# Patient Record
Sex: Female | Born: 1960 | Race: White | Hispanic: No | Smoking: Former smoker
Health system: Southern US, Community
[De-identification: ages and names within clinical notes are randomized; demographics above are authoritative.]

## PROBLEM LIST (undated history)

## (undated) DIAGNOSIS — T8859XA Other complications of anesthesia, initial encounter: Secondary | ICD-10-CM

## (undated) DIAGNOSIS — R112 Nausea with vomiting, unspecified: Secondary | ICD-10-CM

## (undated) DIAGNOSIS — M7138 Other bursal cyst, other site: Secondary | ICD-10-CM

## (undated) DIAGNOSIS — M1990 Unspecified osteoarthritis, unspecified site: Secondary | ICD-10-CM

## (undated) DIAGNOSIS — F419 Anxiety disorder, unspecified: Secondary | ICD-10-CM

## (undated) DIAGNOSIS — R748 Abnormal levels of other serum enzymes: Secondary | ICD-10-CM

## (undated) DIAGNOSIS — J302 Other seasonal allergic rhinitis: Secondary | ICD-10-CM

## (undated) DIAGNOSIS — Z9289 Personal history of other medical treatment: Secondary | ICD-10-CM

## (undated) DIAGNOSIS — I10 Essential (primary) hypertension: Secondary | ICD-10-CM

## (undated) DIAGNOSIS — T4145XA Adverse effect of unspecified anesthetic, initial encounter: Secondary | ICD-10-CM

## (undated) DIAGNOSIS — Z9889 Other specified postprocedural states: Secondary | ICD-10-CM

## (undated) HISTORY — DX: Other bursal cyst, other site: M71.38

## (undated) HISTORY — PX: BUNIONECTOMY: SHX129

## (undated) HISTORY — DX: Anxiety disorder, unspecified: F41.9

## (undated) HISTORY — PX: LIPOSUCTION: SHX10

## (undated) HISTORY — PX: TONSILLECTOMY: SUR1361

## (undated) HISTORY — DX: Personal history of other medical treatment: Z92.89

## (undated) HISTORY — PX: JOINT REPLACEMENT: SHX530

## (undated) HISTORY — PX: OTHER SURGICAL HISTORY: SHX169

## (undated) HISTORY — PX: ABDOMINAL HYSTERECTOMY: SHX81

---

## 1987-06-27 HISTORY — PX: KNEE ARTHROSCOPY: SHX127

## 2006-09-24 ENCOUNTER — Ambulatory Visit: Payer: Self-pay | Admitting: Unknown Physician Specialty

## 2006-09-24 ENCOUNTER — Other Ambulatory Visit: Payer: Self-pay

## 2006-09-27 ENCOUNTER — Ambulatory Visit: Payer: Self-pay | Admitting: Unknown Physician Specialty

## 2008-04-03 ENCOUNTER — Emergency Department: Payer: Self-pay | Admitting: Emergency Medicine

## 2008-10-06 ENCOUNTER — Ambulatory Visit: Payer: Self-pay | Admitting: Unknown Physician Specialty

## 2008-11-05 ENCOUNTER — Ambulatory Visit: Payer: Self-pay | Admitting: Unknown Physician Specialty

## 2008-11-19 ENCOUNTER — Ambulatory Visit: Payer: Self-pay | Admitting: Unknown Physician Specialty

## 2009-05-25 ENCOUNTER — Ambulatory Visit: Payer: Self-pay | Admitting: Orthopedic Surgery

## 2009-06-01 ENCOUNTER — Ambulatory Visit: Payer: Self-pay | Admitting: Orthopedic Surgery

## 2009-09-23 ENCOUNTER — Ambulatory Visit: Payer: Self-pay | Admitting: Orthopedic Surgery

## 2009-10-14 ENCOUNTER — Ambulatory Visit: Payer: Self-pay | Admitting: Unknown Physician Specialty

## 2010-11-07 ENCOUNTER — Ambulatory Visit: Payer: Self-pay | Admitting: Unknown Physician Specialty

## 2011-10-09 ENCOUNTER — Ambulatory Visit: Payer: Self-pay | Admitting: Obstetrics and Gynecology

## 2011-11-12 ENCOUNTER — Emergency Department: Payer: Self-pay | Admitting: Emergency Medicine

## 2012-10-14 NOTE — Progress Notes (Signed)
Need orders for 5-6 surgery pre op is 4-30 0900, thanks

## 2012-10-16 NOTE — Progress Notes (Signed)
MATT-  NEED PRE OP ORDERS PLEASE- appt 10/23/12  THANKS

## 2012-10-17 ENCOUNTER — Ambulatory Visit: Payer: Self-pay | Admitting: Obstetrics and Gynecology

## 2012-10-18 ENCOUNTER — Encounter (HOSPITAL_COMMUNITY): Payer: Self-pay | Admitting: Pharmacy Technician

## 2012-10-23 ENCOUNTER — Ambulatory Visit (HOSPITAL_COMMUNITY)
Admission: RE | Admit: 2012-10-23 | Discharge: 2012-10-23 | Disposition: A | Discharge status: Home / Self Care | Payer: Worker's Compensation | Source: Ambulatory Visit | Attending: Orthopedic Surgery | Admitting: Orthopedic Surgery

## 2012-10-23 ENCOUNTER — Encounter (HOSPITAL_COMMUNITY)
Admission: RE | Admit: 2012-10-23 | Discharge: 2012-10-23 | Disposition: A | Discharge status: Home / Self Care | Payer: Worker's Compensation | Source: Ambulatory Visit | Attending: Orthopedic Surgery | Admitting: Orthopedic Surgery

## 2012-10-23 ENCOUNTER — Encounter (HOSPITAL_COMMUNITY): Payer: Self-pay

## 2012-10-23 DIAGNOSIS — Z01818 Encounter for other preprocedural examination: Secondary | ICD-10-CM | POA: Insufficient documentation

## 2012-10-23 DIAGNOSIS — I10 Essential (primary) hypertension: Secondary | ICD-10-CM | POA: Insufficient documentation

## 2012-10-23 HISTORY — DX: Other seasonal allergic rhinitis: J30.2

## 2012-10-23 HISTORY — DX: Unspecified osteoarthritis, unspecified site: M19.90

## 2012-10-23 HISTORY — DX: Other complications of anesthesia, initial encounter: T88.59XA

## 2012-10-23 HISTORY — DX: Adverse effect of unspecified anesthetic, initial encounter: T41.45XA

## 2012-10-23 LAB — CBC
HCT: 38.6 % (ref 36.0–46.0)
Hemoglobin: 13.1 g/dL (ref 12.0–15.0)
MCH: 31.4 pg (ref 26.0–34.0)
MCHC: 33.9 g/dL (ref 30.0–36.0)
MCV: 92.6 fL (ref 78.0–100.0)
Platelets: 226 10*3/uL (ref 150–400)
RBC: 4.17 MIL/uL (ref 3.87–5.11)
RDW: 13.9 % (ref 11.5–15.5)
WBC: 7.4 10*3/uL (ref 4.0–10.5)

## 2012-10-23 LAB — URINALYSIS, ROUTINE W REFLEX MICROSCOPIC
Bilirubin Urine: NEGATIVE
Glucose, UA: NEGATIVE mg/dL
Hgb urine dipstick: NEGATIVE
Ketones, ur: NEGATIVE mg/dL
Leukocytes, UA: NEGATIVE
Nitrite: NEGATIVE
Protein, ur: NEGATIVE mg/dL
Specific Gravity, Urine: 1.008 (ref 1.005–1.030)
Urobilinogen, UA: 0.2 mg/dL (ref 0.0–1.0)
pH: 7 (ref 5.0–8.0)

## 2012-10-23 LAB — BASIC METABOLIC PANEL
BUN: 10 mg/dL (ref 6–23)
CO2: 25 mEq/L (ref 19–32)
Calcium: 8.8 mg/dL (ref 8.4–10.5)
Chloride: 104 mEq/L (ref 96–112)
Creatinine, Ser: 0.66 mg/dL (ref 0.50–1.10)
GFR calc Af Amer: >90 mL/min (ref >90)
GFR calc non Af Amer: >90 mL/min (ref >90)
Glucose, Bld: 92 mg/dL (ref 70–99)
Potassium: 4.5 mEq/L (ref 3.5–5.1)
Sodium: 138 mEq/L (ref 135–145)

## 2012-10-23 LAB — SURGICAL PCR SCREEN
MRSA, PCR: NEGATIVE
Staphylococcus aureus: NEGATIVE

## 2012-10-23 LAB — PROTIME-INR
INR: 0.92 (ref 0.00–1.49)
Prothrombin Time: 12.3 seconds (ref 11.6–15.2)

## 2012-10-23 LAB — ABO/RH: ABO/RH(D): A POS

## 2012-10-23 LAB — APTT: aPTT: 29 seconds (ref 24–37)

## 2012-10-23 NOTE — H&P (Signed)
TOTAL HIP ADMISSION H&P  Patient is admitted for right total hip arthroplasty, anterior approach.  Subjective:  Chief Complaint: Right hip OA / pain  HPI: Carolyn Wood, 52 y.o. female, has a history of pain and functional disability in the right hip(s) due to trauma and patient has failed non-surgical conservative treatments for greater than 12 weeks to include NSAID's and/or analgesics, corticosteriod injections and activity modification.  Onset of symptoms was abrupt starting September 12, 2011 with a fall off some decking while at work with gradually worsening course since that time.The patient noted no past surgery on the right hip(s).  Patient currently rates pain in the right hip at 7 out of 10 with activity. Patient has night pain, worsening of pain with activity and weight bearing, trendelenberg gait, pain that interfers with activities of daily living and pain with passive range of motion. Patient has evidence of periarticular osteophytes by imaging studies. This condition presents safety issues increasing the risk of falls.  There is no current active infection.  Risks, benefits and expectations were discussed with the patient. Patient understand the risks, benefits and expectations and wishes to proceed with surgery.   D/C Plans:   Home with HHPT  Post-op Meds:   Rx given for ASA, Robaxin, Celebrex, Iron, Colace and MiraLax  Tranexamic Acid:   To be given  Decadron:    To be given  FYI:    Nothing to note   Past Medical History  Diagnosis Date  . Hypertension   . Seasonal allergies   . Arthritis   . Complication of anesthesia     combative when waking up    Past Surgical History  Procedure Laterality Date  . Abdominal hysterectomy    . Bunionectomy Bilateral   . Cesarean section    . Knee arthroscopy Right 1989    "dont know what they did"  . Cyst removed      tail bone  . Tonsillectomy    . Liposuction      on neck and chin  . Chin implant      No  prescriptions prior to admission   No Known Allergies  History  Substance Use Topics  . Smoking status: Current Some Day Smoker -- 0.50 packs/day for 20 years    Types: Cigarettes, Cigars  . Smokeless tobacco: Never Used  . Alcohol Use: Yes    No family history on file.   Review of Systems  Constitutional: Negative.   HENT: Negative.   Eyes: Negative.   Respiratory: Negative.   Cardiovascular: Negative.   Gastrointestinal: Negative.   Genitourinary: Negative.   Musculoskeletal: Positive for joint pain.  Skin: Negative.   Neurological: Negative.   Endo/Heme/Allergies: Positive for environmental allergies.  Psychiatric/Behavioral: Negative.     Objective:  Physical Exam  Constitutional: She is oriented to person, place, and time. She appears well-developed and well-nourished.  HENT:  Head: Normocephalic and atraumatic.  Mouth/Throat: Oropharynx is clear and moist.  Eyes: Pupils are equal, round, and reactive to light.  Neck: Neck supple. No JVD present. No tracheal deviation present. No thyromegaly present.  Cardiovascular: Normal rate, regular rhythm, normal heart sounds and intact distal pulses.   Respiratory: Effort normal and breath sounds normal. No stridor. No respiratory distress. She has no wheezes.  GI: Soft. There is no tenderness. There is no guarding.  Musculoskeletal:       Right hip: She exhibits decreased range of motion, decreased strength, tenderness and bony tenderness. She exhibits no swelling, no  deformity and no laceration.  Lymphadenopathy:    She has no cervical adenopathy.  Neurological: She is alert and oriented to person, place, and time.  Skin: Skin is warm and dry.  Psychiatric: She has a normal mood and affect.    Vital signs in last 24 hours: Temp:  [97.9 F (36.6 C)] 97.9 F (36.6 C) (04/30 0943) Pulse Rate:  [70] 70 (04/30 0943) Resp:  [16] 16 (04/30 0943) BP: (133)/(81) 133/81 mmHg (04/30 0943) SpO2:  [98 %] 98 % (04/30  0943) Weight:  [72.576 kg (160 lb)] 72.576 kg (160 lb) (04/30 0943)   Imaging Review Plain radiographs demonstrate moderate degenerative joint disease of the right hip(s). The bone quality appears to be good for age and reported activity level.  Assessment/Plan:  End stage arthritis, right hip(s)  The patient history, physical examination, clinical judgement of the provider and imaging studies are consistent with end stage degenerative joint disease of the right hip(s) and total hip arthroplasty is deemed medically necessary. The treatment options including medical management, injection therapy, arthroscopy and arthroplasty were discussed at length. The risks and benefits of total hip arthroplasty were presented and reviewed. The risks due to aseptic loosening, infection, stiffness, dislocation/subluxation,  thromboembolic complications and other imponderables were discussed.  The patient acknowledged the explanation, agreed to proceed with the plan and consent was signed. Patient is being admitted for inpatient treatment for surgery, pain control, PT, OT, prophylactic antibiotics, VTE prophylaxis, progressive ambulation and ADL's and discharge planning.The patient is planning to be discharged home with home health services.    Anastasio Auerbach Eryck Negron   PAC  10/23/2012, 5:10 PM

## 2012-10-23 NOTE — Patient Instructions (Addendum)
20 Shoni Quijas  10/23/2012   Your procedure is scheduled on: 10/29/12  Report to [facility] at 1030 AM.  Call this number if you have problems the morning of surgery [phone]: [phone]   Remember:   Do not eat food or drink liquids After Midnight.   Do not wear jewelry, make-up or nail polish.  Do not wear lotions, powders, or perfumes. You may wear deodorant.  Do not shave 48 hours prior to surgery. Men may shave face and neck.  Do not bring valuables to the hospital.  Contacts, dentures or bridgework may not be worn into surgery.  Leave suitcase in the car. After surgery it may be brought to your room.  For patients admitted to the hospital, checkout time is 11:00 AM the day of discharge.    Please read over the following fact sheets that you were given: MRSA Information, incentive spirometry fact sheet, blood fact sheet.  Birdie Sons, RN  pre op nurse call if needed [phone]    FAILURE TO FOLLOW THESE INSTRUCTIONS MAY RESULT IN CANCELLATION OF YOUR SURGERY   Patient Signature: ___________________________________________

## 2012-10-23 NOTE — Progress Notes (Signed)
Anesthesia record 06/01/09 on chart, perioperative report 06/01/09 on chart, EKG 10/15/12 on chart, LOV note 10/15/12 Dr. Dossie Arbour on chart

## 2012-10-29 ENCOUNTER — Encounter (HOSPITAL_COMMUNITY)
Admission: RE | Disposition: A | Discharge status: Home / Self Care | Payer: Self-pay | Source: Ambulatory Visit | Attending: Orthopedic Surgery

## 2012-10-29 ENCOUNTER — Encounter (HOSPITAL_COMMUNITY): Payer: Self-pay | Admitting: *Deleted

## 2012-10-29 ENCOUNTER — Ambulatory Visit (HOSPITAL_COMMUNITY): Payer: Worker's Compensation

## 2012-10-29 ENCOUNTER — Encounter (HOSPITAL_COMMUNITY): Payer: Self-pay | Admitting: Anesthesiology

## 2012-10-29 ENCOUNTER — Inpatient Hospital Stay (HOSPITAL_COMMUNITY)
Admission: RE | Admit: 2012-10-29 | Discharge: 2012-10-30 | DRG: 470 | Disposition: A | Discharge status: Home / Self Care | Payer: Worker's Compensation | Source: Ambulatory Visit | Attending: Orthopedic Surgery | Admitting: Orthopedic Surgery

## 2012-10-29 ENCOUNTER — Ambulatory Visit (HOSPITAL_COMMUNITY): Payer: Worker's Compensation | Admitting: Anesthesiology

## 2012-10-29 DIAGNOSIS — F17200 Nicotine dependence, unspecified, uncomplicated: Secondary | ICD-10-CM | POA: Diagnosis present

## 2012-10-29 DIAGNOSIS — I10 Essential (primary) hypertension: Secondary | ICD-10-CM | POA: Diagnosis present

## 2012-10-29 DIAGNOSIS — D62 Acute posthemorrhagic anemia: Secondary | ICD-10-CM | POA: Diagnosis not present

## 2012-10-29 DIAGNOSIS — M1610 Unilateral primary osteoarthritis, unspecified hip: Principal | ICD-10-CM | POA: Diagnosis present

## 2012-10-29 DIAGNOSIS — M76899 Other specified enthesopathies of unspecified lower limb, excluding foot: Secondary | ICD-10-CM | POA: Diagnosis present

## 2012-10-29 DIAGNOSIS — W1789XA Other fall from one level to another, initial encounter: Secondary | ICD-10-CM | POA: Diagnosis present

## 2012-10-29 DIAGNOSIS — Y9289 Other specified places as the place of occurrence of the external cause: Secondary | ICD-10-CM

## 2012-10-29 DIAGNOSIS — D500 Iron deficiency anemia secondary to blood loss (chronic): Secondary | ICD-10-CM

## 2012-10-29 DIAGNOSIS — Z96649 Presence of unspecified artificial hip joint: Secondary | ICD-10-CM

## 2012-10-29 DIAGNOSIS — Y990 Civilian activity done for income or pay: Secondary | ICD-10-CM

## 2012-10-29 DIAGNOSIS — M169 Osteoarthritis of hip, unspecified: Principal | ICD-10-CM | POA: Diagnosis present

## 2012-10-29 HISTORY — PX: TOTAL HIP ARTHROPLASTY: SHX124

## 2012-10-29 LAB — TYPE AND SCREEN
ABO/RH(D): A POS
Antibody Screen: NEGATIVE

## 2012-10-29 SURGERY — ARTHROPLASTY, HIP, TOTAL, ANTERIOR APPROACH
Anesthesia: General | Site: Hip | Laterality: Right | Wound class: Clean

## 2012-10-29 MED ORDER — HYDROMORPHONE HCL PF 1 MG/ML IJ SOLN
INTRAMUSCULAR | Status: DC | PRN
  Administered 2012-10-29 (×2): 1 mg via INTRAVENOUS

## 2012-10-29 MED ORDER — PHENYLEPHRINE HCL 1 % NA SOLN
1.0000 [drp] | NASAL | Status: DC | PRN
  Filled 2012-10-29: qty 15

## 2012-10-29 MED ORDER — MENTHOL 3 MG MT LOZG: 1.0000 | LOZENGE | OROMUCOSAL | Status: DC | PRN

## 2012-10-29 MED ORDER — HYDROMORPHONE HCL PF 1 MG/ML IJ SOLN
INTRAMUSCULAR | Status: AC
  Filled 2012-10-29: qty 1

## 2012-10-29 MED ORDER — FERROUS SULFATE 325 (65 FE) MG PO TABS
325.0000 mg | ORAL_TABLET | Freq: Three times a day (TID) | ORAL | Status: DC
  Administered 2012-10-30: 325 mg via ORAL
  Filled 2012-10-29 (×5): qty 1

## 2012-10-29 MED ORDER — DEXAMETHASONE SODIUM PHOSPHATE 10 MG/ML IJ SOLN
10.0000 mg | Freq: Once | INTRAMUSCULAR | Status: DC
  Filled 2012-10-29: qty 1

## 2012-10-29 MED ORDER — HYDROMORPHONE HCL PF 1 MG/ML IJ SOLN: 0.5000 mg | INTRAMUSCULAR | Status: DC | PRN

## 2012-10-29 MED ORDER — CEFAZOLIN SODIUM-DEXTROSE 2-3 GM-% IV SOLR
2.0000 g | Freq: Four times a day (QID) | INTRAVENOUS | Status: AC
  Administered 2012-10-29 – 2012-10-30 (×2): 2 g via INTRAVENOUS
  Filled 2012-10-29 (×2): qty 50

## 2012-10-29 MED ORDER — METOCLOPRAMIDE HCL 5 MG/ML IJ SOLN: 5.0000 mg | Freq: Three times a day (TID) | INTRAMUSCULAR | Status: DC | PRN

## 2012-10-29 MED ORDER — METHOCARBAMOL 500 MG PO TABS: 500.0000 mg | ORAL_TABLET | Freq: Four times a day (QID) | ORAL | Status: DC | PRN

## 2012-10-29 MED ORDER — FENTANYL CITRATE 0.05 MG/ML IJ SOLN
INTRAMUSCULAR | Status: DC | PRN
  Administered 2012-10-29 (×2): 50 ug via INTRAVENOUS
  Administered 2012-10-29: 100 ug via INTRAVENOUS
  Administered 2012-10-29: 50 ug via INTRAVENOUS

## 2012-10-29 MED ORDER — PROMETHAZINE HCL 25 MG/ML IJ SOLN
INTRAMUSCULAR | Status: AC
  Filled 2012-10-29: qty 1

## 2012-10-29 MED ORDER — DOCUSATE SODIUM 100 MG PO CAPS
100.0000 mg | ORAL_CAPSULE | Freq: Two times a day (BID) | ORAL | Status: DC
  Administered 2012-10-29 – 2012-10-30 (×2): 100 mg via ORAL

## 2012-10-29 MED ORDER — SODIUM CHLORIDE 0.9 % IV SOLN
100.0000 mL/h | INTRAVENOUS | Status: DC
  Administered 2012-10-29: 100 mL/h via INTRAVENOUS
  Filled 2012-10-29 (×5): qty 1000

## 2012-10-29 MED ORDER — LACTATED RINGERS IV SOLN: INTRAVENOUS | Status: DC

## 2012-10-29 MED ORDER — ZOLPIDEM TARTRATE 5 MG PO TABS: 5.0000 mg | ORAL_TABLET | Freq: Every evening | ORAL | Status: DC | PRN

## 2012-10-29 MED ORDER — PROPOFOL 10 MG/ML IV BOLUS
INTRAVENOUS | Status: DC | PRN
  Administered 2012-10-29: 160 mg via INTRAVENOUS
  Administered 2012-10-29: 40 mg via INTRAVENOUS

## 2012-10-29 MED ORDER — ASPIRIN EC 325 MG PO TBEC
325.0000 mg | DELAYED_RELEASE_TABLET | Freq: Two times a day (BID) | ORAL | Status: DC
  Administered 2012-10-30: 325 mg via ORAL
  Filled 2012-10-29 (×2): qty 1

## 2012-10-29 MED ORDER — LACTATED RINGERS IV SOLN
INTRAVENOUS | Status: DC
  Administered 2012-10-29 (×2): via INTRAVENOUS
  Administered 2012-10-29: 1000 mL via INTRAVENOUS
  Administered 2012-10-29: 16:00:00 via INTRAVENOUS

## 2012-10-29 MED ORDER — LACTATED RINGERS IV SOLN: INTRAVENOUS | Status: DC | PRN

## 2012-10-29 MED ORDER — CLONAZEPAM 0.5 MG PO TABS: 0.2500 mg | ORAL_TABLET | Freq: Two times a day (BID) | ORAL | Status: DC | PRN

## 2012-10-29 MED ORDER — TRANEXAMIC ACID 100 MG/ML IV SOLN
1000.0000 mg | Freq: Once | INTRAVENOUS | Status: AC
  Administered 2012-10-29: 1000 mg via INTRAVENOUS
  Filled 2012-10-29: qty 10

## 2012-10-29 MED ORDER — CEFAZOLIN SODIUM-DEXTROSE 2-3 GM-% IV SOLR
2.0000 g | INTRAVENOUS | Status: AC
  Administered 2012-10-29: 2 g via INTRAVENOUS

## 2012-10-29 MED ORDER — BISACODYL 10 MG RE SUPP: 10.0000 mg | Freq: Every day | RECTAL | Status: DC | PRN

## 2012-10-29 MED ORDER — 0.9 % SODIUM CHLORIDE (POUR BTL) OPTIME
TOPICAL | Status: DC | PRN
  Administered 2012-10-29: 1000 mL

## 2012-10-29 MED ORDER — ONDANSETRON HCL 4 MG/2ML IJ SOLN
4.0000 mg | Freq: Four times a day (QID) | INTRAMUSCULAR | Status: DC | PRN
  Administered 2012-10-29: 4 mg via INTRAVENOUS
  Filled 2012-10-29: qty 2

## 2012-10-29 MED ORDER — LIDOCAINE HCL (CARDIAC) 20 MG/ML IV SOLN
INTRAVENOUS | Status: DC | PRN
  Administered 2012-10-29: 60 mg via INTRAVENOUS

## 2012-10-29 MED ORDER — ACETAMINOPHEN 10 MG/ML IV SOLN
INTRAVENOUS | Status: AC
  Filled 2012-10-29: qty 100

## 2012-10-29 MED ORDER — PROMETHAZINE HCL 25 MG/ML IJ SOLN
6.2500 mg | INTRAMUSCULAR | Status: DC | PRN
  Administered 2012-10-29: 6.25 mg via INTRAVENOUS

## 2012-10-29 MED ORDER — DIPHENHYDRAMINE HCL 25 MG PO CAPS: 25.0000 mg | ORAL_CAPSULE | Freq: Four times a day (QID) | ORAL | Status: DC | PRN

## 2012-10-29 MED ORDER — ROCURONIUM BROMIDE 100 MG/10ML IV SOLN
INTRAVENOUS | Status: DC | PRN
  Administered 2012-10-29: 20 mg via INTRAVENOUS
  Administered 2012-10-29: 50 mg via INTRAVENOUS

## 2012-10-29 MED ORDER — PHENYLEPHRINE HCL 10 MG/ML IJ SOLN
INTRAMUSCULAR | Status: DC | PRN
  Administered 2012-10-29 (×5): 40 ug via INTRAVENOUS

## 2012-10-29 MED ORDER — METOCLOPRAMIDE HCL 5 MG PO TABS
5.0000 mg | ORAL_TABLET | Freq: Three times a day (TID) | ORAL | Status: DC | PRN
  Filled 2012-10-29: qty 2

## 2012-10-29 MED ORDER — HYDROCODONE-ACETAMINOPHEN 7.5-325 MG PO TABS
1.0000 | ORAL_TABLET | ORAL | Status: DC
  Administered 2012-10-29 – 2012-10-30 (×3): 2 via ORAL
  Administered 2012-10-30: 1 via ORAL
  Administered 2012-10-30: 2 via ORAL
  Filled 2012-10-29 (×5): qty 2

## 2012-10-29 MED ORDER — ALUM & MAG HYDROXIDE-SIMETH 200-200-20 MG/5ML PO SUSP: 30.0000 mL | ORAL | Status: DC | PRN

## 2012-10-29 MED ORDER — NEOSTIGMINE METHYLSULFATE 1 MG/ML IJ SOLN
INTRAMUSCULAR | Status: DC | PRN
  Administered 2012-10-29: 2 mg via INTRAVENOUS

## 2012-10-29 MED ORDER — METHOCARBAMOL 100 MG/ML IJ SOLN
500.0000 mg | Freq: Four times a day (QID) | INTRAVENOUS | Status: DC | PRN
  Administered 2012-10-29: 500 mg via INTRAVENOUS
  Filled 2012-10-29: qty 5

## 2012-10-29 MED ORDER — CELECOXIB 200 MG PO CAPS
200.0000 mg | ORAL_CAPSULE | Freq: Two times a day (BID) | ORAL | Status: DC
  Administered 2012-10-29 – 2012-10-30 (×2): 200 mg via ORAL
  Filled 2012-10-29 (×3): qty 1

## 2012-10-29 MED ORDER — CEFAZOLIN SODIUM-DEXTROSE 2-3 GM-% IV SOLR
INTRAVENOUS | Status: AC
  Filled 2012-10-29: qty 50

## 2012-10-29 MED ORDER — FLEET ENEMA 7-19 GM/118ML RE ENEM: 1.0000 | ENEMA | Freq: Once | RECTAL | Status: AC | PRN

## 2012-10-29 MED ORDER — POLYETHYLENE GLYCOL 3350 17 G PO PACK
17.0000 g | PACK | Freq: Two times a day (BID) | ORAL | Status: DC
  Administered 2012-10-29 – 2012-10-30 (×2): 17 g via ORAL

## 2012-10-29 MED ORDER — GLYCOPYRROLATE 0.2 MG/ML IJ SOLN
INTRAMUSCULAR | Status: DC | PRN
  Administered 2012-10-29 (×2): 0.2 mg via INTRAVENOUS

## 2012-10-29 MED ORDER — MIDAZOLAM HCL 5 MG/5ML IJ SOLN
INTRAMUSCULAR | Status: DC | PRN
  Administered 2012-10-29: 2 mg via INTRAVENOUS

## 2012-10-29 MED ORDER — HYDROMORPHONE HCL PF 1 MG/ML IJ SOLN
0.2500 mg | INTRAMUSCULAR | Status: DC | PRN
Start: 2012-10-29 — End: 2012-10-29
  Administered 2012-10-29 (×2): 0.5 mg via INTRAVENOUS

## 2012-10-29 MED ORDER — ONDANSETRON HCL 4 MG PO TABS: 4.0000 mg | ORAL_TABLET | Freq: Four times a day (QID) | ORAL | Status: DC | PRN

## 2012-10-29 MED ORDER — DEXAMETHASONE SODIUM PHOSPHATE 10 MG/ML IJ SOLN
10.0000 mg | Freq: Once | INTRAMUSCULAR | Status: AC
  Administered 2012-10-29: 10 mg via INTRAVENOUS

## 2012-10-29 MED ORDER — ONDANSETRON HCL 4 MG/2ML IJ SOLN
INTRAMUSCULAR | Status: DC | PRN
  Administered 2012-10-29: 4 mg via INTRAVENOUS

## 2012-10-29 MED ORDER — ACETAMINOPHEN 10 MG/ML IV SOLN
INTRAVENOUS | Status: DC | PRN
  Administered 2012-10-29: 1000 mg via INTRAVENOUS

## 2012-10-29 MED ORDER — PHENOL 1.4 % MT LIQD: 1.0000 | OROMUCOSAL | Status: DC | PRN

## 2012-10-29 SURGICAL SUPPLY — 38 items
BAG ZIPLOCK 12X15 (MISCELLANEOUS) ×4 IMPLANT
BLADE SAW SGTL 18X1.27X75 (BLADE) ×2 IMPLANT
CLOTH BEACON ORANGE TIMEOUT ST (SAFETY) ×2 IMPLANT
DERMABOND ADVANCED (GAUZE/BANDAGES/DRESSINGS) ×1
DERMABOND ADVANCED .7 DNX12 (GAUZE/BANDAGES/DRESSINGS) ×1 IMPLANT
DRAPE C-ARM 42X72 X-RAY (DRAPES) ×2 IMPLANT
DRAPE STERI IOBAN 125X83 (DRAPES) ×2 IMPLANT
DRAPE U-SHAPE 47X51 STRL (DRAPES) ×6 IMPLANT
DRSG AQUACEL AG ADV 3.5X10 (GAUZE/BANDAGES/DRESSINGS) ×2 IMPLANT
DRSG TEGADERM 4X4.75 (GAUZE/BANDAGES/DRESSINGS) ×2 IMPLANT
DURAPREP 26ML APPLICATOR (WOUND CARE) ×2 IMPLANT
ELECT BLADE TIP CTD 4 INCH (ELECTRODE) ×2 IMPLANT
ELECT REM PT RETURN 9FT ADLT (ELECTROSURGICAL) ×2
ELECTRODE REM PT RTRN 9FT ADLT (ELECTROSURGICAL) ×1 IMPLANT
EVACUATOR 1/8 PVC DRAIN (DRAIN) IMPLANT
FACESHIELD LNG OPTICON STERILE (SAFETY) ×8 IMPLANT
GAUZE SPONGE 2X2 8PLY STRL LF (GAUZE/BANDAGES/DRESSINGS) ×1 IMPLANT
GLOVE BIOGEL PI IND STRL 7.5 (GLOVE) ×1 IMPLANT
GLOVE BIOGEL PI IND STRL 8 (GLOVE) ×1 IMPLANT
GLOVE BIOGEL PI INDICATOR 7.5 (GLOVE) ×1
GLOVE BIOGEL PI INDICATOR 8 (GLOVE) ×1
GLOVE ECLIPSE 8.0 STRL XLNG CF (GLOVE) ×2 IMPLANT
GLOVE ORTHO TXT STRL SZ7.5 (GLOVE) ×4 IMPLANT
GOWN BRE IMP PREV XXLGXLNG (GOWN DISPOSABLE) ×2 IMPLANT
GOWN STRL NON-REIN LRG LVL3 (GOWN DISPOSABLE) ×2 IMPLANT
KIT BASIN OR (CUSTOM PROCEDURE TRAY) ×2 IMPLANT
PACK TOTAL JOINT (CUSTOM PROCEDURE TRAY) ×2 IMPLANT
PADDING CAST COTTON 6X4 STRL (CAST SUPPLIES) ×2 IMPLANT
SPONGE GAUZE 2X2 STER 10/PKG (GAUZE/BANDAGES/DRESSINGS) ×1
SUCTION FRAZIER 12FR DISP (SUCTIONS) ×2 IMPLANT
SUT MNCRL AB 4-0 PS2 18 (SUTURE) ×2 IMPLANT
SUT VIC AB 1 CT1 36 (SUTURE) ×8 IMPLANT
SUT VIC AB 2-0 CT1 27 (SUTURE) ×2
SUT VIC AB 2-0 CT1 TAPERPNT 27 (SUTURE) ×2 IMPLANT
SUT VLOC 180 0 24IN GS25 (SUTURE) ×2 IMPLANT
TOWEL OR 17X26 10 PK STRL BLUE (TOWEL DISPOSABLE) ×4 IMPLANT
TRAY FOLEY CATH 14FRSI W/METER (CATHETERS) ×2 IMPLANT
WATER STERILE IRR 1500ML POUR (IV SOLUTION) ×4 IMPLANT

## 2012-10-29 NOTE — Transfer of Care (Signed)
Immediate Anesthesia Transfer of Care Note  [name]  Procedure(s) Performed: Procedure(s): RIGHT TOTAL HIP ARTHROPLASTY ANTERIOR APPROACH (Right)  Patient Location: PACU  Anesthesia Type:General  Level of Consciousness: awake, alert  and oriented  Airway & Oxygen Therapy: Patient Spontanous Breathing and Patient connected to face mask oxygen  Post-op Assessment: Report given to PACU RN, Post -op Vital signs reviewed and stable and Patient moving all extremities X 4  Post vital signs: Reviewed and stable  Complications: No apparent anesthesia complications

## 2012-10-29 NOTE — Care Management Note (Addendum)
    Page 1 of 2   10/30/2012     1:46:59 PM   CARE MANAGEMENT NOTE 10/30/2012  Patient:  [facility]   Account Number:  [id number]  Date Initiated:  10/29/2012  Documentation initiated by:  Colleen Can  Subjective/Objective Assessment:   DX TOTAL HIP REPLACEMNT-RT-ANTERIOR APPROACH  Worker's cpm [id number]  doi-11/12/2011  Delaney Meigs OZHYQM-[phone]     Action/Plan:   CM WILL FOLLOW; PT PENDING   Anticipated DC Date:  11/01/2012   Anticipated DC Plan:  HOME W HOME HEALTH SERVICES      DC Planning Services  CM consult      PAC Choice  DURABLE MEDICAL EQUIPMENT  HOME HEALTH   Choice offered to / List presented to:  C-1 Patient   DME arranged  WALKER - ROLLING      DME agency  OTHER - SEE NOTE     HH arranged  HH-2 PT      HH agency  OTHER - SEE NOTE   Status of service:  Completed, signed off Medicare Important Message given?   (If response is "NO", the following Medicare IM given date fields will be blank) Date Medicare IM given:   Date Additional Medicare IM given:    Discharge Disposition:  HOME W HOME HEALTH SERVICES  Per UR Regulation:  Reviewed for med. necessity/level of care/duration of stay  If discussed at Long Length of Stay Meetings, dates discussed:    Comments:  10/30/2012 Colleen Can BSN RN CCM [phone] No return call has been received from [place]-adjuster. left msg. Pt is requesting to leave, states she can borrow walker from relative. Cm will continue to try to reach adjuster to advise of pt's H and dme needs. CM will contact pt and advise. tct made to toll free customer service [phone]; spoke with Rosanne Ashing who was advised of pt's need for HHpt and RW. Advised that orders were stat. Request was made for orders to be sent to fax-[phone], face sheet, op note and H&P also sent. Customer service-Jim states patient would be called after services-HH/DME setup. Pt's home address and phone number  verified.  10/30/2012 Colleen Can BSN RN CCM [phone] CM spoke with patient and current plans are for patient to return to her home in [place] [facility] where spouse will be caregiver. She states this is a Freight forwarder. Pt will require HHpt and RW. TCT made to [facility]-claims adjuster-[phone] [phone]-msg left on voice mail REQUESTING DME AND HH NEEDS. CM WILL FOLLOW.

## 2012-10-29 NOTE — Preoperative (Signed)
Beta Blockers   Reason not to administer Beta Blockers:Not Applicable 

## 2012-10-29 NOTE — Op Note (Signed)
NAME:  Carolyn Wood                ACCOUNT NO.: [id number]      MEDICAL RECORD NO.: [id number]      FACILITY:  [facility]      PHYSICIAN:  Durene Romans D  DATE OF BIRTH:  10-Jan-1961     DATE OF PROCEDURE:  10/29/2012                                 OPERATIVE REPORT         PREOPERATIVE DIAGNOSIS: Right  hip osteoarthritis.      POSTOPERATIVE DIAGNOSIS:  Right hip osteoarthritis.      PROCEDURE:  Right total hip replacement through an anterior approach   utilizing DePuy THR system, component size 52mm pinnacle cup, a size 36+4 neutral   Altrex liner, a size 3 Hi Tri Lock stem with a 36+1.5 delta ceramic   ball.      SURGEON:  Madlyn Frankel. Charlann Boxer, M.D.      ASSISTANT:  Lanney Gins, PA-C     ANESTHESIA:  General.      SPECIMENS:  None.      COMPLICATIONS:  None.      BLOOD LOSS:  250 cc     DRAINS:  One Hemovac.      INDICATION OF THE PROCEDURE:  Carolyn Wood is a 51 y.o. female who had   presented to office for evaluation of right hip pain.  Radiographs revealed   progressive degenerative changes with bone-on-bone   articulation to the  hip joint.  The patient had painful limited range of   motion significantly affecting their overall quality of life.  The patient was failing to    respond to conservative measures, and at this point was ready   to proceed with more definitive measures.  The patient has noted progressive   degenerative changes in his hip, progressive problems and dysfunction   with regarding the hip prior to surgery.  Consent was obtained for   benefit of pain relief.  Specific risk of infection, DVT, component   failure, dislocation, need for revision surgery, as well discussion of   the anterior versus posterior approach were reviewed.  Consent was   obtained for benefit of anterior pain relief through an anterior   approach.      PROCEDURE IN DETAIL:  The patient was brought to operative theater.   Once adequate  anesthesia, preoperative antibiotics, 2gm Ancef administered.   The patient was positioned supine on the OSI Hanna table.  Once adequate   padding of boney process was carried out, we had predraped out the hip, and  used fluoroscopy to confirm orientation of the pelvis and position.      The right hip was then prepped and draped from proximal iliac crest to   mid thigh with shower curtain technique.      Time-out was performed identifying the patient, planned procedure, and   extremity.     An incision was then made 2 cm distal and lateral to the   anterior superior iliac spine extending over the orientation of the   tensor fascia lata muscle and sharp dissection was carried down to the   fascia of the muscle and protractor placed in the soft tissues.      The fascia was then incised.  The muscle belly was identified and swept  laterally and retractor placed along the superior neck.  Following   cauterization of the circumflex vessels and removing some pericapsular   fat, a second cobra retractor was placed on the inferior neck.  A third   retractor was placed on the anterior acetabulum after elevating the   anterior rectus.  A L-capsulotomy was along the line of the   superior neck to the trochanteric fossa, then extended proximally and   distally.  Tag sutures were placed and the retractors were then placed   intracapsular.  We then identified the trochanteric fossa and   orientation of my neck cut, confirmed this radiographically   and then made a neck osteotomy with the femur on traction.  The femoral   head was removed without difficulty or complication.  Traction was let   off and retractors were placed posterior and anterior around the   acetabulum.      The labrum and foveal tissue were debrided.  I began reaming with a 47mm   reamer and reamed up to 51mm reamer with good bony bed preparation and a 52   cup was chosen.  The final 52mm Pinnacle cup was then impacted under  fluoroscopy  to confirm the depth of penetration and orientation with respect to   abduction.  A screw was placed followed by the hole eliminator.  The final   36+4 neutral Altrex liner was impacted with good visualized rim fit.  The cup was positioned anatomically within the acetabular portion of the pelvis.      At this point, the femur was rolled at 80 degrees.  Further capsule was   released off the inferior aspect of the femoral neck.  I then   released the superior capsule proximally.  The hook was placed laterally   along the femur and elevated manually and held in position with the bed   hook.  The leg was then extended and adducted with the leg rolled to 100   degrees of external rotation.  Once the proximal femur was fully   exposed, I used a box osteotome to set orientation.  I then began   broaching with the starting chili pepper broach and passed this by hand and then broached up to 3.  With the 3 broach in place I chose a high offset neck and did several trial reductions and readjustments to match leg length and offset.  With the 3 broach basically sitting at the level of the lesser trochanter with a high offset neck and the 36+1.5 ball the offset was appropriate, leg lengths   appeared to be equal, confirmed radiographically.   Given these findings, I went ahead and dislocated the hip, repositioned all   retractors and positioned the right hip in the extended and abducted position.  The final 3HiTri Lock stem was   chosen and it was impacted down to the level of neck cut.  Based on this   and the trial reduction, a 36+1.5 delta ceramic ball was chosen and   impacted onto a clean and dry trunnion, and the hip was reduced.  The   hip had been irrigated throughout the case again at this point.  I did   reapproximate the superior capsular leaflet to the anterior leaflet   using #1 Vicryl, placed a medium Hemovac drain deep.  The fascia of the   tensor fascia lata muscle was then  reapproximated using #1 Vicryl.  The   remaining wound was closed with 2-0 Vicryl and  running 4-0 Monocryl.   The hip was cleaned, dried, and dressed sterilely using Dermabond and   Aquacel dressing.  Drain site dressed separately.  She was then brought   to recovery room in stable condition tolerating the procedure well.    Lanney Gins, PA-C was present for the entirety of the case involved from   preoperative positioning, perioperative retractor management, general   facilitation of the case, as well as primary wound closure as assistant.            Madlyn Frankel Charlann Boxer, M.D.            MDO/MEDQ  D:  04/18/2011  T:  04/18/2011  Job:  [id number]      Electronically Signed by Durene Romans M.D. on 04/24/2011 09:15:38 AM

## 2012-10-29 NOTE — Anesthesia Preprocedure Evaluation (Addendum)
Anesthesia Evaluation  Patient identified by MRN, date of birth, ID band Patient awake    Reviewed: Allergy & Precautions, H&P , NPO status , Patient's Chart, lab work & pertinent test results  History of Anesthesia Complications (+) Emergence Delirium  Airway Mallampati: II TM Distance: >3 FB Neck ROM: Full    Dental  (+) Teeth Intact and Dental Advisory Given   Pulmonary Current Smoker,  breath sounds clear to auscultation  Pulmonary exam normal       Cardiovascular hypertension, Rhythm:Regular Rate:Normal     Neuro/Psych negative neurological ROS  negative psych ROS   GI/Hepatic negative GI ROS, Neg liver ROS,   Endo/Other  negative endocrine ROS  Renal/GU negative Renal ROS  negative genitourinary   Musculoskeletal negative musculoskeletal ROS (+)   Abdominal   Peds  Hematology negative hematology ROS (+)   Anesthesia Other Findings   Reproductive/Obstetrics                          Anesthesia Physical Anesthesia Plan  ASA: II  Anesthesia Plan: General   Post-op Pain Management:    Induction: Intravenous  Airway Management Planned: Oral ETT  Additional Equipment:   Intra-op Plan:   Post-operative Plan: Extubation in OR  Informed Consent: I have reviewed the patients History and Physical, chart, labs and discussed the procedure including the risks, benefits and alternatives for the proposed anesthesia with the patient or authorized representative who has indicated his/her understanding and acceptance.   Dental advisory given  Plan Discussed with: CRNA  Anesthesia Plan Comments:         Anesthesia Quick Evaluation

## 2012-10-29 NOTE — Anesthesia Postprocedure Evaluation (Signed)
Anesthesia Post Note  [name]  Procedure(s) Performed: Procedure(s) (LRB): RIGHT TOTAL HIP ARTHROPLASTY ANTERIOR APPROACH (Right)  Anesthesia type: General  Patient location: PACU  Post pain: Pain level controlled  Post assessment: Post-op Vital signs reviewed  Last Vitals:  Filed Vitals:   10/29/12 1623  BP: 119/82  Pulse: 72  Temp: 36.6 C  Resp: 15    Post vital signs: Reviewed  Level of consciousness: sedated  Complications: No apparent anesthesia complications

## 2012-10-29 NOTE — Interval H&P Note (Signed)
History and Physical Interval Note:  10/29/2012 11:24 AM  Carolyn Wood  has presented today for surgery, with the diagnosis of RIGHT HIP OSTEOARTHRITIS   The various methods of treatment have been discussed with the patient and family. After consideration of risks, benefits and other options for treatment, the patient has consented to  Procedure(s): RIGHT TOTAL HIP ARTHROPLASTY ANTERIOR APPROACH (Right) as a surgical intervention .  The patient's history has been reviewed, patient examined, no change in status, stable for surgery.  I have reviewed the patient's chart and labs.  Questions were answered to the patient's satisfaction.     Shelda Pal

## 2012-10-30 ENCOUNTER — Encounter (HOSPITAL_COMMUNITY): Payer: Self-pay | Admitting: Orthopedic Surgery

## 2012-10-30 DIAGNOSIS — D500 Iron deficiency anemia secondary to blood loss (chronic): Secondary | ICD-10-CM

## 2012-10-30 LAB — CBC
HCT: 30.9 % — ABNORMAL LOW (ref 36.0–46.0)
Hemoglobin: 9.8 g/dL — ABNORMAL LOW (ref 12.0–15.0)
MCH: 30 pg (ref 26.0–34.0)
MCHC: 31.7 g/dL (ref 30.0–36.0)
MCV: 94.5 fL (ref 78.0–100.0)
Platelets: 183 10*3/uL (ref 150–400)
RBC: 3.27 MIL/uL — ABNORMAL LOW (ref 3.87–5.11)
RDW: 13.9 % (ref 11.5–15.5)
WBC: 11.4 10*3/uL — ABNORMAL HIGH (ref 4.0–10.5)

## 2012-10-30 LAB — BASIC METABOLIC PANEL
BUN: 9 mg/dL (ref 6–23)
CO2: 27 mEq/L (ref 19–32)
Calcium: 8.2 mg/dL — ABNORMAL LOW (ref 8.4–10.5)
Chloride: 105 mEq/L (ref 96–112)
Creatinine, Ser: 0.6 mg/dL (ref 0.50–1.10)
GFR calc Af Amer: >90 mL/min (ref >90)
GFR calc non Af Amer: >90 mL/min (ref >90)
Glucose, Bld: 127 mg/dL — ABNORMAL HIGH (ref 70–99)
Potassium: 4.2 mEq/L (ref 3.5–5.1)
Sodium: 136 mEq/L (ref 135–145)

## 2012-10-30 MED ORDER — FERROUS SULFATE 325 (65 FE) MG PO TABS: 325.0000 mg | ORAL_TABLET | Freq: Three times a day (TID) | ORAL | Status: DC

## 2012-10-30 MED ORDER — DSS 100 MG PO CAPS: 100.0000 mg | ORAL_CAPSULE | Freq: Two times a day (BID) | ORAL | Status: DC

## 2012-10-30 MED ORDER — HYDROCODONE-ACETAMINOPHEN 7.5-325 MG PO TABS: 1.0000 | ORAL_TABLET | ORAL | Status: DC | PRN

## 2012-10-30 MED ORDER — METHOCARBAMOL 500 MG PO TABS: 500.0000 mg | ORAL_TABLET | Freq: Four times a day (QID) | ORAL | Status: DC | PRN

## 2012-10-30 MED ORDER — POLYETHYLENE GLYCOL 3350 17 G PO PACK: 17.0000 g | PACK | Freq: Two times a day (BID) | ORAL | Status: DC

## 2012-10-30 MED ORDER — ASPIRIN 325 MG PO TBEC: 325.0000 mg | DELAYED_RELEASE_TABLET | Freq: Two times a day (BID) | ORAL | Status: DC

## 2012-10-30 MED ORDER — CELECOXIB 200 MG PO CAPS: 200.0000 mg | ORAL_CAPSULE | Freq: Two times a day (BID) | ORAL | Status: DC

## 2012-10-30 NOTE — Progress Notes (Signed)
   Subjective: 1 Day Post-Op Procedure(s) (LRB): RIGHT TOTAL HIP ARTHROPLASTY ANTERIOR APPROACH (Right)   Patient reports pain as mild, pain well controlled. No events throughout the night. Doing really well with PT. Ready to be discharged home.  Objective:   VITALS:   Filed Vitals:   10/30/12 0511  BP: 97/66  Pulse: 67  Temp: 98.8 F (37.1 C)  Resp: 14    Neurovascular intact Dorsiflexion/Plantar flexion intact Incision: dressing C/D/I No cellulitis present Compartment soft  LABS  Recent Labs  10/30/12 0512  HGB 9.8*  HCT 30.9*  WBC 11.4*  PLT 183     Recent Labs  10/30/12 0512  NA 136  K 4.2  BUN 9  CREATININE 0.60  GLUCOSE 127*     Assessment/Plan: 1 Day Post-Op Procedure(s) (LRB): RIGHT TOTAL HIP ARTHROPLASTY ANTERIOR APPROACH (Right) HV drain d/c'ed Foley cath d/c'ed Advance diet Up with therapy D/C IV fluids Discharge home with home health Follow up in 2 weeks at [facility]. Follow up with OLIN,Paris Hohn D in 2 weeks.  Contact information:  [facility] [address] [place] [address] [phone]    Expected ABLA  Treated with iron and will observe        Anastasio Auerbach. Yordin Rhoda   PAC  10/30/2012, 9:52 AM

## 2012-10-30 NOTE — Progress Notes (Signed)
OT Cancellation Note  Patient Details Name: Carolyn Wood MRN: [id number] DOB: 1961-06-08   Cancelled Treatment:    Reason Eval/Treat Not Completed: Other (comment) (screen. Pt reports she has been up and doing ADL without difficulty. Observed pt up in room with friend briefly and seems to be doing well. She states she has a built in shower seat at home and handicapped toilet. No OT needs)  Lennox Laity [phone] 10/30/2012, 9:49 AM

## 2012-10-30 NOTE — Evaluation (Signed)
Physical Therapy Evaluation Patient Details Name: Carolyn Wood MRN: [id number] DOB: October 17, 1960 Today's Date: 10/30/2012 Time: 4403-4742 PT Time Calculation (min): 26 min  PT Assessment / Plan / Recommendation Clinical Impression  Pt s/p R THR presents with functional mobility limitations 2* decreased R LE strength/ROM and post op discomfort    PT Assessment  Patient needs continued PT services    Follow Up Recommendations  Home health PT    Does the patient have the potential to tolerate intense rehabilitation      Barriers to Discharge None      Equipment Recommendations  Rolling walker with 5" wheels    Recommendations for Other Services OT consult   Frequency 7X/week    Precautions / Restrictions Precautions Precautions: Fall Restrictions Weight Bearing Restrictions: No   Pertinent Vitals/Pain 2/10; premed      Mobility  Bed Mobility Bed Mobility: Supine to Sit Supine to Sit: 5: Supervision Transfers Transfers: Sit to Stand;Stand to Sit Sit to Stand: 5: Supervision Stand to Sit: 5: Supervision Details for Transfer Assistance: cues for use of UEs to self assist Ambulation/Gait Ambulation/Gait Assistance: 4: Min guard;5: Supervision Ambulation Distance (Feet): 300 Feet Assistive device: Rolling walker Ambulation/Gait Assistance Details: min cues for position from RW Gait Pattern: Step-through pattern;Antalgic Stairs: Yes Stairs Assistance: 5: Supervision Stairs Assistance Details (indicate cue type and reason): cued for sequence but performed recip gait Stair Management Technique: Two rails;Forwards Number of Stairs: 3    Exercises Total Joint Exercises Ankle Circles/Pumps: AROM;10 reps;Supine;Both Quad Sets: AROM;10 reps;Supine;Both Heel Slides: AAROM;20 reps;Supine;Right Hip ABduction/ADduction: AAROM;20 reps;Supine;Right   PT Diagnosis: Difficulty walking  PT Problem List: Decreased strength;Decreased range of motion;Decreased activity  tolerance;Decreased mobility;Decreased knowledge of use of DME;Pain PT Treatment Interventions: DME instruction;Gait training;Stair training;Functional mobility training;Therapeutic activities;Therapeutic exercise;Patient/family education   PT Goals Acute Rehab PT Goals PT Goal Formulation: With patient Time For Goal Achievement: 11/01/12 Potential to Achieve Goals: Good Pt will go Supine/Side to Sit: with modified independence PT Goal: Supine/Side to Sit - Progress: Goal set today Pt will go Sit to Supine/Side: with modified independence PT Goal: Sit to Supine/Side - Progress: Goal set today Pt will go Sit to Stand: with modified independence PT Goal: Sit to Stand - Progress: Goal set today Pt will go Stand to Sit: with modified independence PT Goal: Stand to Sit - Progress: Goal set today Pt will Ambulate: >150 feet;with modified independence;with rolling walker PT Goal: Ambulate - Progress: Goal set today Pt will Go Up / Down Stairs: 1-2 stairs;with modified independence;with rail(s) PT Goal: Up/Down Stairs - Progress: Goal set today  Visit Information  Last PT Received On: 10/30/12 Assistance Needed: +1    Subjective Data  Subjective: I've been moving around already and it really doesn't hurt much Patient Stated Goal: Resume previous lifestyle with decreased pain   Prior Functioning  Home Living Lives With: Spouse Available Help at Discharge: Family Type of Home: House Home Access: Stairs to enter Secretary/administrator of Steps: 3 Entrance Stairs-Rails: Right;Left;Can reach both Home Layout: One level Home Adaptive Equipment: None Prior Function Level of Independence: Independent Able to Take Stairs?: Yes Driving: Yes Vocation: Full time employment Communication Communication: No difficulties    Cognition  Cognition Arousal/Alertness: Awake/alert Behavior During Therapy: WFL for tasks assessed/performed Overall Cognitive Status: Within Functional Limits for  tasks assessed    Extremity/Trunk Assessment Right Upper Extremity Assessment RUE ROM/Strength/Tone: [facility] for tasks assessed Left Upper Extremity Assessment LUE ROM/Strength/Tone: [place] for tasks assessed Right Lower Extremity Assessment RLE  ROM/Strength/Tone: Deficits RLE ROM/Strength/Tone Deficits: Hip strength 2+/5 - AAROM at hip to 100 flex, 40 abd Left Lower Extremity Assessment LLE ROM/Strength/Tone: WFL for tasks assessed   Balance    End of Session PT - End of Session Activity Tolerance: Patient tolerated treatment well Patient left: in chair;with call bell/phone within reach;with family/visitor present Nurse Communication: Mobility status  GP     Levent Kornegay 10/30/2012, 9:00 AM

## 2012-10-31 NOTE — Discharge Summary (Signed)
Physician Discharge Summary  Patient ID: Carl Bleecker MRN: [id number] DOB/AGE: 1960/11/23 52 y.o.  Admit date: 10/29/2012 Discharge date: 10/30/2012   Procedures:  Procedure(s) (LRB): RIGHT TOTAL HIP ARTHROPLASTY ANTERIOR APPROACH (Right)  Attending Physician:  Dr. Durene Romans   Admission Diagnoses:   Right hip OA / pain  Discharge Diagnoses:  Principal Problem:   S/P right THA, AA Active Problems:   Expected blood loss anemia  Past Medical History  Diagnosis Date  . Hypertension   . Seasonal allergies   . Arthritis   . Complication of anesthesia     combative when waking up    HPI: Carolyn Wood, 52 y.o. female, has a history of pain and functional disability in the right hip(s) due to trauma and patient has failed non-surgical conservative treatments for greater than 12 weeks to include NSAID's and/or analgesics, corticosteriod injections and activity modification. Onset of symptoms was abrupt starting September 12, 2011 with a fall off some decking while at work with gradually worsening course since that time.The patient noted no past surgery on the right hip(s). Patient currently rates pain in the right hip at 7 out of 10 with activity. Patient has night pain, worsening of pain with activity and weight bearing, trendelenberg gait, pain that interfers with activities of daily living and pain with passive range of motion. Patient has evidence of periarticular osteophytes by imaging studies. This condition presents safety issues increasing the risk of falls. There is no current active infection. Risks, benefits and expectations were discussed with the patient. Patient understand the risks, benefits and expectations and wishes to proceed with surgery.   PCP: Default, Provider, MD   Discharged Condition: good  Hospital Course:  Patient underwent the above stated procedure on 10/29/2012. Patient tolerated the procedure well and brought to the recovery room in good  condition and subsequently to the floor.  POD #1 BP: 97/66 ; Pulse: 67 ; Temp: 98.8 F (37.1 C) ; Resp: 14 Pt's foley was removed, as well as the hemovac drain removed. IV was changed to a saline lock. Patient reports pain as mild, pain well controlled. No events throughout the night. Doing really well with PT. Ready to be discharged home. Neurovascular intact, dorsiflexion/plantar flexion intact, incision: dressing C/D/I, no cellulitis present and compartment soft.   LABS  Basename  10/30/12    0512  HGB  9.8  HCT  30.9    Discharge Exam: General appearance: alert, cooperative and no distress Extremities: Homans sign is negative, no sign of DVT, no edema, redness or tenderness in the calves or thighs and no ulcers, gangrene or trophic changes  Disposition:   Home or Self Care with follow up in 2 weeks   Follow-up Information   Follow up with Shelda Pal, MD. Schedule an appointment as soon as possible for a visit in 2 weeks.   Contact information:   [address] [place] [address] [place] [address] [phone]       Discharge Orders   Future Orders Complete By Expires     Call MD / Call 911  As directed     Comments:      If you experience chest pain or shortness of breath, CALL 911 and be transported to the hospital emergency room.  If you develope a fever above 101 F, pus (white drainage) or increased drainage or redness at the wound, or calf pain, call your surgeon's office.    Change dressing  As directed     Comments:  Maintain surgical dressing for 10-14 days, then replace with 4x4 guaze and tape. Keep the area dry and clean.    Constipation Prevention  As directed     Comments:      Drink plenty of fluids.  Prune juice may be helpful.  You may use a stool softener, such as Colace (over the counter) 100 mg twice a day.  Use MiraLax (over the counter) for constipation as needed.    Diet - low sodium heart healthy  As directed     Discharge instructions  As  directed     Comments:      Maintain surgical dressing for 10-14 days, then replace with gauze and tape. Keep the area dry and clean until follow up. Follow up in 2 weeks at [facility]. Call with any questions or concerns.    Increase activity slowly as tolerated  As directed     TED hose  As directed     Comments:      Use stockings (TED hose) for 2 weeks on both leg(s).  You may remove them at night for sleeping.    Weight bearing as tolerated  As directed          Medication List    STOP taking these medications       diclofenac 75 MG EC tablet  Commonly known as:  VOLTAREN     ibuprofen 600 MG tablet  Commonly known as:  ADVIL,MOTRIN      TAKE these medications       aspirin 325 MG EC tablet  Take 1 tablet (325 mg total) by mouth 2 (two) times daily.     benazepril 40 MG tablet  Commonly known as:  LOTENSIN  Take 40 mg by mouth every morning.     celecoxib 200 MG capsule  Commonly known as:  CELEBREX  Take 1 capsule (200 mg total) by mouth every 12 (twelve) hours.     cholecalciferol 1000 UNITS tablet  Commonly known as:  VITAMIN D  Take 1,000 Units by mouth daily.     clonazePAM 0.5 MG tablet  Commonly known as:  KLONOPIN  Take 0.25-0.5 mg by mouth at bedtime as needed for anxiety.     DSS 100 MG Caps  Take 100 mg by mouth 2 (two) times daily.     EQL NASAL FOUR 1 % nasal spray  Generic drug:  phenylephrine  Place 1 drop into the nose every 4 (four) hours as needed for congestion.     ferrous sulfate 325 (65 FE) MG tablet  Take 1 tablet (325 mg total) by mouth 3 (three) times daily after meals.     fish oil-omega-3 fatty acids 1000 MG capsule  Take 1 g by mouth daily.     HYDROcodone-acetaminophen 7.5-325 MG per tablet  Commonly known as:  NORCO  Take 1-2 tablets by mouth every 4 (four) hours as needed for pain.     methocarbamol 500 MG tablet  Commonly known as:  ROBAXIN  Take 1 tablet (500 mg total) by mouth every 6 (six) hours as  needed (muscle spasms).     OVER THE COUNTER MEDICATION  Take 1 capsule by mouth daily. Oxy elite pro     OVER THE COUNTER MEDICATION  Place 1 drop into both eyes daily as needed (for dry eyes). Hydro tears     polyethylene glycol packet  Commonly known as:  MIRALAX / GLYCOLAX  Take 17 g by mouth 2 (two) times daily.  psyllium 58.6 % powder  Commonly known as:  METAMUCIL  Take 2 packets by mouth daily.         Signed: Anastasio Auerbach. Norvell Ureste   PAC  10/31/2012, 10:44 AM

## 2012-10-31 NOTE — Progress Notes (Signed)
10/31/2012 Dory Peru RN CCM [phone] Received call from customer service at [facility] that they were in the process of setting up home health services and DMe for worker's comp patient. States they have already been in contact with patient and will update her regarding HH services and delivery of RW. Advised of importance of setting up services as soon as possible. vVoices understanding. Recieved call from Tamara-adjuster from [facility], states she has been in contact with patient and that [facility] would be setting up [facility] services and DME for patient.State patient will be notified of dme delivery and HHpt start date. Received call from telephonic case manager-nicole; states she has been assigned to case and has been in contact with patient. She states he will be in contact with pt regarding rehab services(HHpt or OUtpt services) [phone].

## 2013-06-26 HISTORY — PX: AUGMENTATION MAMMAPLASTY: SUR837

## 2013-06-26 HISTORY — PX: ABDOMINOPLASTY: SUR9

## 2013-10-28 ENCOUNTER — Ambulatory Visit: Payer: Self-pay | Admitting: Orthopedic Surgery

## 2013-11-05 DIAGNOSIS — Z9289 Personal history of other medical treatment: Secondary | ICD-10-CM

## 2013-11-05 HISTORY — DX: Personal history of other medical treatment: Z92.89

## 2014-07-02 ENCOUNTER — Ambulatory Visit: Payer: Self-pay | Admitting: Podiatry

## 2014-11-10 ENCOUNTER — Other Ambulatory Visit: Payer: Self-pay | Admitting: Obstetrics & Gynecology

## 2014-11-10 DIAGNOSIS — Z1231 Encounter for screening mammogram for malignant neoplasm of breast: Secondary | ICD-10-CM

## 2014-12-02 ENCOUNTER — Other Ambulatory Visit: Payer: Self-pay | Admitting: Obstetrics & Gynecology

## 2014-12-02 ENCOUNTER — Ambulatory Visit
Admission: RE | Admit: 2014-12-02 | Discharge: 2014-12-02 | Disposition: A | Discharge status: Home / Self Care | Payer: BC Managed Care – PPO | Source: Ambulatory Visit | Attending: Obstetrics & Gynecology | Admitting: Obstetrics & Gynecology

## 2014-12-02 DIAGNOSIS — Z1231 Encounter for screening mammogram for malignant neoplasm of breast: Secondary | ICD-10-CM | POA: Diagnosis not present

## 2015-01-12 ENCOUNTER — Other Ambulatory Visit: Payer: Self-pay | Admitting: Family Medicine

## 2015-03-22 ENCOUNTER — Encounter: Payer: Self-pay | Admitting: Family Medicine

## 2015-03-22 ENCOUNTER — Ambulatory Visit (INDEPENDENT_AMBULATORY_CARE_PROVIDER_SITE_OTHER): Payer: BC Managed Care – PPO | Admitting: Family Medicine

## 2015-03-22 VITALS — BP 121/78 | HR 86 | Temp 98.9°F | Ht 67.0 in | Wt 157.5 lb

## 2015-03-22 DIAGNOSIS — Z23 Encounter for immunization: Secondary | ICD-10-CM | POA: Diagnosis not present

## 2015-03-22 DIAGNOSIS — Z0000 Encounter for general adult medical examination without abnormal findings: Secondary | ICD-10-CM | POA: Diagnosis not present

## 2015-03-22 DIAGNOSIS — F419 Anxiety disorder, unspecified: Secondary | ICD-10-CM | POA: Diagnosis not present

## 2015-03-22 LAB — URINALYSIS, ROUTINE W REFLEX MICROSCOPIC
Bilirubin, UA: NEGATIVE
Glucose, UA: NEGATIVE
Leukocytes, UA: NEGATIVE
Nitrite, UA: NEGATIVE
Protein, UA: NEGATIVE
RBC, UA: NEGATIVE
Specific Gravity, UA: 1.015 (ref 1.005–1.030)
Urobilinogen, Ur: 1 mg/dL (ref 0.2–1.0)
pH, UA: 7.5 (ref 5.0–7.5)

## 2015-03-22 MED ORDER — CLONAZEPAM 1 MG PO TABS: 1.0000 mg | ORAL_TABLET | Freq: Every day | ORAL | Status: DC | PRN

## 2015-03-22 MED ORDER — BENAZEPRIL HCL 40 MG PO TABS: 40.0000 mg | ORAL_TABLET | Freq: Every day | ORAL | Status: DC

## 2015-03-22 NOTE — Progress Notes (Signed)
BP 121/78 mmHg  Pulse 86  Temp(Src) 98.9 F (37.2 C)  Ht  (1.702 m)  Wt 157 lb 8 oz (71.442 kg)  BMI 24.66 kg/m2  SpO2 96%   Subjective:    Patient ID: Carolyn Wood, female    DOB: [birth year], 54 y.o.   MRN: [id number]  HPI: Carolyn Wood is a 54 y.o. female  Chief Complaint  Patient presents with  . Annual Exam  patient having great deal of job stress anxiety job related.  Reluctant to consider further medications or evaluation.  Patient is been managing with medication and relaxation techniques  Takes Klonopin at night every night helps sometimes with sleep sometimes falls asleep in the recliner.  Blood pressure doing well on medications  Patient is lost a lot of weight primarily resulting from stress.   Relevant past medical, surgical, family and social history reviewed and updated as indicated. Interim medical history since our last visit reviewed. Allergies and medications reviewed and updated.  Review of Systems  Constitutional: Positive for fatigue.  HENT: Negative.   Eyes: Negative.   Respiratory: Negative.   Cardiovascular: Negative.   Gastrointestinal: Negative.   Endocrine: Negative.   Genitourinary: Negative.   Musculoskeletal: Negative.   Skin: Negative.   Allergic/Immunologic: Negative.   Neurological: Negative.   Hematological: Negative.   Psychiatric/Behavioral: Negative.     Per HPI unless specifically indicated above     Objective:    BP 121/78 mmHg  Pulse 86  Temp(Src) 98.9 F (37.2 C)  Ht  (1.702 m)  Wt 157 lb 8 oz (71.442 kg)  BMI 24.66 kg/m2  SpO2 96%  Wt Readings from Last 3 Encounters:  03/22/15 157 lb 8 oz (71.442 kg)  06/16/14 157 lb (71.215 kg)  10/29/12 160 lb (72.576 kg)    Physical Exam  Constitutional: She is oriented to person, place, and time. She appears well-developed and well-nourished. No distress.  HENT:  Head: Normocephalic and atraumatic.  Right Ear: Hearing and external ear  normal.  Left Ear: Hearing and external ear normal.  Nose: Nose normal.  Mouth/Throat: Oropharynx is clear and moist.  Eyes: Conjunctivae, EOM and lids are normal. Pupils are equal, round, and reactive to light. Right eye exhibits no discharge. Left eye exhibits no discharge. No scleral icterus.  Neck: Normal range of motion. Neck supple. Carotid bruit is not present.  Cardiovascular: Normal rate, regular rhythm and normal heart sounds.   No murmur heard. Pulmonary/Chest: Effort normal and breath sounds normal. No respiratory distress.  Abdominal: Soft. Bowel sounds are normal. There is no hepatosplenomegaly.  Musculoskeletal: Normal range of motion.  Neurological: She is alert and oriented to person, place, and time.  Skin: Skin is intact. No rash noted.  Psychiatric: She has a normal mood and affect. Her speech is normal and behavior is normal. Judgment and thought content normal. Cognition and memory are normal.        Assessment & Plan:   Problem List Items Addressed This Visit      Other   Anxiety    Other Visit Diagnoses    Immunization due    -  Primary    Relevant Orders    Flu Vaccine QUAD 36+ mos PF IM (Fluarix & Fluzone Quad PF) (Completed)    CBC with Differential/Platelet    Comprehensive metabolic panel    Lipid Panel w/o Chol/HDL Ratio    TSH    Urinalysis, Routine w reflex microscopic (not at [facility])  Routine general medical examination at a health care facility        Relevant Orders    CBC with Differential/Platelet    Comprehensive metabolic panel    Lipid Panel w/o Chol/HDL Ratio    TSH    Urinalysis, Routine w reflex microscopic (not at [facility])        Follow up plan: Return in about 3 months (around 06/21/2015), or if symptoms worsen or fail to improve, for Anxiety stress.

## 2015-03-22 NOTE — Progress Notes (Signed)
   BP 121/78 mmHg  Pulse 86  Temp(Src) 98.9 F (37.2 C)  Ht  (1.702 m)  Wt 157 lb 8 oz (71.442 kg)  BMI 24.66 kg/m2  SpO2 96%   Subjective:    Patient ID: Carolyn Wood, female    DOB: 02/13/1961, 54 y.o.   MRN: [id number]  HPI: Carolyn Wood is a 54 y.o. female  Chief Complaint  Patient presents with  . Annual Exam    Relevant past medical, surgical, family and social history reviewed and updated as indicated. Interim medical history since our last visit reviewed. Allergies and medications reviewed and updated.  Review of Systems  Per HPI unless specifically indicated above     Objective:    BP 121/78 mmHg  Pulse 86  Temp(Src) 98.9 F (37.2 C)  Ht  (1.702 m)  Wt 157 lb 8 oz (71.442 kg)  BMI 24.66 kg/m2  SpO2 96%  Wt Readings from Last 3 Encounters:  03/22/15 157 lb 8 oz (71.442 kg)  06/16/14 157 lb (71.215 kg)  10/29/12 160 lb (72.576 kg)    Physical Exam  Results for orders placed or performed during the hospital encounter of 10/29/12  CBC  Result Value Ref Range   WBC 11.4 (H) 4.0 - 10.5 K/uL   RBC 3.27 (L) 3.87 - 5.11 MIL/uL   Hemoglobin 9.8 (L) 12.0 - 15.0 g/dL   HCT 40.9 (L) 81.1 - 91.4 %   MCV 94.5 78.0 - 100.0 fL   MCH 30.0 26.0 - 34.0 pg   MCHC 31.7 30.0 - 36.0 g/dL   RDW 78.2 95.6 - 21.3 %   Platelets 183 150 - 400 K/uL  Basic metabolic panel  Result Value Ref Range   Sodium 136 135 - 145 mEq/L   Potassium 4.2 3.5 - 5.1 mEq/L   Chloride 105 96 - 112 mEq/L   CO2 27 19 - 32 mEq/L   Glucose, Bld 127 (H) 70 - 99 mg/dL   BUN 9 6 - 23 mg/dL   Creatinine, Ser 0.86 0.50 - 1.10 mg/dL   Calcium 8.2 (L) 8.4 - 10.5 mg/dL   GFR calc non Af Amer >90 >90 mL/min   GFR calc Af Amer >90 >90 mL/min      Assessment & Plan:   Problem List Items Addressed This Visit    None    Visit Diagnoses    Immunization due    -  Primary    Relevant Orders    Flu Vaccine QUAD 36+ mos PF IM (Fluarix & Fluzone Quad PF) (Completed)    CBC with Differential/Platelet    Comprehensive metabolic panel    Lipid Panel w/o Chol/HDL Ratio    TSH    Urinalysis, Routine w reflex microscopic (not at [facility])    Routine general medical examination at a health care facility        Relevant Orders    CBC with Differential/Platelet    Comprehensive metabolic panel    Lipid Panel w/o Chol/HDL Ratio    TSH    Urinalysis, Routine w reflex microscopic (not at [facility])        Follow up plan: No Follow-up on file.

## 2015-03-23 ENCOUNTER — Encounter: Payer: Self-pay | Admitting: Family Medicine

## 2015-03-23 LAB — COMPREHENSIVE METABOLIC PANEL
ALT: 16 IU/L (ref 0–32)
AST: 28 IU/L (ref 0–40)
Albumin/Globulin Ratio: 1.7 (ref 1.1–2.5)
Albumin: 4.3 g/dL (ref 3.5–5.5)
Alkaline Phosphatase: 43 IU/L (ref 39–117)
BUN/Creatinine Ratio: 11 (ref 9–23)
BUN: 8 mg/dL (ref 6–24)
Bilirubin Total: 0.2 mg/dL (ref 0.0–1.2)
CO2: 26 mmol/L (ref 18–29)
Calcium: 9.1 mg/dL (ref 8.7–10.2)
Chloride: 98 mmol/L (ref 97–108)
Creatinine, Ser: 0.72 mg/dL (ref 0.57–1.00)
GFR calc Af Amer: 112 mL/min/{1.73_m2} (ref >59)
GFR calc non Af Amer: 97 mL/min/{1.73_m2} (ref >59)
Globulin, Total: 2.5 g/dL (ref 1.5–4.5)
Glucose: 94 mg/dL (ref 65–99)
Potassium: 4.1 mmol/L (ref 3.5–5.2)
Sodium: 139 mmol/L (ref 134–144)
Total Protein: 6.8 g/dL (ref 6.0–8.5)

## 2015-03-23 LAB — CBC WITH DIFFERENTIAL/PLATELET
Basophils Absolute: 0 10*3/uL (ref 0.0–0.2)
Basos: 0 %
EOS (ABSOLUTE): 0 10*3/uL (ref 0.0–0.4)
Eos: 0 %
Hematocrit: 41.4 % (ref 34.0–46.6)
Hemoglobin: 13.4 g/dL (ref 11.1–15.9)
Immature Grans (Abs): 0 10*3/uL (ref 0.0–0.1)
Immature Granulocytes: 0 %
Lymphocytes Absolute: 3.1 10*3/uL (ref 0.7–3.1)
Lymphs: 34 %
MCH: 30.3 pg (ref 26.6–33.0)
MCHC: 32.4 g/dL (ref 31.5–35.7)
MCV: 94 fL (ref 79–97)
Monocytes Absolute: 0.7 10*3/uL (ref 0.1–0.9)
Monocytes: 8 %
Neutrophils Absolute: 5.2 10*3/uL (ref 1.4–7.0)
Neutrophils: 58 %
Platelets: 289 10*3/uL (ref 150–379)
RBC: 4.42 x10E6/uL (ref 3.77–5.28)
RDW: 14 % (ref 12.3–15.4)
WBC: 9.1 10*3/uL (ref 3.4–10.8)

## 2015-03-23 LAB — LIPID PANEL W/O CHOL/HDL RATIO
Cholesterol, Total: 205 mg/dL — ABNORMAL HIGH (ref 100–199)
HDL: 63 mg/dL (ref >39)
LDL Calculated: 112 mg/dL — ABNORMAL HIGH (ref 0–99)
Triglycerides: 148 mg/dL (ref 0–149)
VLDL Cholesterol Cal: 30 mg/dL (ref 5–40)

## 2015-03-23 LAB — TSH: TSH: 0.898 u[IU]/mL (ref 0.450–4.500)

## 2015-05-09 ENCOUNTER — Other Ambulatory Visit: Payer: Self-pay | Admitting: Family Medicine

## 2015-05-10 NOTE — Telephone Encounter (Signed)
Apt?

## 2015-05-24 NOTE — Telephone Encounter (Signed)
LM for pt to call and schedule appt. °

## 2015-05-24 NOTE — Telephone Encounter (Signed)
Appt 05/31/15

## 2015-05-31 ENCOUNTER — Encounter: Payer: Self-pay | Admitting: Family Medicine

## 2015-05-31 ENCOUNTER — Ambulatory Visit (INDEPENDENT_AMBULATORY_CARE_PROVIDER_SITE_OTHER): Payer: BC Managed Care – PPO | Admitting: Family Medicine

## 2015-05-31 VITALS — BP 133/86 | HR 69 | Temp 98.0°F | Ht 67.0 in | Wt 160.0 lb

## 2015-05-31 DIAGNOSIS — F419 Anxiety disorder, unspecified: Secondary | ICD-10-CM

## 2015-05-31 MED ORDER — CLONAZEPAM 1 MG PO TABS: 1.0000 mg | ORAL_TABLET | Freq: Every day | ORAL | Status: DC

## 2015-05-31 NOTE — Assessment & Plan Note (Signed)
Discussed chronic anxiety and use of controlled substances We'll continue Klonopin 1 mg with next her half as needed when necessary which we have done long-term Discussed needing to see psychiatry to get further evaluation of anxiety and assess for proper treatment Goal psychiatry is to have endorsement for proper management of anxiety and use of controled drugs.

## 2015-05-31 NOTE — Progress Notes (Signed)
BP 133/86 mmHg  Pulse 69  Temp(Src) 98 F (36.7 C)  Ht 5\' 7"  (1.702 m)  Wt 160 lb (72.576 kg)  BMI 25.05 kg/m2  SpO2 98%   Subjective:    Patient ID: Carolyn Wood, female    DOB: 11/05/1960, 54 y.o.   MRN: [id number]  HPI: Carolyn Wood is a 54 y.o. female  Chief Complaint  Patient presents with  . Anxiety  . Sore Throat   patient with chronic anxiety treated with clonazepam half to 1 mg taken most days sometimes in the evenings after work and at bedtime to help with sleep. Patient's done this long-term. As tried latuda and Abilify in the past and had side effects and was unable to take. Has very stressful job gets wound up at work. Also patient has continued to lose weight in spite of scales today is patient's wearing her duty belt and shoes. Relevant past medical, surgical, family and social history reviewed and updated as indicated. Interim medical history since our last visit reviewed. Allergies and medications reviewed and updated.  Review of Systems  Constitutional: Negative.   Respiratory: Negative.   Cardiovascular: Negative.     Per HPI unless specifically indicated above     Objective:    BP 133/86 mmHg  Pulse 69  Temp(Src) 98 F (36.7 C)  Ht 5\' 7"  (1.702 m)  Wt 160 lb (72.576 kg)  BMI 25.05 kg/m2  SpO2 98%  Wt Readings from Last 3 Encounters:  05/31/15 160 lb (72.576 kg)  03/22/15 157 lb 8 oz (71.442 kg)  06/16/14 157 lb (71.215 kg)    Physical Exam  Constitutional: She is oriented to person, place, and time. She appears well-developed and well-nourished. No distress.  HENT:  Head: Normocephalic and atraumatic.  Right Ear: Hearing normal.  Left Ear: Hearing normal.  Nose: Nose normal.  Eyes: Conjunctivae and lids are normal. Right eye exhibits no discharge. Left eye exhibits no discharge. No scleral icterus.  Cardiovascular: Normal rate, regular rhythm and normal heart sounds.   Pulmonary/Chest: Effort normal and breath  sounds normal. No respiratory distress.  Musculoskeletal: Normal range of motion.  Neurological: She is alert and oriented to person, place, and time.  Skin: Skin is intact. No rash noted.  Psychiatric: She has a normal mood and affect. Her speech is normal and behavior is normal. Judgment and thought content normal. Cognition and memory are normal.    Results for orders placed or performed in visit on 03/22/15  CBC with Differential/Platelet  Result Value Ref Range   WBC 9.1 3.4 - 10.8 x10E3/uL   RBC 4.42 3.77 - 5.28 x10E6/uL   Hemoglobin 13.4 11.1 - 15.9 g/dL   Hematocrit 40.941.4 81.134.0 - 46.6 %   MCV 94 79 - 97 fL   MCH 30.3 26.6 - 33.0 pg   MCHC 32.4 31.5 - 35.7 g/dL   RDW 91.414.0 78.212.3 - 95.615.4 %   Platelets 289 150 - 379 x10E3/uL   Neutrophils 58 %   Lymphs 34 %   Monocytes 8 %   Eos 0 %   Basos 0 %   Neutrophils Absolute 5.2 1.4 - 7.0 x10E3/uL   Lymphocytes Absolute 3.1 0.7 - 3.1 x10E3/uL   Monocytes Absolute 0.7 0.1 - 0.9 x10E3/uL   EOS (ABSOLUTE) 0.0 0.0 - 0.4 x10E3/uL   Basophils Absolute 0.0 0.0 - 0.2 x10E3/uL   Immature Granulocytes 0 %   Immature Grans (Abs) 0.0 0.0 - 0.1 x10E3/uL  Comprehensive metabolic panel  Result Value Ref Range   Glucose 94 65 - 99 mg/dL   BUN 8 6 - 24 mg/dL   Creatinine, Ser 1.61 0.57 - 1.00 mg/dL   GFR calc non Af Amer 97 >59 mL/min/1.73   GFR calc Af Amer 112 >59 mL/min/1.73   BUN/Creatinine Ratio 11 9 - 23   Sodium 139 134 - 144 mmol/L   Potassium 4.1 3.5 - 5.2 mmol/L   Chloride 98 97 - 108 mmol/L   CO2 26 18 - 29 mmol/L   Calcium 9.1 8.7 - 10.2 mg/dL   Total Protein 6.8 6.0 - 8.5 g/dL   Albumin 4.3 3.5 - 5.5 g/dL   Globulin, Total 2.5 1.5 - 4.5 g/dL   Albumin/Globulin Ratio 1.7 1.1 - 2.5   Bilirubin Total 0.2 0.0 - 1.2 mg/dL   Alkaline Phosphatase 43 39 - 117 IU/L   AST 28 0 - 40 IU/L   ALT 16 0 - 32 IU/L  Lipid Panel w/o Chol/HDL Ratio  Result Value Ref Range   Cholesterol, Total 205 (H) 100 - 199 mg/dL   Triglycerides 096 0 - 149  mg/dL   HDL 63 >04 mg/dL   VLDL Cholesterol Cal 30 5 - 40 mg/dL   LDL Calculated 540 (H) 0 - 99 mg/dL  TSH  Result Value Ref Range   TSH 0.898 0.450 - 4.500 uIU/mL  Urinalysis, Routine w reflex microscopic (not at [facility])  Result Value Ref Range   Specific Gravity, UA 1.015 1.005 - 1.030   pH, UA 7.5 5.0 - 7.5   Color, UA Yellow Yellow   Appearance Ur Cloudy (A) Clear   Leukocytes, UA Negative Negative   Protein, UA Negative Negative/Trace   Glucose, UA Negative Negative   Ketones, UA Trace (A) Negative   RBC, UA Negative Negative   Bilirubin, UA Negative Negative   Urobilinogen, Ur 1.0 0.2 - 1.0 mg/dL   Nitrite, UA Negative Negative      Assessment & Plan:   Problem List Items Addressed This Visit      Other   Anxiety - Primary    Discussed chronic anxiety and use of controlled substances We'll continue Klonopin 1 mg with next her half as needed when necessary which we have done long-term Discussed needing to see psychiatry to get further evaluation of anxiety and assess for proper treatment Goal psychiatry is to have endorsement for proper management of anxiety and use of controled drugs.          Follow up plan: Return in about 6 months (around 11/29/2015).

## 2015-10-05 ENCOUNTER — Ambulatory Visit (INDEPENDENT_AMBULATORY_CARE_PROVIDER_SITE_OTHER): Payer: BC Managed Care – PPO | Admitting: Family Medicine

## 2015-10-05 ENCOUNTER — Encounter: Payer: Self-pay | Admitting: Family Medicine

## 2015-10-05 VITALS — BP 134/84 | HR 85 | Temp 98.0°F | Ht 67.0 in | Wt 163.0 lb

## 2015-10-05 DIAGNOSIS — G4700 Insomnia, unspecified: Secondary | ICD-10-CM | POA: Insufficient documentation

## 2015-10-05 DIAGNOSIS — H9313 Tinnitus, bilateral: Secondary | ICD-10-CM | POA: Diagnosis not present

## 2015-10-05 MED ORDER — HYDROXYZINE HCL 25 MG PO TABS: 25.0000 mg | ORAL_TABLET | Freq: Every evening | ORAL | Status: DC | PRN

## 2015-10-05 NOTE — Progress Notes (Signed)
BP 134/84 mmHg  Pulse 85  Temp(Src) 98 F (36.7 C)  Ht  (1.702 m)  Wt 163 lb (73.936 kg)  BMI 25.52 kg/m2  SpO2 96%   Subjective:    Patient ID: Carolyn Wood, female    DOB: 03/23/1961, 55 y.o.   MRN: [id number]  HPI: Carolyn Wood is a 55 y.o. female  Chief Complaint  Patient presents with  . Tinnitus    x 6 months  Patient with ringing in both ears right little worse than left has some  speech discrimination and noisy environment issues. No dizziness Patient with continued insomnia and wants something stronger than Restoril does not want to go back to psychiatry discussed Restoril did help some but was would not put her to sleep and it sounds like she wants a knock out pill. I discussed emphatically that no knock out pills would be used and that controlled drugs would be avoided to use stronger controlled drugs psychiatry would need to be involved. Patient does drink a lot of coffee all day long discuss negative impact for both tinnitus and sleep  Relevant past medical, surgical, family and social history reviewed and updated as indicated. Interim medical history since our last visit reviewed. Allergies and medications reviewed and updated.  Review of Systems  Constitutional: Negative.   Respiratory: Negative.   Cardiovascular: Negative.     Per HPI unless specifically indicated above     Objective:    BP 134/84 mmHg  Pulse 85  Temp(Src) 98 F (36.7 C)  Ht  (1.702 m)  Wt 163 lb (73.936 kg)  BMI 25.52 kg/m2  SpO2 96%  Wt Readings from Last 3 Encounters:  10/05/15 163 lb (73.936 kg)  05/31/15 160 lb (72.576 kg)  03/22/15 157 lb 8 oz (71.442 kg)    Physical Exam  Constitutional: She is oriented to person, place, and time. She appears well-developed and well-nourished. No distress.  HENT:  Head: Normocephalic and atraumatic.  Right Ear: Hearing and external ear normal.  Left Ear: Hearing and external ear normal.  Nose: Nose  normal.  Mouth/Throat: Oropharynx is clear and moist.  Eyes: Conjunctivae and lids are normal. Right eye exhibits no discharge. Left eye exhibits no discharge. No scleral icterus.  Cardiovascular: Normal rate, regular rhythm and normal heart sounds.   Pulmonary/Chest: Effort normal and breath sounds normal. No respiratory distress.  Musculoskeletal: Normal range of motion.  Neurological: She is alert and oriented to person, place, and time.  Skin: Skin is intact. No rash noted.  Psychiatric: She has a normal mood and affect. Her speech is normal and behavior is normal. Judgment and thought content normal. Cognition and memory are normal.    Results for orders placed or performed in visit on 03/22/15  CBC with Differential/Platelet  Result Value Ref Range   WBC 9.1 3.4 - 10.8 x10E3/uL   RBC 4.42 3.77 - 5.28 x10E6/uL   Hemoglobin 13.4 11.1 - 15.9 g/dL   Hematocrit 78.2 95.6 - 46.6 %   MCV 94 79 - 97 fL   MCH 30.3 26.6 - 33.0 pg   MCHC 32.4 31.5 - 35.7 g/dL   RDW 21.3 08.6 - 57.8 %   Platelets 289 150 - 379 x10E3/uL   Neutrophils 58 %   Lymphs 34 %   Monocytes 8 %   Eos 0 %   Basos 0 %   Neutrophils Absolute 5.2 1.4 - 7.0 x10E3/uL   Lymphocytes Absolute 3.1 0.7 - 3.1 x10E3/uL  Monocytes Absolute 0.7 0.1 - 0.9 x10E3/uL   EOS (ABSOLUTE) 0.0 0.0 - 0.4 x10E3/uL   Basophils Absolute 0.0 0.0 - 0.2 x10E3/uL   Immature Granulocytes 0 %   Immature Grans (Abs) 0.0 0.0 - 0.1 x10E3/uL  Comprehensive metabolic panel  Result Value Ref Range   Glucose 94 65 - 99 mg/dL   BUN 8 6 - 24 mg/dL   Creatinine, Ser 1.610.72 0.57 - 1.00 mg/dL   GFR calc non Af Amer 97 >59 mL/min/1.73   GFR calc Af Amer 112 >59 mL/min/1.73   BUN/Creatinine Ratio 11 9 - 23   Sodium 139 134 - 144 mmol/L   Potassium 4.1 3.5 - 5.2 mmol/L   Chloride 98 97 - 108 mmol/L   CO2 26 18 - 29 mmol/L   Calcium 9.1 8.7 - 10.2 mg/dL   Total Protein 6.8 6.0 - 8.5 g/dL   Albumin 4.3 3.5 - 5.5 g/dL   Globulin, Total 2.5 1.5 - 4.5  g/dL   Albumin/Globulin Ratio 1.7 1.1 - 2.5   Bilirubin Total 0.2 0.0 - 1.2 mg/dL   Alkaline Phosphatase 43 39 - 117 IU/L   AST 28 0 - 40 IU/L   ALT 16 0 - 32 IU/L  Lipid Panel w/o Chol/HDL Ratio  Result Value Ref Range   Cholesterol, Total 205 (H) 100 - 199 mg/dL   Triglycerides 096148 0 - 149 mg/dL   HDL 63 >04>39 mg/dL   VLDL Cholesterol Cal 30 5 - 40 mg/dL   LDL Calculated 540112 (H) 0 - 99 mg/dL  TSH  Result Value Ref Range   TSH 0.898 0.450 - 4.500 uIU/mL  Urinalysis, Routine w reflex microscopic ([facility])  Result Value Ref Range   Specific Gravity, UA 1.015 1.005 - 1.030   pH, UA 7.5 5.0 - 7.5   Color, UA Yellow Yellow   Appearance Ur Cloudy (A) Clear   Leukocytes, UA Negative Negative   Protein, UA Negative Negative/Trace   Glucose, UA Negative Negative   Ketones, UA Trace (A) Negative   RBC, UA Negative Negative   Bilirubin, UA Negative Negative   Urobilinogen, Ur 1.0 0.2 - 1.0 mg/dL   Nitrite, UA Negative Negative      Assessment & Plan:   Problem List Items Addressed This Visit      Other   Tinnitus of both ears - Primary    Discuss aspirin and caffeine avoidance discuss noise avoidance ENT referral if not getting better      Insomnia    Discuss caffeine avoidance and rules of sleep hygiene Discuss avoiding controlled drugs and knock out sleeping pills We'll use hydroxyzine          Follow up plan: Return in about 6 months (around 04/05/2016) for Physical Exam in September.

## 2015-10-05 NOTE — Assessment & Plan Note (Signed)
Discuss caffeine avoidance and rules of sleep hygiene Discuss avoiding controlled drugs and knock out sleeping pills We'll use hydroxyzine

## 2015-10-05 NOTE — Assessment & Plan Note (Signed)
Discuss aspirin and caffeine avoidance discuss noise avoidance ENT referral if not getting better

## 2015-11-17 ENCOUNTER — Encounter: Payer: Self-pay | Admitting: Family Medicine

## 2015-11-17 ENCOUNTER — Ambulatory Visit (INDEPENDENT_AMBULATORY_CARE_PROVIDER_SITE_OTHER): Payer: BC Managed Care – PPO | Admitting: Family Medicine

## 2015-11-17 VITALS — BP 122/84 | HR 78 | Temp 98.2°F | Ht 67.3 in | Wt 153.0 lb

## 2015-11-17 DIAGNOSIS — F329 Major depressive disorder, single episode, unspecified: Secondary | ICD-10-CM | POA: Diagnosis not present

## 2015-11-17 DIAGNOSIS — I10 Essential (primary) hypertension: Secondary | ICD-10-CM | POA: Diagnosis not present

## 2015-11-17 DIAGNOSIS — F32A Depression, unspecified: Secondary | ICD-10-CM

## 2015-11-17 MED ORDER — POLYMYXIN B-TRIMETHOPRIM 10000-0.1 UNIT/ML-% OP SOLN: 1.0000 [drp] | OPHTHALMIC | Status: DC

## 2015-11-17 MED ORDER — FLUOXETINE HCL 20 MG PO TABS: 20.0000 mg | ORAL_TABLET | Freq: Every day | ORAL | Status: DC

## 2015-11-17 NOTE — Assessment & Plan Note (Signed)
The current medical regimen is effective;  continue present plan and medications.  

## 2015-11-17 NOTE — Progress Notes (Signed)
BP 122/84 mmHg  Pulse 78  Temp(Src) 98.2 F (36.8 C)  Ht 5' 7.3" (1.709 m)  Wt 153 lb (69.4 kg)  BMI 23.76 kg/m2  SpO2 99%   Subjective:    Patient ID: Carolyn Wood, female    DOB: 12/07/1960, 55 y.o.   MRN: [id number]  HPI: Carolyn Wood is a 55 y.o. female  Chief Complaint  Patient presents with  . Hypertension  Patient's blood pressure all in all doing okay but having a great deal of job stress which continues with loss of energy increased irritability loss of interest in usual things sad and blue tearful and depressed. 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  Relevant past medical, surgical, family and social history reviewed and updated as indicated. Interim medical history since our last visit reviewed. Allergies and medications reviewed and updated.  Review of Systems  Constitutional: Negative.   Respiratory: Negative.   Cardiovascular: Negative.     Per HPI unless specifically indicated above     Objective:    BP 122/84 mmHg  Pulse 78  Temp(Src) 98.2 F (36.8 C)  Ht 5' 7.3" (1.709 m)  Wt 153 lb (69.4 kg)  BMI 23.76 kg/m2  SpO2 99%  Wt Readings from Last 3 Encounters:  11/17/15 153 lb (69.4 kg)  10/05/15 163 lb (73.936 kg)  05/31/15 160 lb (72.576 kg)    Physical Exam  Constitutional: She is oriented to person, place, and time. She appears well-developed and well-nourished. No distress.  HENT:  Head: Normocephalic and atraumatic.  Right Ear: Hearing normal.  Left Ear: Hearing normal.  Nose: Nose normal.  Eyes: Conjunctivae and lids are normal. Right eye exhibits no discharge. Left eye exhibits no discharge. No scleral icterus.  Cardiovascular: Normal rate, regular rhythm and normal heart sounds.   Pulmonary/Chest: Effort normal and breath sounds normal. No respiratory distress.  Musculoskeletal: Normal range of motion.  Neurological:  She is alert and oriented to person, place, and time.  Skin: Skin is intact. No rash noted.  Psychiatric: She has a normal mood and affect. Her speech is normal and behavior is normal. Judgment and thought content normal. Cognition and memory are normal.    Results for orders placed or performed in visit on 03/22/15  CBC with Differential/Platelet  Result Value Ref Range   WBC 9.1 3.4 - 10.8 x10E3/uL   RBC 4.42 3.77 - 5.28 x10E6/uL   Hemoglobin 13.4 11.1 - 15.9 g/dL   Hematocrit 40.941.4 81.134.0 - 46.6 %   MCV 94 79 - 97 fL   MCH 30.3 26.6 - 33.0 pg   MCHC 32.4 31.5 - 35.7 g/dL   RDW 91.414.0 78.212.3 - 95.615.4 %   Platelets 289 150 - 379 x10E3/uL   Neutrophils 58 %   Lymphs 34 %   Monocytes 8 %   Eos 0 %   Basos 0 %   Neutrophils Absolute 5.2 1.4 - 7.0 x10E3/uL   Lymphocytes Absolute 3.1 0.7 - 3.1 x10E3/uL   Monocytes Absolute 0.7 0.1 - 0.9 x10E3/uL   EOS (ABSOLUTE) 0.0 0.0 - 0.4 x10E3/uL   Basophils Absolute 0.0 0.0 - 0.2 x10E3/uL   Immature Granulocytes 0 %   Immature Grans (Abs) 0.0 0.0 - 0.1 x10E3/uL  Comprehensive metabolic panel  Result Value Ref Range   Glucose 94 65 - 99 mg/dL   BUN 8 6 - 24 mg/dL  Creatinine, Ser 0.72 0.57 - 1.00 mg/dL   GFR calc non Af Amer 97 >59 mL/min/1.73   GFR calc Af Amer 112 >59 mL/min/1.73   BUN/Creatinine Ratio 11 9 - 23   Sodium 139 134 - 144 mmol/L   Potassium 4.1 3.5 - 5.2 mmol/L   Chloride 98 97 - 108 mmol/L   CO2 26 18 - 29 mmol/L   Calcium 9.1 8.7 - 10.2 mg/dL   Total Protein 6.8 6.0 - 8.5 g/dL   Albumin 4.3 3.5 - 5.5 g/dL   Globulin, Total 2.5 1.5 - 4.5 g/dL   Albumin/Globulin Ratio 1.7 1.1 - 2.5   Bilirubin Total 0.2 0.0 - 1.2 mg/dL   Alkaline Phosphatase 43 39 - 117 IU/L   AST 28 0 - 40 IU/L   ALT 16 0 - 32 IU/L  Lipid Panel w/o Chol/HDL Ratio  Result Value Ref Range   Cholesterol, Total 205 (H) 100 - 199 mg/dL   Triglycerides 130 0 - 149 mg/dL   HDL 63 >86 mg/dL   VLDL Cholesterol Cal 30 5 - 40 mg/dL   LDL Calculated 578 (H) 0 - 99  mg/dL  TSH  Result Value Ref Range   TSH 0.898 0.450 - 4.500 uIU/mL  Urinalysis, Routine w reflex microscopic (not at [facility])  Result Value Ref Range   Specific Gravity, UA 1.015 1.005 - 1.030   pH, UA 7.5 5.0 - 7.5   Color, UA Yellow Yellow   Appearance Ur Cloudy (A) Clear   Leukocytes, UA Negative Negative   Protein, UA Negative Negative/Trace   Glucose, UA Negative Negative   Ketones, UA Trace (A) Negative   RBC, UA Negative Negative   Bilirubin, UA Negative Negative   Urobilinogen, Ur 1.0 0.2 - 1.0 mg/dL   Nitrite, UA Negative Negative      Assessment & Plan:   Problem List Items Addressed This Visit      Cardiovascular and Mediastinum   Essential hypertension    The current medical regimen is effective;  continue present plan and medications.         Other   Depression - Primary    Discussed depression care and treatment discussed bipolar concerns and issues Will start Prozac with warnings on activation and too good to be true response will discontinue medications and reevaluate otherwise will recheck patient in 2-3 weeks      Relevant Medications   FLUoxetine (PROZAC) 20 MG tablet       Follow up plan: Return in about 2 weeks (around 12/01/2015), or if symptoms worsen or fail to improve, for Med check.

## 2015-11-17 NOTE — Assessment & Plan Note (Signed)
Discussed depression care and treatment discussed bipolar concerns and issues Will start Prozac with warnings on activation and too good to be true response will discontinue medications and reevaluate otherwise will recheck patient in 2-3 weeks

## 2015-11-23 ENCOUNTER — Telehealth: Payer: Self-pay | Admitting: Family Medicine

## 2015-11-23 NOTE — Telephone Encounter (Signed)
Pt came in and stated that her eyedrops weren't working and she would like to know if there was something else she can be given.

## 2015-11-23 NOTE — Telephone Encounter (Signed)
Phone call Discussed with patient with eyedrops not working needs to see eye doctor patient will make an appointment today.

## 2015-11-23 NOTE — Telephone Encounter (Signed)
Call pt 

## 2015-12-13 ENCOUNTER — Other Ambulatory Visit: Payer: Self-pay | Admitting: Family Medicine

## 2015-12-14 NOTE — Telephone Encounter (Signed)
Apt with rachel

## 2015-12-21 ENCOUNTER — Encounter: Payer: Self-pay | Admitting: Family Medicine

## 2015-12-21 NOTE — Telephone Encounter (Signed)
Letter sent.

## 2015-12-24 ENCOUNTER — Other Ambulatory Visit: Payer: Self-pay | Admitting: Obstetrics & Gynecology

## 2015-12-24 DIAGNOSIS — Z1231 Encounter for screening mammogram for malignant neoplasm of breast: Secondary | ICD-10-CM

## 2016-01-12 ENCOUNTER — Ambulatory Visit
Admission: RE | Admit: 2016-01-12 | Discharge: 2016-01-12 | Disposition: A | Discharge status: Home / Self Care | Payer: BC Managed Care – PPO | Source: Ambulatory Visit | Attending: Obstetrics & Gynecology | Admitting: Obstetrics & Gynecology

## 2016-01-12 ENCOUNTER — Other Ambulatory Visit: Payer: Self-pay | Admitting: Obstetrics & Gynecology

## 2016-01-12 DIAGNOSIS — Z1231 Encounter for screening mammogram for malignant neoplasm of breast: Secondary | ICD-10-CM

## 2016-03-20 DIAGNOSIS — J342 Deviated nasal septum: Secondary | ICD-10-CM | POA: Insufficient documentation

## 2016-03-20 DIAGNOSIS — J3489 Other specified disorders of nose and nasal sinuses: Secondary | ICD-10-CM | POA: Insufficient documentation

## 2016-03-26 ENCOUNTER — Other Ambulatory Visit: Payer: Self-pay | Admitting: Family Medicine

## 2016-04-20 ENCOUNTER — Other Ambulatory Visit: Payer: Self-pay | Admitting: Family Medicine

## 2016-04-20 NOTE — Telephone Encounter (Signed)
Routing to provider. No follow up scheduled. 

## 2016-07-27 HISTORY — PX: NASAL SEPTUM SURGERY: SHX37

## 2016-09-30 IMAGING — MG MM  DIGITAL SCREENING BREAST BILAT IMPLANT W/ TOMO W/ CAD
9 of 16 series · 9 of 32 positions shown · non-contrast
Comparison: Previous exam(s).

CLINICAL DATA: Screening.

EXAM:
2D DIGITAL SCREENING BILATERAL MAMMOGRAM WITH IMPLANTS, CAD AND
ADJUNCT TOMO
The patient has retropectoral implants. Standard and implant
displaced views were performed.

[R MLO (1 of 2)]
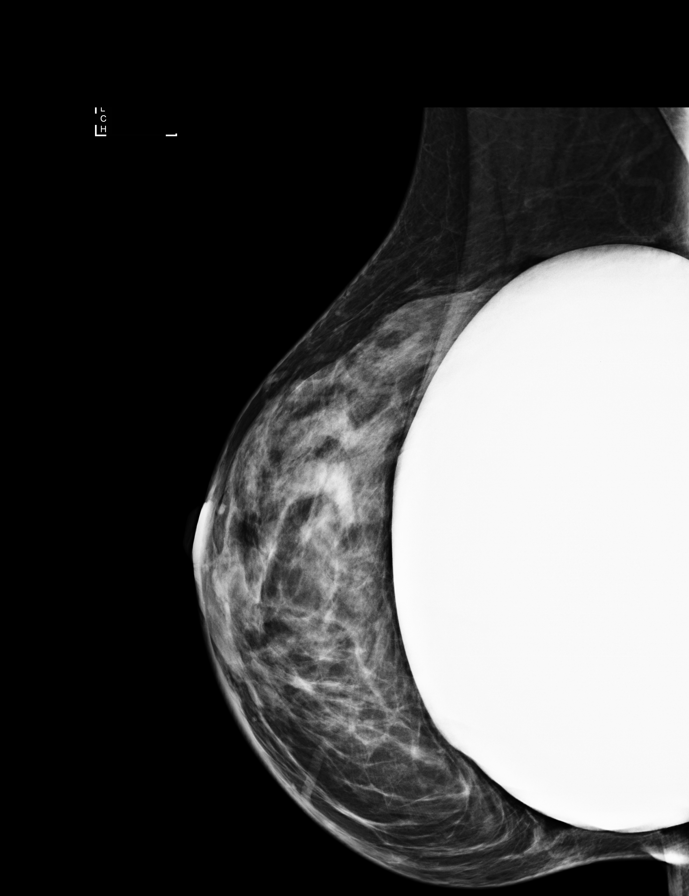

[L CC]
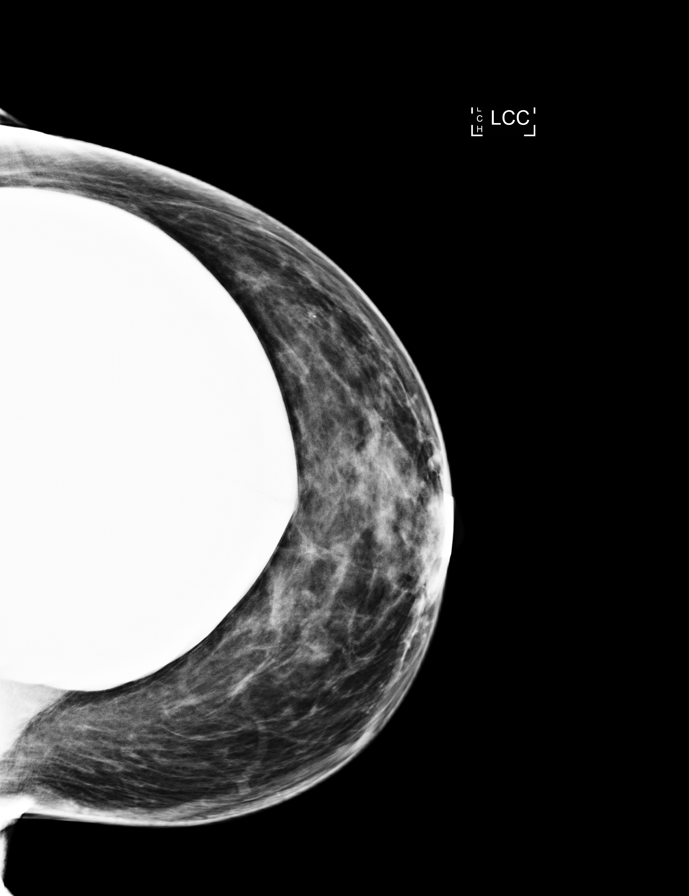

[L MLO (1 of 2)]
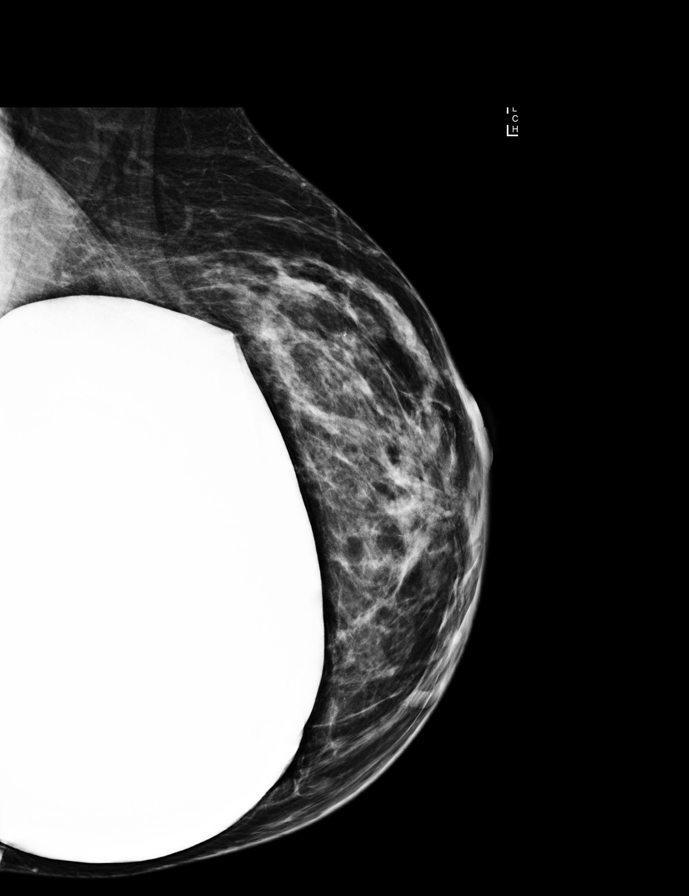

[R CC (1 of 2)]
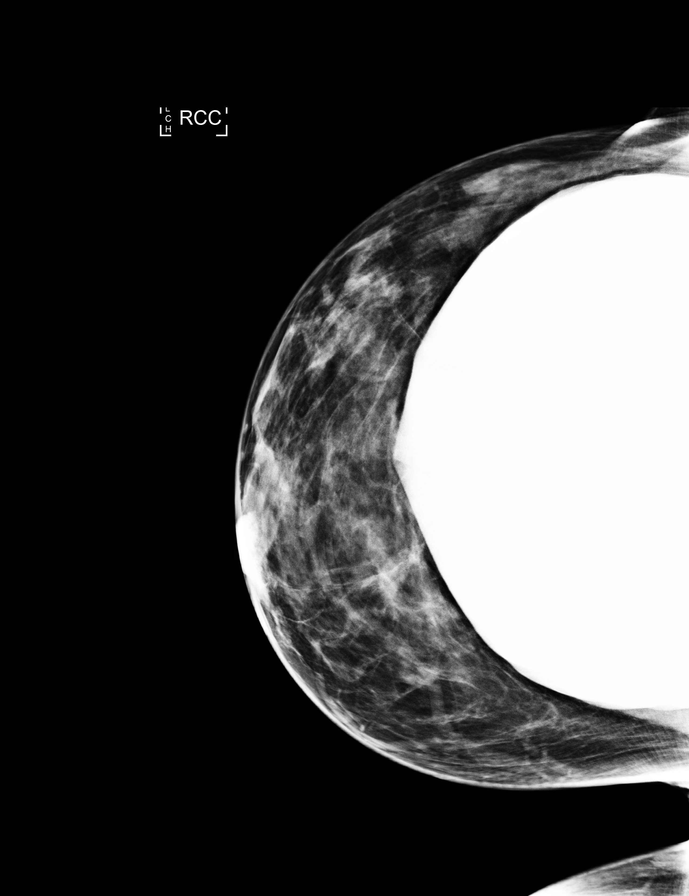

[R MLO (2 of 2)]
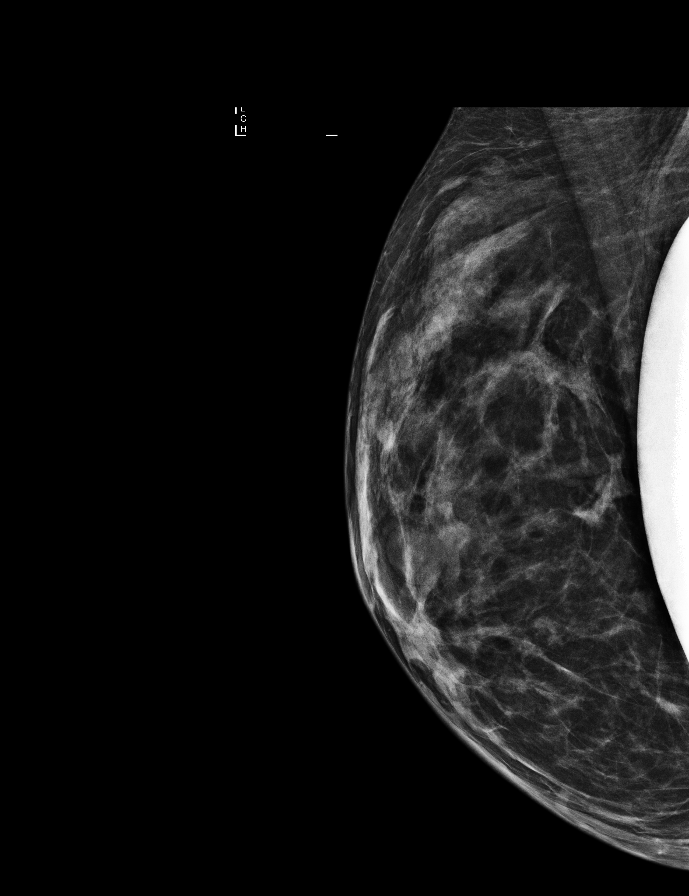

[L MLO (2 of 2)]
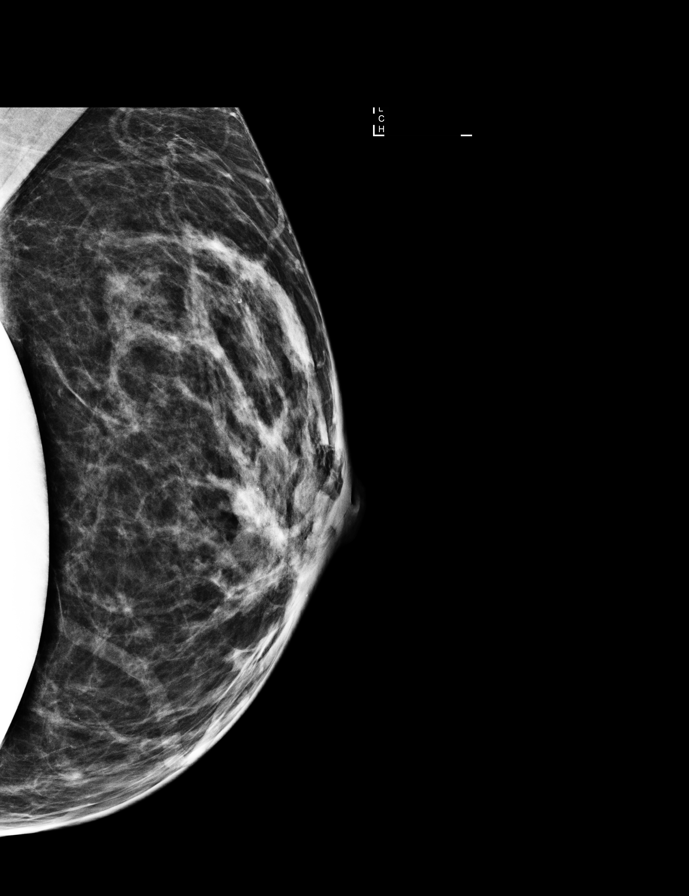

[R CC synth-2D]
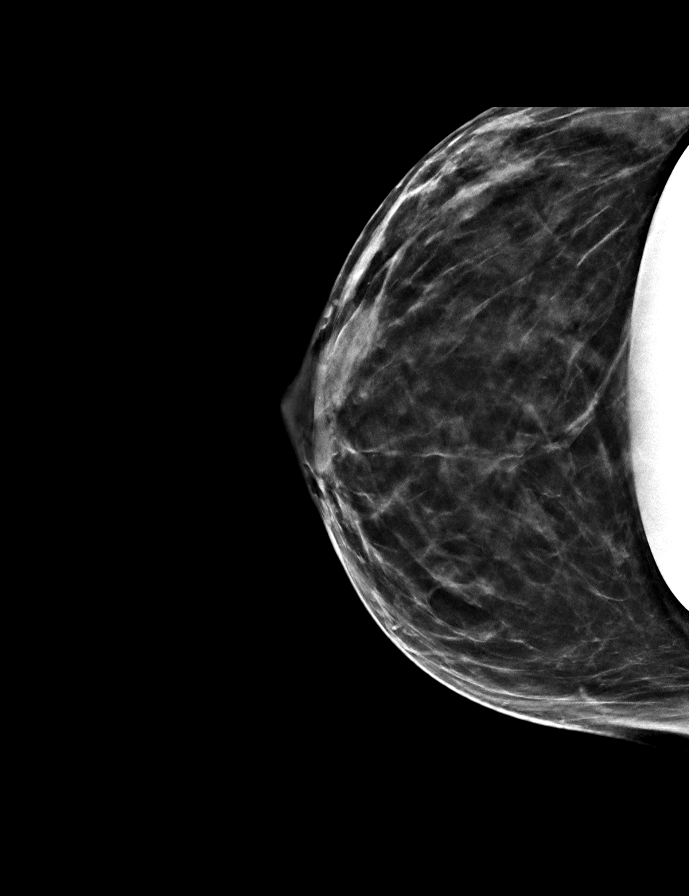

[R CC (2 of 2)]
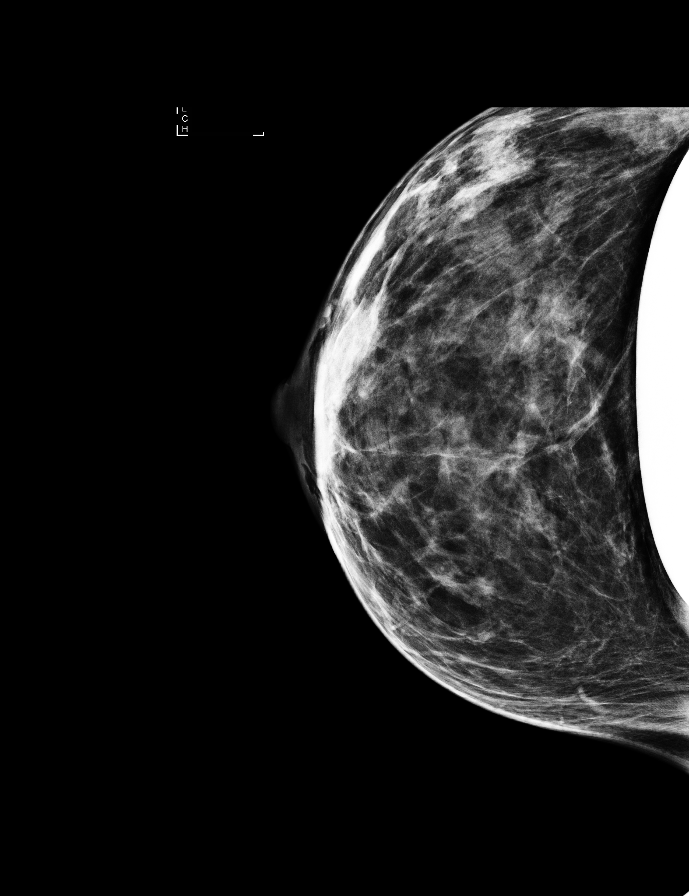

[L CC synth-2D]
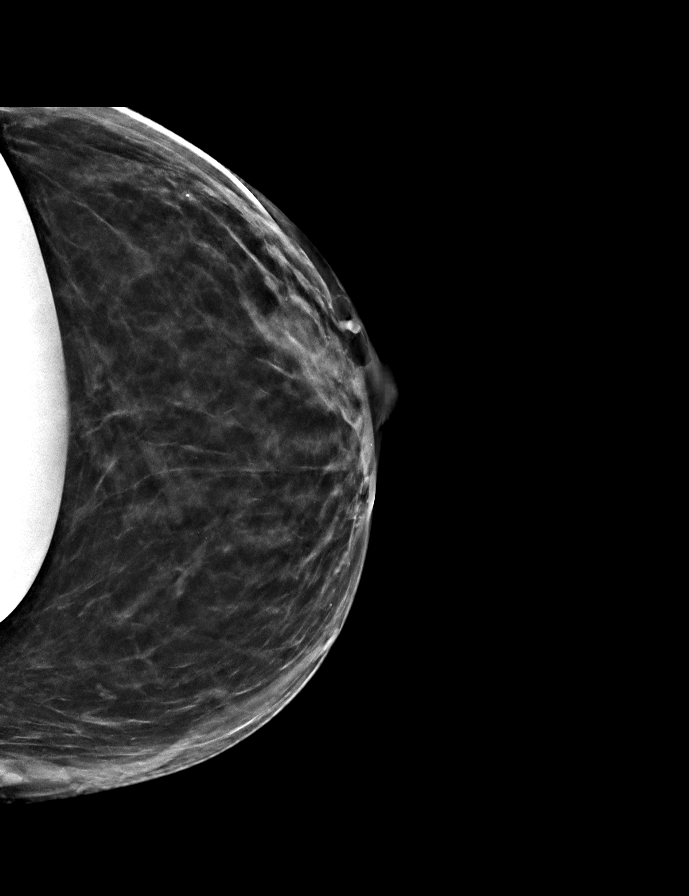

[9 of 32 positions shown; findings below may reference images not displayed]

ACR Breast Density Category c: The breast tissue is heterogeneously
dense, which may obscure small masses.
FINDINGS: There are no findings suspicious for malignancy. Images were
processed with CAD.
IMPRESSION: No mammographic evidence of malignancy. A result letter of this
screening mammogram will be mailed directly to the patient.

RECOMMENDATION:
Screening mammogram in one year. (Code:[facility]-IRI)

BI-RADS CATEGORY  1:  Negative.

## 2016-11-06 ENCOUNTER — Other Ambulatory Visit: Payer: Self-pay | Admitting: Certified Nurse Midwife

## 2016-11-06 MED ORDER — ESTRADIOL 0.5 MG PO TABS: 0.5000 mg | ORAL_TABLET | Freq: Every day | ORAL | 0 refills | Status: DC

## 2016-11-08 ENCOUNTER — Encounter: Payer: Self-pay | Admitting: Family Medicine

## 2016-11-17 ENCOUNTER — Encounter: Payer: Self-pay | Admitting: Family Medicine

## 2016-11-27 ENCOUNTER — Encounter: Payer: BC Managed Care – PPO | Admitting: Family Medicine

## 2016-12-06 ENCOUNTER — Other Ambulatory Visit: Payer: Self-pay | Admitting: Obstetrics & Gynecology

## 2016-12-06 ENCOUNTER — Ambulatory Visit (INDEPENDENT_AMBULATORY_CARE_PROVIDER_SITE_OTHER): Payer: BC Managed Care – PPO | Admitting: Family Medicine

## 2016-12-06 VITALS — BP 155/96 | HR 68 | Ht 67.25 in | Wt 168.0 lb

## 2016-12-06 DIAGNOSIS — G4700 Insomnia, unspecified: Secondary | ICD-10-CM | POA: Diagnosis not present

## 2016-12-06 DIAGNOSIS — I10 Essential (primary) hypertension: Secondary | ICD-10-CM

## 2016-12-06 DIAGNOSIS — Z13220 Encounter for screening for lipoid disorders: Secondary | ICD-10-CM | POA: Diagnosis not present

## 2016-12-06 DIAGNOSIS — Z1329 Encounter for screening for other suspected endocrine disorder: Secondary | ICD-10-CM | POA: Diagnosis not present

## 2016-12-06 DIAGNOSIS — Z0000 Encounter for general adult medical examination without abnormal findings: Secondary | ICD-10-CM

## 2016-12-06 DIAGNOSIS — Z131 Encounter for screening for diabetes mellitus: Secondary | ICD-10-CM | POA: Diagnosis not present

## 2016-12-06 DIAGNOSIS — Z1231 Encounter for screening mammogram for malignant neoplasm of breast: Secondary | ICD-10-CM

## 2016-12-06 LAB — URINALYSIS, ROUTINE W REFLEX MICROSCOPIC
Bilirubin, UA: NEGATIVE
Glucose, UA: NEGATIVE
Ketones, UA: NEGATIVE
Leukocytes, UA: NEGATIVE
Nitrite, UA: NEGATIVE
Protein, UA: NEGATIVE
Specific Gravity, UA: 1.02 (ref 1.005–1.030)
Urobilinogen, Ur: 0.2 mg/dL (ref 0.2–1.0)
pH, UA: 6 (ref 5.0–7.5)

## 2016-12-06 LAB — MICROSCOPIC EXAMINATION: Bacteria, UA: NONE SEEN

## 2016-12-06 MED ORDER — HYDROXYZINE HCL 25 MG PO TABS: 25.0000 mg | ORAL_TABLET | Freq: Every evening | ORAL | 2 refills | Status: DC | PRN

## 2016-12-06 MED ORDER — AMLODIPINE-OLMESARTAN 5-40 MG PO TABS: 1.0000 | ORAL_TABLET | Freq: Every day | ORAL | 2 refills | Status: DC

## 2016-12-06 NOTE — Progress Notes (Signed)
BP (!) 155/96   Pulse 68   Ht 5' 7.25" (1.708 m)   Wt 168 lb (76.2 kg)   SpO2 98%   BMI 26.12 kg/m    Subjective:    Patient ID: Carolyn MerrittsElizabeth A Wood, female    DOB: 12/21/1960, 56 y.o.   MRN: [id number]  HPI: Carolyn Wood is a 56 y.o. female  Chief Complaint  Patient presents with  . Annual Exam   Patient with very stressful job in very stressful situations routinely 40 hours plus a week. Blood pressure goes up with stress. Taking Benzapril without problems Other issues taking estradiol for menopausal symptoms still having some menopause symptoms in spite of taking medications. Left hip with arthritis takes Celebrex pretty much every day. 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 Interim medical history since our last visit reviewed. Allergies and medications reviewed and updated.  Review of Systems  Constitutional: Negative.   HENT: Negative.   Eyes: Negative.   Respiratory: Negative.   Cardiovascular: Negative.   Gastrointestinal: Negative.   Endocrine: Negative.   Genitourinary: Negative.   Musculoskeletal: Negative.   Skin: Negative.   Allergic/Immunologic: Negative.   Neurological: Negative.   Hematological: Negative.   Psychiatric/Behavioral: Negative.     Per HPI unless specifically indicated above     Objective:    BP (!) 155/96   Pulse 68   Ht 5' 7.25" (1.708 m)   Wt 168 lb (76.2 kg)   SpO2 98%   BMI 26.12 kg/m   Wt Readings from Last 3 Encounters:  12/06/16 168 lb (76.2 kg)  11/17/15 153 lb (69.4 kg)  10/05/15 163 lb (73.9 kg)    Physical Exam  Constitutional: She is oriented to person, place, and time. She appears well-developed and well-nourished.  HENT:  Head: Normocephalic and atraumatic.  Right Ear: External ear normal.  Left Ear: External ear normal.  Nose: Nose normal.  Mouth/Throat: Oropharynx is clear and moist.  Eyes:  Conjunctivae and EOM are normal. Pupils are equal, round, and reactive to light.  Neck: Normal range of motion. Neck supple. Carotid bruit is not present.  Cardiovascular: Normal rate, regular rhythm and normal heart sounds.   No murmur heard. Pulmonary/Chest: Effort normal and breath sounds normal.  Abdominal: Soft. Bowel sounds are normal. There is no hepatosplenomegaly.  Musculoskeletal: Normal range of motion.  Neurological: She is alert and oriented to person, place, and time.  Skin: No rash noted.  Psychiatric: She has a normal mood and affect. Her behavior is normal. Judgment and thought content normal.    Results for orders placed or performed in visit on 03/22/15  CBC with Differential/Platelet  Result Value Ref Range   WBC 9.1 3.4 - 10.8 x10E3/uL   RBC 4.42 3.77 - 5.28 x10E6/uL   Hemoglobin 13.4 11.1 - 15.9 g/dL   Hematocrit 40.941.4 81.134.0 - 46.6 %   MCV 94 79 - 97 fL   MCH 30.3 26.6 - 33.0 pg   MCHC 32.4 31.5 - 35.7 g/dL   RDW 91.414.0 78.212.3 - 95.615.4 %   Platelets 289 150 - 379 x10E3/uL   Neutrophils 58 %   Lymphs 34 %   Monocytes 8 %   Eos 0 %   Basos 0 %   Neutrophils Absolute 5.2 1.4 - 7.0 x10E3/uL   Lymphocytes Absolute 3.1 0.7 - 3.1 x10E3/uL   Monocytes Absolute 0.7 0.1 - 0.9 x10E3/uL   EOS (ABSOLUTE) 0.0  0.0 - 0.4 x10E3/uL   Basophils Absolute 0.0 0.0 - 0.2 x10E3/uL   Immature Granulocytes 0 %   Immature Grans (Abs) 0.0 0.0 - 0.1 x10E3/uL  Comprehensive metabolic panel  Result Value Ref Range   Glucose 94 65 - 99 mg/dL   BUN 8 6 - 24 mg/dL   Creatinine, Ser 2.95 0.57 - 1.00 mg/dL   GFR calc non Af Amer 97 >59 mL/min/1.73   GFR calc Af Amer 112 >59 mL/min/1.73   BUN/Creatinine Ratio 11 9 - 23   Sodium 139 134 - 144 mmol/L   Potassium 4.1 3.5 - 5.2 mmol/L   Chloride 98 97 - 108 mmol/L   CO2 26 18 - 29 mmol/L   Calcium 9.1 8.7 - 10.2 mg/dL   Total Protein 6.8 6.0 - 8.5 g/dL   Albumin 4.3 3.5 - 5.5 g/dL   Globulin, Total 2.5 1.5 - 4.5 g/dL   Albumin/Globulin Ratio  1.7 1.1 - 2.5   Bilirubin Total 0.2 0.0 - 1.2 mg/dL   Alkaline Phosphatase 43 39 - 117 IU/L   AST 28 0 - 40 IU/L   ALT 16 0 - 32 IU/L  Lipid Panel w/o Chol/HDL Ratio  Result Value Ref Range   Cholesterol, Total 205 (H) 100 - 199 mg/dL   Triglycerides 621 0 - 149 mg/dL   HDL 63 >30 mg/dL   VLDL Cholesterol Cal 30 5 - 40 mg/dL   LDL Calculated 865 (H) 0 - 99 mg/dL  TSH  Result Value Ref Range   TSH 0.898 0.450 - 4.500 uIU/mL  Urinalysis, Routine w reflex microscopic (not at [facility])  Result Value Ref Range   Specific Gravity, UA 1.015 1.005 - 1.030   pH, UA 7.5 5.0 - 7.5   Color, UA Yellow Yellow   Appearance Ur Cloudy (A) Clear   Leukocytes, UA Negative Negative   Protein, UA Negative Negative/Trace   Glucose, UA Negative Negative   Ketones, UA Trace (A) Negative   RBC, UA Negative Negative   Bilirubin, UA Negative Negative   Urobilinogen, Ur 1.0 0.2 - 1.0 mg/dL   Nitrite, UA Negative Negative      Assessment & Plan:   Problem List Items Addressed This Visit      Cardiovascular and Mediastinum   Essential hypertension    Hypertension poor control patient only wanted to take one medication not interested in taking a diuretic will use on losartan and amlodipine 5/40      Relevant Medications   amLODipine-olmesartan (AZOR) 5-40 MG tablet   Other Relevant Orders   CBC with Differential/Platelet   Comprehensive metabolic panel     Other   Insomnia    Hydroxyzine seemed to work well will give patient a prescription and she wants refills.       Other Visit Diagnoses    Annual physical exam    -  Primary   Screening cholesterol level       Relevant Orders   Lipid panel   Thyroid disorder screen       Relevant Orders   TSH   Screening for diabetes mellitus (DM)       Relevant Orders   Comprehensive metabolic panel   Urinalysis, Routine w reflex microscopic       Follow up plan: Return in about 4 weeks (around 01/03/2017) for BMP.

## 2016-12-06 NOTE — Assessment & Plan Note (Signed)
Hydroxyzine seemed to work well will give patient a prescription and she wants refills.

## 2016-12-06 NOTE — Assessment & Plan Note (Addendum)
Hypertension poor control patient only wanted to take one medication not interested in taking a diuretic will use on losartan and amlodipine 5/40

## 2016-12-07 ENCOUNTER — Encounter: Payer: Self-pay | Admitting: Family Medicine

## 2016-12-07 LAB — COMPREHENSIVE METABOLIC PANEL
ALT: 18 IU/L (ref 0–32)
AST: 28 IU/L (ref 0–40)
Albumin/Globulin Ratio: 2.1 (ref 1.2–2.2)
Albumin: 4.4 g/dL (ref 3.5–5.5)
Alkaline Phosphatase: 46 IU/L (ref 39–117)
BUN/Creatinine Ratio: 15 (ref 9–23)
BUN: 10 mg/dL (ref 6–24)
Bilirubin Total: 0.3 mg/dL (ref 0.0–1.2)
CO2: 24 mmol/L (ref 20–29)
Calcium: 8.9 mg/dL (ref 8.7–10.2)
Chloride: 103 mmol/L (ref 96–106)
Creatinine, Ser: 0.65 mg/dL (ref 0.57–1.00)
GFR calc Af Amer: 117 mL/min/{1.73_m2} (ref >59)
GFR calc non Af Amer: 102 mL/min/{1.73_m2} (ref >59)
Globulin, Total: 2.1 g/dL (ref 1.5–4.5)
Glucose: 95 mg/dL (ref 65–99)
Potassium: 4.3 mmol/L (ref 3.5–5.2)
Sodium: 139 mmol/L (ref 134–144)
Total Protein: 6.5 g/dL (ref 6.0–8.5)

## 2016-12-07 LAB — CBC WITH DIFFERENTIAL/PLATELET
Basophils Absolute: 0 10*3/uL (ref 0.0–0.2)
Basos: 1 %
EOS (ABSOLUTE): 0.1 10*3/uL (ref 0.0–0.4)
Eos: 2 %
Hematocrit: 39.8 % (ref 34.0–46.6)
Hemoglobin: 12.8 g/dL (ref 11.1–15.9)
Immature Grans (Abs): 0 10*3/uL (ref 0.0–0.1)
Immature Granulocytes: 0 %
Lymphocytes Absolute: 3.4 10*3/uL — ABNORMAL HIGH (ref 0.7–3.1)
Lymphs: 49 %
MCH: 30.6 pg (ref 26.6–33.0)
MCHC: 32.2 g/dL (ref 31.5–35.7)
MCV: 95 fL (ref 79–97)
Monocytes Absolute: 0.5 10*3/uL (ref 0.1–0.9)
Monocytes: 7 %
Neutrophils Absolute: 2.8 10*3/uL (ref 1.4–7.0)
Neutrophils: 41 %
Platelets: 276 10*3/uL (ref 150–379)
RBC: 4.18 x10E6/uL (ref 3.77–5.28)
RDW: 14.3 % (ref 12.3–15.4)
WBC: 6.8 10*3/uL (ref 3.4–10.8)

## 2016-12-07 LAB — LIPID PANEL
Chol/HDL Ratio: 2.8 ratio (ref 0.0–4.4)
Cholesterol, Total: 224 mg/dL — ABNORMAL HIGH (ref 100–199)
HDL: 81 mg/dL (ref >39)
LDL Calculated: 130 mg/dL — ABNORMAL HIGH (ref 0–99)
Triglycerides: 63 mg/dL (ref 0–149)
VLDL Cholesterol Cal: 13 mg/dL (ref 5–40)

## 2016-12-07 LAB — TSH: TSH: 2.02 u[IU]/mL (ref 0.450–4.500)

## 2017-01-08 ENCOUNTER — Ambulatory Visit (INDEPENDENT_AMBULATORY_CARE_PROVIDER_SITE_OTHER): Payer: BC Managed Care – PPO | Admitting: Obstetrics & Gynecology

## 2017-01-08 ENCOUNTER — Encounter: Payer: Self-pay | Admitting: Obstetrics & Gynecology

## 2017-01-08 VITALS — BP 124/72 | HR 74 | Ht 68.0 in | Wt 187.0 lb

## 2017-01-08 DIAGNOSIS — Z01419 Encounter for gynecological examination (general) (routine) without abnormal findings: Secondary | ICD-10-CM

## 2017-01-08 DIAGNOSIS — Z124 Encounter for screening for malignant neoplasm of cervix: Secondary | ICD-10-CM

## 2017-01-08 DIAGNOSIS — Z1231 Encounter for screening mammogram for malignant neoplasm of breast: Secondary | ICD-10-CM

## 2017-01-08 DIAGNOSIS — Z1211 Encounter for screening for malignant neoplasm of colon: Secondary | ICD-10-CM | POA: Diagnosis not present

## 2017-01-08 DIAGNOSIS — Z1239 Encounter for other screening for malignant neoplasm of breast: Secondary | ICD-10-CM

## 2017-01-08 DIAGNOSIS — Z0000 Encounter for general adult medical examination without abnormal findings: Secondary | ICD-10-CM | POA: Insufficient documentation

## 2017-01-08 MED ORDER — ESTRADIOL 0.5 MG PO TABS: 0.5000 mg | ORAL_TABLET | Freq: Every day | ORAL | 3 refills | Status: DC

## 2017-01-08 NOTE — Progress Notes (Signed)
HPI:      [name] is a 56 y.o. No obstetric history on file. who LMP was in the past, she presents today for her annual examination.  The patient has no complaints today. The patient is sexually active. Herlast pap: approximate date 2015 and was normal and last mammogram: approximate date 2017 and was normal.  The patient does perform self breast exams.  There is no notable family history of breast or ovarian cancer in her family. The patient is taking hormone replacement therapy. Patient denies post-menopausal vaginal bleeding.   The patient has regular exercise: yes. The patient denies current symptoms of depression.  No vulvar itching or burning.  GYN Hx: Last Colonoscopy:3 years ago. Normal.  Last DEXA: never ago.    PMHx: Past Medical History:  Diagnosis Date  . Anxiety   . Arthritis   . Complication of anesthesia    combative when waking up  . History of Papanicolaou smear of cervix 11/05/2013   RNIL/NEG  . Seasonal allergies    Past Surgical History:  Procedure Laterality Date  . ABDOMINAL HYSTERECTOMY    . AUGMENTATION MAMMAPLASTY Bilateral 2015   silicone  . BUNIONECTOMY Bilateral   . CESAREAN SECTION    . chin implant    . cyst removed     tail bone  . JOINT REPLACEMENT Right    hip  . KNEE ARTHROSCOPY Right 1989   "dont know what they did"  . LIPOSUCTION     on neck and chin  . NASAL SEPTUM SURGERY Right 07/2016  . TONSILLECTOMY    . TOTAL HIP ARTHROPLASTY Right 10/29/2012   Procedure: RIGHT TOTAL HIP ARTHROPLASTY ANTERIOR APPROACH;  Surgeon: Shelda PalMatthew D Olin, MD;  Location: [facility];  Service: Orthopedics;  Laterality: Right;   Family History  Problem Relation Age of Onset  . Cancer Mother        kidney  . Diabetes Mother   . Hypertension Mother   . Breast cancer Neg Hx   . Ovarian cancer Neg Hx    Social History  Substance Use Topics  . Smoking status: Former Smoker    Packs/day: 0.50    Years: 20.00    Types: Cigarettes, Cigars  .  Smokeless tobacco: Never Used  . Alcohol use Yes    Current Outpatient Prescriptions:  .  amLODipine-olmesartan (AZOR) 5-40 MG tablet, Take 1 tablet by mouth daily., Disp: 30 tablet, Rfl: 2 .  Biotin 1000 MCG tablet, Take 1,000 mcg by mouth 3 (three) times daily., Disp: , Rfl:  .  celecoxib (CELEBREX) 200 MG capsule, Take 1 capsule (200 mg total) by mouth every 12 (twelve) hours., Disp: , Rfl:  .  estradiol (ESTRACE) 0.5 MG tablet, Take 1 tablet (0.5 mg total) by mouth daily., Disp: 90 tablet, Rfl: 3 .  ferrous sulfate 325 (65 FE) MG tablet, Take 325 mg by mouth daily with breakfast., Disp: , Rfl:  .  Fish Oil-Cholecalciferol (FISH OIL + D3 PO), Take by mouth., Disp: , Rfl:  .  Glucosamine-Chondroitin (GLUCOSAMINE CHONDR COMPLEX PO), Take by mouth., Disp: , Rfl:  .  hydrOXYzine (ATARAX/VISTARIL) 25 MG tablet, Take 1 tablet (25 mg total) by mouth at bedtime as needed., Disp: 90 tablet, Rfl: 2 .  vitamin C (ASCORBIC ACID) 500 MG tablet, Take by mouth., Disp: , Rfl:  .  vitamin E 400 UNIT capsule, Take by mouth., Disp: , Rfl:  Allergies: Patient has no known allergies.  Review of Systems  Constitutional: Negative for  chills, fever and malaise/fatigue.  HENT: Negative for congestion, sinus pain and sore throat.   Eyes: Negative for blurred vision and pain.  Respiratory: Negative for cough and wheezing.   Cardiovascular: Negative for chest pain and leg swelling.  Gastrointestinal: Negative for abdominal pain, constipation, diarrhea, heartburn, nausea and vomiting.  Genitourinary: Negative for dysuria, frequency, hematuria and urgency.  Musculoskeletal: Negative for back pain, joint pain, myalgias and neck pain.  Skin: Negative for itching and rash.  Neurological: Negative for dizziness, tremors and weakness.  Endo/Heme/Allergies: Does not bruise/bleed easily.  Psychiatric/Behavioral: Negative for depression. The patient is not nervous/anxious and does not have insomnia.     Objective: BP  124/72   Pulse 74   Ht 5\' 8"  (1.727 m)   Wt 187 lb (84.8 kg)   BMI 28.43 kg/m   Filed Weights   01/08/17 0900  Weight: 187 lb (84.8 kg)   Body mass index is 28.43 kg/m. Physical Exam  Constitutional: She is oriented to person, place, and time. She appears well-developed and well-nourished. No distress.  Genitourinary: Rectum normal and vagina normal. Pelvic exam was performed with patient supine. There is no rash or lesion on the right labia. There is no rash or lesion on the left labia.    Vagina exhibits no lesion. No bleeding in the vagina. Right adnexum does not display mass and does not display tenderness. Left adnexum does not display mass and does not display tenderness.  Genitourinary Comments: Absent Uterus Cervix normal  HENT:  Head: Normocephalic and atraumatic. Head is without laceration.  Right Ear: Hearing normal.  Left Ear: Hearing normal.  Nose: No epistaxis.  No foreign bodies.  Mouth/Throat: Uvula is midline, oropharynx is clear and moist and mucous membranes are normal.  Eyes: Pupils are equal, round, and reactive to light.  Neck: Normal range of motion. Neck supple. No thyromegaly present.  Cardiovascular: Normal rate and regular rhythm.  Exam reveals no gallop and no friction rub.   No murmur heard. Pulmonary/Chest: Effort normal and breath sounds normal. No respiratory distress. She has no wheezes. Right breast exhibits no mass, no skin change and no tenderness. Left breast exhibits no mass, no skin change and no tenderness.  Abdominal: Soft. Bowel sounds are normal. She exhibits no distension. There is no tenderness. There is no rebound.  Musculoskeletal: Normal range of motion.  Neurological: She is alert and oriented to person, place, and time. No cranial nerve deficit.  Skin: Skin is warm and dry.  Psychiatric: She has a normal mood and affect. Judgment normal.  Vitals reviewed.   Assessment: Annual Exam 1. Annual physical exam   2. Screening for  cervical cancer   3. Screening for breast cancer   4. Screen for colon cancer     Plan:            1.  Cervical Screening-  Pap smear done today, Pap smear schedule reviewed with patient  2. Breast screening- Exam annually and mammogram scheduled  3. Colonoscopy every 10 years, Hemoccult testing after age 67  4. Labs managed by PCP  5. Counseling for hormonal therapy: no change in therapy today     F/U  Return in about 1 year (around 01/08/2018) for Annual.  Annamarie Major, MD, [facility], [facility] 01/08/2017  9:22 AM

## 2017-01-08 NOTE — Patient Instructions (Signed)
PAP every three years Mammogram every year Colonoscopy every 10 years Labs yearly (with PCP)   

## 2017-01-11 LAB — IGP, APTIMA HPV
HPV Aptima: NEGATIVE
PAP Smear Comment: 0

## 2017-01-16 ENCOUNTER — Encounter: Payer: Self-pay | Admitting: Obstetrics & Gynecology

## 2017-01-16 ENCOUNTER — Ambulatory Visit
Admission: RE | Admit: 2017-01-16 | Discharge: 2017-01-16 | Disposition: A | Discharge status: Home / Self Care | Payer: BC Managed Care – PPO | Source: Ambulatory Visit | Attending: Obstetrics & Gynecology | Admitting: Obstetrics & Gynecology

## 2017-01-16 DIAGNOSIS — Z1231 Encounter for screening mammogram for malignant neoplasm of breast: Secondary | ICD-10-CM

## 2017-01-16 DIAGNOSIS — Z9882 Breast implant status: Secondary | ICD-10-CM | POA: Diagnosis not present

## 2017-01-23 ENCOUNTER — Encounter: Payer: Self-pay | Admitting: Family Medicine

## 2017-01-23 ENCOUNTER — Ambulatory Visit (INDEPENDENT_AMBULATORY_CARE_PROVIDER_SITE_OTHER): Payer: BC Managed Care – PPO | Admitting: Family Medicine

## 2017-01-23 VITALS — BP 136/80 | HR 78 | Wt 169.0 lb

## 2017-01-23 DIAGNOSIS — I10 Essential (primary) hypertension: Secondary | ICD-10-CM | POA: Diagnosis not present

## 2017-01-23 DIAGNOSIS — Z79890 Hormone replacement therapy (postmenopausal): Secondary | ICD-10-CM

## 2017-01-23 MED ORDER — AMLODIPINE-OLMESARTAN 5-40 MG PO TABS: 1.0000 | ORAL_TABLET | Freq: Every day | ORAL | 4 refills | Status: DC

## 2017-01-23 NOTE — Assessment & Plan Note (Signed)
The current medical regimen is effective;  continue present plan and medications.  

## 2017-01-23 NOTE — Assessment & Plan Note (Signed)
Discuss hormone replacement therapy with patient also encouraged to discuss with her GYN.

## 2017-01-23 NOTE — Progress Notes (Signed)
   BP 136/80 (BP Location: Left Arm)   Pulse 78   Wt 169 lb (76.7 kg)   SpO2 99%   BMI 25.70 kg/m    Subjective:    Patient ID: Carolyn Wood, female    DOB: 10/06/1960, 56 y.o.   MRN: [id number]  HPI: Carolyn Merrittslizabeth A Hollander is a 56 y.o. female  Chief Complaint  Patient presents with  . Follow-up  . Hypertension    Jaw clenching side effect.  Patient's blood pressures otherwise been doing good no complaints. Patient has some left trapezius muscle spasm and irritation is been ongoing for months getting worse wants something done associated with her jaw clenching. Patient also with numerous questions about estrogen replacement therapy safety effectiveness purpose. Patient was some urinary symptoms otherwise minimal.  Relevant past medical, surgical, family and social history reviewed and updated as indicated. Interim medical history since our last visit reviewed. Allergies and medications reviewed and updated.  Review of Systems  Constitutional: Negative.   Respiratory: Negative.   Cardiovascular: Negative.     Per HPI unless specifically indicated above     Objective:    BP 136/80 (BP Location: Left Arm)   Pulse 78   Wt 169 lb (76.7 kg)   SpO2 99%   BMI 25.70 kg/m   Wt Readings from Last 3 Encounters:  01/23/17 169 lb (76.7 kg)  01/08/17 187 lb (84.8 kg)  12/06/16 168 lb (76.2 kg)    Physical Exam  Constitutional: She is oriented to person, place, and time. She appears well-developed and well-nourished.  HENT:  Head: Normocephalic and atraumatic.  Eyes: Conjunctivae and EOM are normal.  Neck: Normal range of motion.  Cardiovascular: Normal rate, regular rhythm and normal heart sounds.   Pulmonary/Chest: Effort normal and breath sounds normal.  Musculoskeletal: Normal range of motion.  Neurological: She is alert and oriented to person, place, and time.  Skin: No erythema.  Psychiatric: She has a normal mood and affect. Her behavior is normal. Judgment  and thought content normal.    Results for orders placed or performed in visit on 01/08/17  IGP, Aptima HPV  Result Value Ref Range   DIAGNOSIS: Comment    Specimen adequacy: Comment    Clinician Provided ICD10 Comment    Performed by: Comment    QC reviewed by: Comment    PAP Smear Comment .    Note: Comment    Test Methodology Comment    HPV Aptima Negative Negative      Assessment & Plan:   Problem List Items Addressed This Visit      Cardiovascular and Mediastinum   Essential hypertension - Primary    The current medical regimen is effective;  continue present plan and medications.       Relevant Medications   amLODipine-olmesartan (AZOR) 5-40 MG tablet   Other Relevant Orders   Basic metabolic panel     Other   Hormone replacement therapy (HRT)    Discuss hormone replacement therapy with patient also encouraged to discuss with her GYN.          Follow up plan: Return in about 6 months (around 07/26/2017) for BMP.

## 2017-01-24 ENCOUNTER — Encounter: Payer: Self-pay | Admitting: Family Medicine

## 2017-01-24 LAB — BASIC METABOLIC PANEL
BUN/Creatinine Ratio: 17 (ref 9–23)
BUN: 11 mg/dL (ref 6–24)
CO2: 22 mmol/L (ref 20–29)
Calcium: 9 mg/dL (ref 8.7–10.2)
Chloride: 104 mmol/L (ref 96–106)
Creatinine, Ser: 0.66 mg/dL (ref 0.57–1.00)
GFR calc Af Amer: 117 mL/min/{1.73_m2} (ref >59)
GFR calc non Af Amer: 101 mL/min/{1.73_m2} (ref >59)
Glucose: 91 mg/dL (ref 65–99)
Potassium: 4.4 mmol/L (ref 3.5–5.2)
Sodium: 142 mmol/L (ref 134–144)

## 2017-02-23 ENCOUNTER — Encounter: Payer: Self-pay | Admitting: Family Medicine

## 2017-02-23 ENCOUNTER — Ambulatory Visit (INDEPENDENT_AMBULATORY_CARE_PROVIDER_SITE_OTHER): Payer: BC Managed Care – PPO | Admitting: Family Medicine

## 2017-02-23 VITALS — BP 140/90 | HR 70 | Ht 68.0 in | Wt 183.4 lb

## 2017-02-23 DIAGNOSIS — I10 Essential (primary) hypertension: Secondary | ICD-10-CM

## 2017-02-23 MED ORDER — METOPROLOL SUCCINATE ER 50 MG PO TB24: 50.0000 mg | ORAL_TABLET | Freq: Every day | ORAL | 0 refills | Status: DC

## 2017-02-23 NOTE — Progress Notes (Signed)
BP 140/90   Pulse 70   Ht 5\' 8"  (1.727 m)   Wt 183 lb 6.4 oz (83.2 kg)   SpO2 99%   BMI 27.89 kg/m    Subjective:    Patient ID: Carolyn Wood, female    DOB: 06/17/1961, 56 y.o.   MRN: [id number]  HPI: Carolyn Wood is a 56 y.o. female  Chief Complaint  Patient presents with  . Blood Pressure Check   Started on Azor back in June. BPs still not well controlled when checking at home, usually high normal or a bit above. Still having the urinary sxs and jaw claudication/spasms since being on this medicine. Stops it for a period of time and sxs seem to improve some. Not interested in continuing this medication. Benazepril had been working for about 10 years but stopped controlling BP well per pt. Denies CP, SOB, palpitations.   Relevant past medical, surgical, family and social history reviewed and updated as indicated. Interim medical history since our last visit reviewed. Allergies and medications reviewed and updated.  Review of Systems  Constitutional: Negative.   HENT:       Jaw muscle spasms  Respiratory: Negative.   Cardiovascular: Negative.   Gastrointestinal: Negative.   Genitourinary: Positive for frequency.  Musculoskeletal: Negative.   Neurological: Negative.   Psychiatric/Behavioral: Negative.     Per HPI unless specifically indicated above     Objective:    BP 140/90   Pulse 70   Ht 5\' 8"  (1.727 m)   Wt 183 lb 6.4 oz (83.2 kg)   SpO2 99%   BMI 27.89 kg/m   Wt Readings from Last 3 Encounters:  02/23/17 183 lb 6.4 oz (83.2 kg)  01/23/17 169 lb (76.7 kg)  01/08/17 187 lb (84.8 kg)    Physical Exam  Constitutional: She is oriented to person, place, and time. She appears well-developed and well-nourished. No distress.  HENT:  Head: Atraumatic.  Eyes: Pupils are equal, round, and reactive to light. Conjunctivae are normal.  Neck: Normal range of motion. Neck supple.  Cardiovascular: Normal rate, normal heart sounds and intact distal  pulses.   Pulmonary/Chest: Effort normal and breath sounds normal.  Musculoskeletal: Normal range of motion.  Neurological: She is alert and oriented to person, place, and time.  Skin: Skin is warm and dry.  Psychiatric: She has a normal mood and affect. Her behavior is normal.  Nursing note and vitals reviewed.   Results for orders placed or performed in visit on 01/23/17  Basic metabolic panel  Result Value Ref Range   Glucose 91 65 - 99 mg/dL   BUN 11 6 - 24 mg/dL   Creatinine, Ser 4.090.66 0.57 - 1.00 mg/dL   GFR calc non Af Amer 101 >59 mL/min/1.73   GFR calc Af Amer 117 >59 mL/min/1.73   BUN/Creatinine Ratio 17 9 - 23   Sodium 142 134 - 144 mmol/L   Potassium 4.4 3.5 - 5.2 mmol/L   Chloride 104 96 - 106 mmol/L   CO2 22 20 - 29 mmol/L   Calcium 9.0 8.7 - 10.2 mg/dL      Assessment & Plan:   Problem List Items Addressed This Visit      Cardiovascular and Mediastinum   Essential hypertension - Primary    D/c Azor d/t side effects and poor control. Pt refusing any type of diuretic at this time as she already struggles with urinary frequency. Will try 50 mg metoprolol, watching closely for bradycardia. Continue home  monitoring, DASH diet, good exercise.       Relevant Medications   metoprolol succinate (TOPROL XL) 50 MG 24 hr tablet       Follow up plan: Return in about 4 weeks (around 03/23/2017) for BP f/u.

## 2017-02-23 NOTE — Assessment & Plan Note (Addendum)
D/c Azor d/t side effects and poor control. Pt refusing any type of diuretic at this time as she already struggles with urinary frequency. Will try 50 mg metoprolol, watching closely for bradycardia. Continue home monitoring, DASH diet, good exercise.

## 2017-02-26 NOTE — Patient Instructions (Signed)
Follow up in 1 month   

## 2017-03-30 ENCOUNTER — Ambulatory Visit: Payer: BC Managed Care – PPO | Admitting: Obstetrics & Gynecology

## 2017-05-22 ENCOUNTER — Telehealth: Payer: Self-pay | Admitting: Family Medicine

## 2017-05-22 DIAGNOSIS — I10 Essential (primary) hypertension: Secondary | ICD-10-CM

## 2017-05-22 NOTE — Telephone Encounter (Signed)
Refill request for amlodipine-olmesartan / LOV 02/23/2017 with [address] /  Disp Refills Start End   amLODipine-olmesartan (AZOR) 5-40 MG tablet 90 tablet 4 01/23/2017    Sig - Route: Take 1 tablet by mouth daily. - Oral    Prescription should have refills.  Left message for paitentt and advised her to call pharmacy as refills are available.

## 2017-05-22 NOTE — Telephone Encounter (Signed)
Copied from CRM [id number]. Topic: General - Other >> May 22, 2017  3:29 PM Gerrianne ScalePayne, Luria Rosario L wrote: Reason for CRM: med refill Amlodipine 5/40 mg please send to the [facility] in [place] Patient took her last pill today

## 2017-07-03 ENCOUNTER — Encounter: Payer: Self-pay | Admitting: Family Medicine

## 2017-07-03 ENCOUNTER — Ambulatory Visit: Payer: BC Managed Care – PPO | Admitting: Family Medicine

## 2017-07-03 DIAGNOSIS — F32A Depression, unspecified: Secondary | ICD-10-CM

## 2017-07-03 DIAGNOSIS — F329 Major depressive disorder, single episode, unspecified: Secondary | ICD-10-CM | POA: Diagnosis not present

## 2017-07-03 MED ORDER — CITALOPRAM HYDROBROMIDE 20 MG PO TABS: 20.0000 mg | ORAL_TABLET | Freq: Every day | ORAL | 3 refills | Status: DC

## 2017-07-03 MED ORDER — AMITRIPTYLINE HCL 50 MG PO TABS: 25.0000 mg | ORAL_TABLET | Freq: Every day | ORAL | 3 refills | Status: DC

## 2017-07-03 MED ORDER — HYDROXYZINE HCL 25 MG PO TABS: 25.0000 mg | ORAL_TABLET | Freq: Every evening | ORAL | 2 refills | Status: DC | PRN

## 2017-07-03 NOTE — Assessment & Plan Note (Signed)
Discussed depression care and treatment use of medications. Also discussed sleep and use of medications. Cautioned about alcohol encouraged to talk with a therapist follow-up in 2 weeks

## 2017-07-03 NOTE — Progress Notes (Signed)
   BP 138/82 (BP Location: Left Arm)   Pulse 95   Wt 183 lb (83 kg) Comment: Was wearing work belt  SpO2 97%   BMI 27.83 kg/m    Subjective:    Patient ID: Carolyn Wood, female    DOB: 03/13/1961, 57 y.o.   MRN: [id number]  HPI: Carolyn Wood is a 57 y.o. female  Chief Complaint  Patient presents with  . Anxiety    New.   patient under great deal of stress with ending up relationship with lonterm boyfriendThis is been a very stressful month on the patient has not talked with therapy or is interested in talking with a therapist. Has taken hydroxyzine and alcohol to help sleep. No suicidal ideation.   Relevant past medical, surgical, family and social history reviewed and updated as indicated. Interim medical history since our last visit reviewed. Allergies and medications reviewed and updated.  Review of Systems  Constitutional: Negative.   Respiratory: Negative.   Cardiovascular: Negative.     Per HPI unless specifically indicated above     Objective:    BP 138/82 (BP Location: Left Arm)   Pulse 95   Wt 183 lb (83 kg) Comment: Was wearing work belt  SpO2 97%   BMI 27.83 kg/m   Wt Readings from Last 3 Encounters:  07/03/17 183 lb (83 kg)  02/23/17 183 lb 6.4 oz (83.2 kg)  01/23/17 169 lb (76.7 kg)    Physical Exam  Constitutional: She is oriented to person, place, and time. She appears well-developed and well-nourished.  HENT:  Head: Normocephalic and atraumatic.  Eyes: Conjunctivae and EOM are normal.  Neck: Normal range of motion.  Cardiovascular: Normal rate, regular rhythm and normal heart sounds.  Pulmonary/Chest: Effort normal and breath sounds normal.  Musculoskeletal: Normal range of motion.  Neurological: She is alert and oriented to person, place, and time.  Skin: No erythema.  Psychiatric: She has a normal mood and affect. Her behavior is normal. Judgment and thought content normal.    Results for orders placed or performed in  visit on 01/23/17  Basic metabolic panel  Result Value Ref Range   Glucose 91 65 - 99 mg/dL   BUN 11 6 - 24 mg/dL   Creatinine, Ser 4.090.66 0.57 - 1.00 mg/dL   GFR calc non Af Amer 101 >59 mL/min/1.73   GFR calc Af Amer 117 >59 mL/min/1.73   BUN/Creatinine Ratio 17 9 - 23   Sodium 142 134 - 144 mmol/L   Potassium 4.4 3.5 - 5.2 mmol/L   Chloride 104 96 - 106 mmol/L   CO2 22 20 - 29 mmol/L   Calcium 9.0 8.7 - 10.2 mg/dL      Assessment & Plan:   Problem List Items Addressed This Visit      Other   Depression    Discussed depression care and treatment use of medications. Also discussed sleep and use of medications. Cautioned about alcohol encouraged to talk with a therapist follow-up in 2 weeks      Relevant Medications   citalopram (CELEXA) 20 MG tablet   amitriptyline (ELAVIL) 50 MG tablet   hydrOXYzine (ATARAX/VISTARIL) 25 MG tablet       Follow up plan: Return in about 2 weeks (around 07/17/2017), or if symptoms worsen or fail to improve.

## 2017-09-10 ENCOUNTER — Other Ambulatory Visit: Payer: Self-pay

## 2017-09-10 ENCOUNTER — Emergency Department: Payer: No Typology Code available for payment source

## 2017-09-10 ENCOUNTER — Encounter: Payer: Self-pay | Admitting: Emergency Medicine

## 2017-09-10 ENCOUNTER — Emergency Department
Admission: EM | Admit: 2017-09-10 | Discharge: 2017-09-10 | Disposition: A | Discharge status: Home / Self Care | Payer: No Typology Code available for payment source | Attending: Emergency Medicine | Admitting: Emergency Medicine

## 2017-09-10 DIAGNOSIS — M7918 Myalgia, other site: Secondary | ICD-10-CM

## 2017-09-10 DIAGNOSIS — I10 Essential (primary) hypertension: Secondary | ICD-10-CM | POA: Diagnosis not present

## 2017-09-10 DIAGNOSIS — Y9389 Activity, other specified: Secondary | ICD-10-CM | POA: Insufficient documentation

## 2017-09-10 DIAGNOSIS — Y92410 Unspecified street and highway as the place of occurrence of the external cause: Secondary | ICD-10-CM | POA: Diagnosis not present

## 2017-09-10 DIAGNOSIS — Z79899 Other long term (current) drug therapy: Secondary | ICD-10-CM | POA: Diagnosis not present

## 2017-09-10 DIAGNOSIS — Z96641 Presence of right artificial hip joint: Secondary | ICD-10-CM | POA: Diagnosis not present

## 2017-09-10 DIAGNOSIS — Z87891 Personal history of nicotine dependence: Secondary | ICD-10-CM | POA: Diagnosis not present

## 2017-09-10 DIAGNOSIS — S199XXA Unspecified injury of neck, initial encounter: Secondary | ICD-10-CM | POA: Diagnosis present

## 2017-09-10 DIAGNOSIS — Y999 Unspecified external cause status: Secondary | ICD-10-CM | POA: Insufficient documentation

## 2017-09-10 DIAGNOSIS — S161XXA Strain of muscle, fascia and tendon at neck level, initial encounter: Secondary | ICD-10-CM | POA: Diagnosis not present

## 2017-09-10 MED ORDER — HYDROCODONE-ACETAMINOPHEN 5-325 MG PO TABS: 1.0000 | ORAL_TABLET | Freq: Four times a day (QID) | ORAL | 0 refills | Status: DC | PRN

## 2017-09-10 MED ORDER — CYCLOBENZAPRINE HCL 10 MG PO TABS: 5.0000 mg | ORAL_TABLET | Freq: Three times a day (TID) | ORAL | 0 refills | Status: DC | PRN

## 2017-09-10 MED ORDER — CYCLOBENZAPRINE HCL 10 MG PO TABS
10.0000 mg | ORAL_TABLET | Freq: Once | ORAL | Status: AC
  Administered 2017-09-10: 10 mg via ORAL
  Filled 2017-09-10: qty 1

## 2017-09-10 MED ORDER — HYDROCODONE-ACETAMINOPHEN 5-325 MG PO TABS
1.0000 | ORAL_TABLET | Freq: Once | ORAL | Status: AC
  Administered 2017-09-10: 1 via ORAL
  Filled 2017-09-10: qty 1

## 2017-09-10 MED ORDER — NABUMETONE 750 MG PO TABS: 750.0000 mg | ORAL_TABLET | Freq: Two times a day (BID) | ORAL | 0 refills | Status: DC
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
## 2017-09-10 NOTE — Discharge Instructions (Signed)
Your exam is consistent with musculoskeletal pain and strain to the cervical spine. You have no fractures or dislocations on your x-rays. You have some underlying arthritis in the cervical spine. Take the prescription meds as directed. Follow-up with your company's provider for ongoing symptoms.
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
## 2017-09-10 NOTE — ED Notes (Signed)
Spoke with supervisor Phillip Healries [facility] [phone], stated that a UDS was not needed.

## 2017-09-19 DIAGNOSIS — S46919A Strain of unspecified muscle, fascia and tendon at shoulder and upper arm level, unspecified arm, initial encounter: Secondary | ICD-10-CM | POA: Insufficient documentation

## 2017-09-19 DIAGNOSIS — S46819A Strain of other muscles, fascia and tendons at shoulder and upper arm level, unspecified arm, initial encounter: Secondary | ICD-10-CM | POA: Insufficient documentation

## 2017-09-19 DIAGNOSIS — M19019 Primary osteoarthritis, unspecified shoulder: Secondary | ICD-10-CM | POA: Insufficient documentation

## 2017-10-30 DIAGNOSIS — M7062 Trochanteric bursitis, left hip: Secondary | ICD-10-CM | POA: Insufficient documentation

## 2017-10-30 DIAGNOSIS — M169 Osteoarthritis of hip, unspecified: Secondary | ICD-10-CM | POA: Insufficient documentation

## 2017-11-27 ENCOUNTER — Other Ambulatory Visit: Payer: Self-pay | Admitting: Family Medicine

## 2017-11-29 NOTE — Telephone Encounter (Signed)
Elavil 50 mg refill request  LOV 07/03/17 with Dr. Dossie Arbourrissman     Was for a 2 week F/U.   No appts noted since 07/03/17.    [id number] - [place], [place] - [address] [place] [address]

## 2018-01-03 ENCOUNTER — Encounter: Payer: Self-pay | Admitting: Obstetrics & Gynecology

## 2018-01-03 ENCOUNTER — Ambulatory Visit (INDEPENDENT_AMBULATORY_CARE_PROVIDER_SITE_OTHER): Payer: BC Managed Care – PPO | Admitting: Obstetrics & Gynecology

## 2018-01-03 VITALS — BP 122/82 | HR 103 | Ht 68.0 in | Wt 181.0 lb

## 2018-01-03 DIAGNOSIS — Z1231 Encounter for screening mammogram for malignant neoplasm of breast: Secondary | ICD-10-CM

## 2018-01-03 DIAGNOSIS — Z0000 Encounter for general adult medical examination without abnormal findings: Secondary | ICD-10-CM | POA: Diagnosis not present

## 2018-01-03 DIAGNOSIS — Z1211 Encounter for screening for malignant neoplasm of colon: Secondary | ICD-10-CM

## 2018-01-03 DIAGNOSIS — G4709 Other insomnia: Secondary | ICD-10-CM | POA: Diagnosis not present

## 2018-01-03 DIAGNOSIS — Z1239 Encounter for other screening for malignant neoplasm of breast: Secondary | ICD-10-CM

## 2018-01-03 MED ORDER — ZOLPIDEM TARTRATE 5 MG PO TABS: 5.0000 mg | ORAL_TABLET | Freq: Every evening | ORAL | 1 refills | Status: DC | PRN

## 2018-01-03 NOTE — Patient Instructions (Signed)
PAP every three years Mammogram every year    Call [phone] to schedule at [facility] Colonoscopy every 10 years Labs yearly (with PCP) 

## 2018-01-03 NOTE — Progress Notes (Signed)
HPI:      Carolyn Wood is a 57 y.o. G2P2002 who LMP was in the past, she presents today for her annual examination.  The patient has no complaints today other than insomnia at times (stress at work). The patient is not currently sexually active. Herlast pap: approximate date 2018 and was normal and last mammogram: approximate date 2018 and was normal.  The patient does perform self breast exams.  There is no notable family history of breast or ovarian cancer in her family. The patient is not currently taking hormone replacement therapy.  She took for one year then stopped, min hot flashes.   Patient denies post-menopausal vaginal bleeding.   The patient has regular exercise: yes. The patient denies current symptoms of depression.    GYN Hx: Last Colonoscopy:4 years ago. Normal.  Last DEXA: never ago.    PMHx: Past Medical History:  Diagnosis Date  . Anxiety   . Arthritis   . Complication of anesthesia    combative when waking up  . History of Papanicolaou smear of cervix 11/05/2013   RNIL/NEG  . Seasonal allergies    Past Surgical History:  Procedure Laterality Date  . ABDOMINAL HYSTERECTOMY    . AUGMENTATION MAMMAPLASTY Bilateral 2015   silicone  . BUNIONECTOMY Bilateral   . CESAREAN SECTION    . chin implant    . cyst removed     tail bone  . JOINT REPLACEMENT Right    hip  . KNEE ARTHROSCOPY Right 1989   "dont know what they did"  . LIPOSUCTION     on neck and chin  . NASAL SEPTUM SURGERY Right 07/2016  . TONSILLECTOMY    . TOTAL HIP ARTHROPLASTY Right 10/29/2012   Procedure: RIGHT TOTAL HIP ARTHROPLASTY ANTERIOR APPROACH;  Surgeon: Shelda PalMatthew D Olin, MD;  Location: [facility];  Service: Orthopedics;  Laterality: Right;   Family History  Problem Relation Age of Onset  . Cancer Mother        kidney  . Diabetes Mother   . Hypertension Mother   . Breast cancer Neg Hx   . Ovarian cancer Neg Hx    Social History   Tobacco Use  . Smoking status: Former Smoker   Packs/day: 0.50    Years: 20.00    Pack years: 10.00    Types: Cigarettes, Cigars  . Smokeless tobacco: Never Used  Substance Use Topics  . Alcohol use: Yes  . Drug use: No    Current Outpatient Medications:  .  amLODipine-olmesartan (AZOR) 5-40 MG tablet, Take 1 tablet by mouth daily., Disp: 90 tablet, Rfl: 4 .  APPLE CIDER VINEGAR PO, Take by mouth., Disp: , Rfl:  .  celecoxib (CELEBREX) 200 MG capsule, Take 1 capsule (200 mg total) by mouth every 12 (twelve) hours. (Patient taking differently: Take 200 mg by mouth at bedtime. ), Disp: , Rfl:  .  ferrous sulfate 325 (65 FE) MG tablet, Take 325 mg by mouth every other day. , Disp: , Rfl:  .  FIBER PO, Take by mouth., Disp: , Rfl:  .  Fish Oil-Cholecalciferol (FISH OIL + D3 PO), Take by mouth., Disp: , Rfl:  .  vitamin C (ASCORBIC ACID) 500 MG tablet, Take by mouth., Disp: , Rfl:  .  amitriptyline (ELAVIL) 50 MG tablet, TAKE 1/2 TO 1 (ONE-HALF TO ONE) TABLET BY MOUTH ONCE DAILY AT BEDTIME (Patient not taking: Reported on 01/03/2018), Disp: 30 tablet, Rfl: 3 .  Biotin 1000 MCG tablet, Take 1,000 mcg  by mouth 3 (three) times daily., Disp: , Rfl:  .  citalopram (CELEXA) 20 MG tablet, Take 1 tablet (20 mg total) by mouth daily. (Patient not taking: Reported on 01/03/2018), Disp: 30 tablet, Rfl: 3 .  cyclobenzaprine (FLEXERIL) 10 MG tablet, Take 0.5 tablets (5 mg total) by mouth 3 (three) times daily as needed for muscle spasms. (Patient not taking: Reported on 01/03/2018), Disp: 15 tablet, Rfl: 0 .  Glucosamine-Chondroitin (GLUCOSAMINE CHONDR COMPLEX PO), Take by mouth., Disp: , Rfl:  .  HYDROcodone-acetaminophen (NORCO) 5-325 MG tablet, Take 1 tablet by mouth every 6 (six) hours as needed. (Patient not taking: Reported on 01/03/2018), Disp: 10 tablet, Rfl: 0 .  hydrOXYzine (ATARAX/VISTARIL) 25 MG tablet, Take 1 tablet (25 mg total) by mouth at bedtime as needed. (Patient not taking: Reported on 01/03/2018), Disp: 90 tablet, Rfl: 2 .  metoprolol  succinate (TOPROL XL) 50 MG 24 hr tablet, Take 1 tablet (50 mg total) by mouth daily. Take with or immediately following a meal. (Patient not taking: Reported on 01/03/2018), Disp: 30 tablet, Rfl: 0 .  nabumetone (RELAFEN) 750 MG tablet, Take 1 tablet (750 mg total) by mouth 2 (two) times daily. (Patient not taking: Reported on 01/03/2018), Disp: 30 tablet, Rfl: 0 .  vitamin E 400 UNIT capsule, Take by mouth., Disp: , Rfl:  .  zolpidem (AMBIEN) 5 MG tablet, Take 1 tablet (5 mg total) by mouth at bedtime as needed for sleep., Disp: 15 tablet, Rfl: 1 Allergies: Patient has no known allergies.  Review of Systems  Constitutional: Negative for chills, fever and malaise/fatigue.  HENT: Negative for congestion, sinus pain and sore throat.   Eyes: Negative for blurred vision and pain.  Respiratory: Negative for cough and wheezing.   Cardiovascular: Negative for chest pain and leg swelling.  Gastrointestinal: Positive for constipation and heartburn. Negative for abdominal pain, diarrhea, nausea and vomiting.  Genitourinary: Negative for dysuria, frequency, hematuria and urgency.  Musculoskeletal: Positive for joint pain. Negative for back pain, myalgias and neck pain.  Skin: Negative for itching and rash.  Neurological: Negative for dizziness, tremors and weakness.  Endo/Heme/Allergies: Does not bruise/bleed easily.  Psychiatric/Behavioral: Negative for depression. The patient has insomnia. The patient is not nervous/anxious.    Objective: BP 122/82   Pulse (!) 103   Ht 5\' 8"  (1.727 m)   Wt 181 lb (82.1 kg)   BMI 27.52 kg/m   Filed Weights   01/03/18 1528  Weight: 181 lb (82.1 kg)   Body mass index is 27.52 kg/m. Physical Exam  Constitutional: She is oriented to person, place, and time. She appears well-developed and well-nourished. No distress.  Genitourinary: Rectum normal, vagina normal and uterus normal. Pelvic exam was performed with patient supine. There is no rash or lesion on the right  labia. There is no rash or lesion on the left labia. Vagina exhibits no lesion. No bleeding in the vagina. Right adnexum does not display mass and does not display tenderness. Left adnexum does not display mass and does not display tenderness. Cervix does not exhibit motion tenderness, lesion, friability or polyp.   Uterus is mobile and midaxial. Uterus is not enlarged or exhibiting a mass.  HENT:  Head: Normocephalic and atraumatic. Head is without laceration.  Right Ear: Hearing normal.  Left Ear: Hearing normal.  Nose: No epistaxis.  No foreign bodies.  Mouth/Throat: Uvula is midline, oropharynx is clear and moist and mucous membranes are normal.  Eyes: Pupils are equal, round, and reactive to light.  Neck: Normal range of motion. Neck supple. No thyromegaly present.  Cardiovascular: Normal rate and regular rhythm. Exam reveals no gallop and no friction rub.  No murmur heard. Pulmonary/Chest: Effort normal and breath sounds normal. No respiratory distress. She has no wheezes. Right breast exhibits no mass, no skin change and no tenderness. Left breast exhibits no mass, no skin change and no tenderness.  Abdominal: Soft. Bowel sounds are normal. She exhibits no distension. There is no tenderness. There is no rebound.  Musculoskeletal: Normal range of motion.  Neurological: She is alert and oriented to person, place, and time. No cranial nerve deficit.  Skin: Skin is warm and dry.  Psychiatric: She has a normal mood and affect. Judgment normal.  Vitals reviewed.  Assessment:Annual Exam 1. Annual physical exam   2. Screening for breast cancer   3. Screen for colon cancer   4. Other insomnia    Plan:            1.  Cervical Screening-  Pap smear schedule reviewed with patient  2. Breast screening- Exam annually and mammogram scheduled  3. Colonoscopy every 10 years, Hemoccult testing after age 31  4. Labs managed by PCP  5. Counseling for hormonal therapy: none  6. Insomnia for  stress of work.  Discussed pros and cons of medicine therapy, not every night.  She plans to use once weekly or as needed. Rx Ambien #15.    F/U  Return in about 1 year (around 01/04/2019) for Annual.  Annamarie Major, MD, [facility], [facility] 01/03/2018  3:53 PM

## 2018-01-14 LAB — SPECIMEN STATUS REPORT

## 2018-01-14 LAB — FECAL OCCULT BLOOD, IMMUNOCHEMICAL: Fecal Occult Bld: NEGATIVE

## 2018-01-31 ENCOUNTER — Ambulatory Visit
Admission: RE | Admit: 2018-01-31 | Discharge: 2018-01-31 | Disposition: A | Discharge status: Home / Self Care | Payer: BC Managed Care – PPO | Source: Ambulatory Visit | Attending: Obstetrics & Gynecology | Admitting: Obstetrics & Gynecology

## 2018-01-31 DIAGNOSIS — Z1231 Encounter for screening mammogram for malignant neoplasm of breast: Secondary | ICD-10-CM | POA: Diagnosis not present

## 2018-01-31 DIAGNOSIS — Z1239 Encounter for other screening for malignant neoplasm of breast: Secondary | ICD-10-CM

## 2018-02-04 ENCOUNTER — Other Ambulatory Visit
Admission: RE | Admit: 2018-02-04 | Discharge: 2018-02-04 | Disposition: A | Discharge status: Home / Self Care | Payer: BC Managed Care – PPO | Source: Ambulatory Visit | Attending: Orthopedic Surgery | Admitting: Orthopedic Surgery

## 2018-02-04 DIAGNOSIS — M25461 Effusion, right knee: Secondary | ICD-10-CM | POA: Diagnosis not present

## 2018-02-04 LAB — SYNOVIAL CELL COUNT + DIFF, W/ CRYSTALS
Eosinophils-Synovial: 0 %
Lymphocytes-Synovial Fld: 12 %
Monocyte-Macrophage-Synovial Fluid: 0 %
Neutrophil, Synovial: 88 %
Other Cells-SYN: 0
WBC, Synovial: 12354 /mm3 — ABNORMAL HIGH (ref 0–200)

## 2018-02-05 ENCOUNTER — Encounter: Payer: Self-pay | Admitting: Obstetrics & Gynecology

## 2018-02-08 LAB — BODY FLUID CULTURE: Culture: NO GROWTH

## 2018-02-11 ENCOUNTER — Other Ambulatory Visit: Payer: Self-pay

## 2018-02-11 ENCOUNTER — Encounter: Payer: Self-pay | Admitting: Family Medicine

## 2018-02-11 ENCOUNTER — Ambulatory Visit (INDEPENDENT_AMBULATORY_CARE_PROVIDER_SITE_OTHER): Payer: BC Managed Care – PPO | Admitting: Family Medicine

## 2018-02-11 VITALS — BP 134/91 | HR 93 | Temp 98.3°F | Ht 68.0 in | Wt 171.0 lb

## 2018-02-11 DIAGNOSIS — I10 Essential (primary) hypertension: Secondary | ICD-10-CM

## 2018-02-11 DIAGNOSIS — E041 Nontoxic single thyroid nodule: Secondary | ICD-10-CM

## 2018-02-11 DIAGNOSIS — Z23 Encounter for immunization: Secondary | ICD-10-CM

## 2018-02-11 DIAGNOSIS — F329 Major depressive disorder, single episode, unspecified: Secondary | ICD-10-CM

## 2018-02-11 DIAGNOSIS — F32A Depression, unspecified: Secondary | ICD-10-CM

## 2018-02-11 MED ORDER — FLUOXETINE HCL 20 MG PO TABS: 20.0000 mg | ORAL_TABLET | Freq: Every day | ORAL | 3 refills | Status: DC

## 2018-02-11 MED ORDER — VALACYCLOVIR HCL 500 MG PO TABS: 500.0000 mg | ORAL_TABLET | Freq: Two times a day (BID) | ORAL | 6 refills | Status: DC

## 2018-02-11 MED ORDER — AMLODIPINE-OLMESARTAN 5-40 MG PO TABS: 1.0000 | ORAL_TABLET | Freq: Every day | ORAL | 1 refills | Status: DC

## 2018-02-11 NOTE — Progress Notes (Signed)
BP (!) 134/91   Pulse 93   Temp 98.3 F (36.8 C) (Oral)   Ht 5\' 8"  (1.727 m)   Wt 171 lb (77.6 kg)   SpO2 96%   BMI 26.00 kg/m    Subjective:    Patient ID: Carolyn Wood, female    DOB: 02/09/1961, 57 y.o.   MRN: [id number]  HPI: Carolyn Wood is a 57 y.o. female  Chief Complaint  Patient presents with  . Referral    for endocrinologist/ pt states while doing the MRI for her neck  they found a mass on her thyroid   Pt here today to discuss an incidental finding on her recent c spine MRI through [facility]. Found right thyroid nodule that was 1.2 cm. Asymptomatic, no hx of abnormal TSH.   Was previously on celexa for her depression, wanting to get back on something but celexa constipated her and caused dry mouth. Has tolerated prozac in the past. Depression screen [facility] 2/9 02/11/2018 07/03/2017 01/23/2017  Decreased Interest 2 0 0  Down, Depressed, Hopeless 2 0 0  PHQ - 2 Score 4 0 0  Altered sleeping 2 - -  Tired, decreased energy 2 - -  Change in appetite 2 - -  Feeling bad or failure about yourself  1 - -  Trouble concentrating 1 - -  Moving slowly or fidgety/restless 0 - -  Suicidal thoughts 0 - -  PHQ-9 Score 12 - -  Difficult doing work/chores Somewhat difficult - -     Relevant past medical, surgical, family and social history reviewed and updated as indicated. Interim medical history since our last visit reviewed. Allergies and medications reviewed and updated.  Review of Systems  Per HPI unless specifically indicated above     Objective:    BP (!) 134/91   Pulse 93   Temp 98.3 F (36.8 C) (Oral)   Ht 5\' 8"  (1.727 m)   Wt 171 lb (77.6 kg)   SpO2 96%   BMI 26.00 kg/m   Wt Readings from Last 3 Encounters:  02/11/18 171 lb (77.6 kg)  01/03/18 181 lb (82.1 kg)  09/10/17 165 lb (74.8 kg)    Physical Exam  Constitutional: She is oriented to person, place, and time. She appears well-developed and well-nourished. No distress.    HENT:  Head: Atraumatic.  Eyes: Conjunctivae and EOM are normal.  Neck: Normal range of motion. Neck supple.  Cardiovascular: Normal rate and regular rhythm.  Pulmonary/Chest: Effort normal and breath sounds normal.  Musculoskeletal: Normal range of motion.  Neurological: She is alert and oriented to person, place, and time.  Skin: Skin is warm and dry.  Psychiatric: She has a normal mood and affect. Her behavior is normal.  Nursing note and vitals reviewed.  Results for orders placed or performed during the hospital encounter of 02/04/18  Body fluid culture  Result Value Ref Range   Specimen Description      KNEE Performed at [facility], [address] [place], [place] [address]    Special Requests      NONE Performed at [facility], [address] [place], [place] [address]    Gram Stain      ABUNDANT WBC PRESENT, PREDOMINANTLY PMN NO ORGANISMS SEEN    Culture      NO GROWTH 3 DAYS Performed at [facility], [address] [place], [place] [address]    Report Status 02/08/2018 FINAL   Synovial cell count + diff,  w/ crystals  Result Value Ref Range   Color, Synovial YELLOW (A) YELLOW   Appearance-Synovial TURBID (A) CLEAR   Crystals, Fluid CA PYROPHOS DIHYDRATE    WBC, Synovial 12,354 (H) 0 - 200 /cu mm   Neutrophil, Synovial 88 %   Lymphocytes-Synovial Fld 12 %   Monocyte-Macrophage-Synovial Fluid 0 %   Eosinophils-Synovial 0 %   Other Cells-SYN 0       Assessment & Plan:   Problem List Items Addressed This Visit      Cardiovascular and Mediastinum   Essential hypertension    BPs under fairly good control. Feels it's up now because she's stressed about the thyroid finding. Continue current regimen      Relevant Medications   amLODipine-olmesartan (AZOR) 5-40 MG tablet     Other   Depression    Start prozac at 20 mg and monitor closely for benefit. Consider counseling prn. F/u in 1 month      Relevant Medications    FLUoxetine (PROZAC) 20 MG tablet    Other Visit Diagnoses    Thyroid nodule    -  Primary   Will refer to endocrinology for further management.    Relevant Orders   Ambulatory referral to Endocrinology   Flu vaccine need           Follow up plan: Return for CPE.

## 2018-02-11 NOTE — Patient Instructions (Signed)
Follow up for CPE 

## 2018-02-11 NOTE — Assessment & Plan Note (Signed)
BPs under fairly good control. Feels it's up now because she's stressed about the thyroid finding. Continue current regimen

## 2018-02-11 NOTE — Assessment & Plan Note (Signed)
Start prozac at 20 mg and monitor closely for benefit. Consider counseling prn. F/u in 1 month

## 2018-04-17 ENCOUNTER — Encounter: Payer: Self-pay | Admitting: Family Medicine

## 2018-04-17 ENCOUNTER — Ambulatory Visit (INDEPENDENT_AMBULATORY_CARE_PROVIDER_SITE_OTHER): Payer: BC Managed Care – PPO | Admitting: Family Medicine

## 2018-04-17 VITALS — BP 136/86 | HR 97 | Temp 98.1°F | Ht 68.0 in | Wt 188.1 lb

## 2018-04-17 DIAGNOSIS — G4709 Other insomnia: Secondary | ICD-10-CM | POA: Diagnosis not present

## 2018-04-17 DIAGNOSIS — Z1159 Encounter for screening for other viral diseases: Secondary | ICD-10-CM | POA: Diagnosis not present

## 2018-04-17 DIAGNOSIS — Z01818 Encounter for other preprocedural examination: Secondary | ICD-10-CM | POA: Diagnosis not present

## 2018-04-17 DIAGNOSIS — Z114 Encounter for screening for human immunodeficiency virus [HIV]: Secondary | ICD-10-CM

## 2018-04-17 DIAGNOSIS — Z23 Encounter for immunization: Secondary | ICD-10-CM | POA: Diagnosis not present

## 2018-04-17 DIAGNOSIS — I10 Essential (primary) hypertension: Secondary | ICD-10-CM | POA: Diagnosis not present

## 2018-04-17 DIAGNOSIS — Z0000 Encounter for general adult medical examination without abnormal findings: Secondary | ICD-10-CM | POA: Diagnosis not present

## 2018-04-17 LAB — URINALYSIS, ROUTINE W REFLEX MICROSCOPIC
Bilirubin, UA: NEGATIVE
Glucose, UA: NEGATIVE
Leukocytes, UA: NEGATIVE
Nitrite, UA: NEGATIVE
Protein, UA: NEGATIVE
Specific Gravity, UA: 1.02 (ref 1.005–1.030)
Urobilinogen, Ur: 0.2 mg/dL (ref 0.2–1.0)
pH, UA: 6 (ref 5.0–7.5)

## 2018-04-17 LAB — MICROSCOPIC EXAMINATION: Bacteria, UA: NONE SEEN

## 2018-04-17 LAB — BAYER DCA HB A1C WAIVED: HB A1C (BAYER DCA - WAIVED): 5.2 % (ref <7.0)

## 2018-04-17 MED ORDER — HYDROXYZINE HCL 25 MG PO TABS: 25.0000 mg | ORAL_TABLET | Freq: Every evening | ORAL | 2 refills | Status: DC | PRN

## 2018-04-17 MED ORDER — AMITRIPTYLINE HCL 50 MG PO TABS: ORAL_TABLET | ORAL | 3 refills | Status: DC

## 2018-04-17 MED ORDER — AMLODIPINE-OLMESARTAN 5-40 MG PO TABS: 1.0000 | ORAL_TABLET | Freq: Every day | ORAL | 4 refills | Status: DC

## 2018-04-17 NOTE — Patient Instructions (Signed)

## 2018-04-17 NOTE — Progress Notes (Signed)
BP 136/86 (BP Location: Left Arm)   Pulse 97   Temp 98.1 F (36.7 C) (Oral)   Ht 5\' 8"  (1.727 m)   Wt 188 lb 1.6 oz (85.3 kg)   SpO2 98%   BMI 28.60 kg/m    Subjective:    Patient ID: Carolyn Wood, female    DOB: 1960-12-11, 57 y.o.   MRN: [id number]  HPI: Carolyn Wood is a 57 y.o. female  Chief Complaint  Patient presents with  . Annual Exam  . Surgical Clearance  Patient all in all doing well except for left hip getting ready to have hip replacement surgery. Has formed to fill out for preop evaluation. Patient has Ambien for sleep given by GYN.  Has 15 tablets for a 6 month supply which she stretches out to last Also uses amitriptyline as needed for sleep. Blood pressures been doing well with no complaints. Also takes hydroxyzine to help with sleep from time to time.  Relevant past medical, surgical, family and social history reviewed and updated as indicated. Interim medical history since our last visit reviewed. Allergies and medications reviewed and updated.  Review of Systems  Constitutional: Negative.   HENT: Negative.   Eyes: Negative.   Respiratory: Negative.   Cardiovascular: Negative.   Gastrointestinal: Negative.   Endocrine: Negative.   Genitourinary: Negative.   Musculoskeletal: Negative.   Skin: Negative.   Allergic/Immunologic: Negative.   Neurological: Negative.   Hematological: Negative.   Psychiatric/Behavioral: Negative.     Per HPI unless specifically indicated above     Objective:    BP 136/86 (BP Location: Left Arm)   Pulse 97   Temp 98.1 F (36.7 C) (Oral)   Ht 5\' 8"  (1.727 m)   Wt 188 lb 1.6 oz (85.3 kg)   SpO2 98%   BMI 28.60 kg/m   Wt Readings from Last 3 Encounters:  04/17/18 188 lb 1.6 oz (85.3 kg)  02/11/18 171 lb (77.6 kg)  01/03/18 181 lb (82.1 kg)    Physical Exam  Constitutional: She is oriented to person, place, and time. She appears well-developed and well-nourished.  HENT:  Head: Normocephalic  and atraumatic.  Right Ear: External ear normal.  Left Ear: External ear normal.  Nose: Nose normal.  Mouth/Throat: Oropharynx is clear and moist.  Eyes: Pupils are equal, round, and reactive to light. Conjunctivae and EOM are normal.  Neck: Normal range of motion. Neck supple. Carotid bruit is not present.  Cardiovascular: Normal rate, regular rhythm and normal heart sounds.  No murmur heard. Pulmonary/Chest: Effort normal and breath sounds normal.  Abdominal: Soft. Bowel sounds are normal. There is no hepatosplenomegaly.  Musculoskeletal: Normal range of motion.  Neurological: She is alert and oriented to person, place, and time.  Skin: No rash noted.  Psychiatric: She has a normal mood and affect. Her behavior is normal. Judgment and thought content normal.  Pelvic and breast exam done at GYN  EKG no acute changes normal sinus rhythm otherwise normal.    Assessment & Plan:   Problem List Items Addressed This Visit      Cardiovascular and Mediastinum   Essential hypertension    The current medical regimen is effective;  continue present plan and medications.       Relevant Medications   amLODipine-olmesartan (AZOR) 5-40 MG tablet     Other   Insomnia    Discussed insomnia GYN is writing Ambien.  Discussed insomnia care and treatment      Annual physical exam  Relevant Orders   EKG 12-Lead (Completed)   Comprehensive metabolic panel   Lipid panel   CBC with Differential/Platelet   TSH   Urinalysis, Routine w reflex microscopic   Bayer DCA Hb A1c Waived   Pre-op evaluation   Relevant Orders   EKG 12-Lead (Completed)   Bayer DCA Hb A1c Waived    Other Visit Diagnoses    Need for hepatitis C screening test    -  Primary   Relevant Orders   Hepatitis C Antibody   Encounter for screening for HIV       Relevant Orders   HIV Antibody (routine testing w rflx)   Need for influenza vaccination       Relevant Orders   Flu Vaccine QUAD 6+ mos PF IM (Fluarix Quad PF)  (Completed)    Patient cleared for hip surgery forms filled out for a [facility]. Low risk  Follow up plan: Return in about 6 months (around 10/17/2018) for BMP.

## 2018-04-17 NOTE — Assessment & Plan Note (Signed)
Discussed insomnia GYN is writing Ambien.  Discussed insomnia care and treatment

## 2018-04-17 NOTE — Addendum Note (Signed)
Addended by: Vonita Moss A on: 04/17/2018 12:34 PM   Modules accepted: Level of Service

## 2018-04-17 NOTE — Assessment & Plan Note (Signed)
The current medical regimen is effective;  continue present plan and medications.  

## 2018-04-17 NOTE — Progress Notes (Signed)
Depression screen [facility] 2/9 04/17/2018 02/11/2018 07/03/2017  Decreased Interest 1 2 0  Down, Depressed, Hopeless 1 2 0  PHQ - 2 Score 2 4 0  Altered sleeping 3 2 -  Tired, decreased energy 1 2 -  Change in appetite 0 2 -  Feeling bad or failure about yourself  0 1 -  Trouble concentrating 0 1 -  Moving slowly or fidgety/restless 0 0 -  Suicidal thoughts 0 0 -  PHQ-9 Score 6 12 -  Difficult doing work/chores - Somewhat difficult -

## 2018-04-18 ENCOUNTER — Telehealth: Payer: Self-pay | Admitting: Family Medicine

## 2018-04-18 DIAGNOSIS — R748 Abnormal levels of other serum enzymes: Secondary | ICD-10-CM

## 2018-04-18 LAB — HEPATITIS C ANTIBODY: Hep C Virus Ab: <0.1 s/co ratio (ref 0.0–0.9)

## 2018-04-18 LAB — HIV ANTIBODY (ROUTINE TESTING W REFLEX): HIV Screen 4th Generation wRfx: NONREACTIVE

## 2018-04-18 NOTE — Telephone Encounter (Signed)
Phone call Discussed with patient elevated liver enzymes patient denies drinking alcohol had one mixed drink last week is all.  Patient does not take Tylenol has taken and does take a lot of tramadol and has taken a lot of cyclobenzaprine in the past. Patient takes Celebrex. Patient will stop tramadol and cyclobenzaprine recheck CMP next week. In the meantime will not take Tylenol or alcohol.

## 2018-04-21 LAB — COMPREHENSIVE METABOLIC PANEL
ALT: 132 IU/L — ABNORMAL HIGH (ref 0–32)
AST: 90 IU/L — ABNORMAL HIGH (ref 0–40)
Albumin/Globulin Ratio: 1.8 (ref 1.2–2.2)
Albumin: 4.2 g/dL (ref 3.5–5.5)
Alkaline Phosphatase: 91 IU/L (ref 39–117)
BUN/Creatinine Ratio: 22 (ref 9–23)
BUN: 14 mg/dL (ref 6–24)
Bilirubin Total: 0.2 mg/dL (ref 0.0–1.2)
CO2: 25 mmol/L (ref 20–29)
Calcium: 9.1 mg/dL (ref 8.7–10.2)
Chloride: 101 mmol/L (ref 96–106)
Creatinine, Ser: 0.65 mg/dL (ref 0.57–1.00)
GFR calc Af Amer: 116 mL/min/{1.73_m2} (ref >59)
GFR calc non Af Amer: 100 mL/min/{1.73_m2} (ref >59)
Globulin, Total: 2.3 g/dL (ref 1.5–4.5)
Glucose: 92 mg/dL (ref 65–99)
Potassium: 4.3 mmol/L (ref 3.5–5.2)
Sodium: 138 mmol/L (ref 134–144)
Total Protein: 6.5 g/dL (ref 6.0–8.5)

## 2018-04-21 LAB — CBC WITH DIFFERENTIAL/PLATELET
Basophils Absolute: 0.1 10*3/uL (ref 0.0–0.2)
Basos: 1 %
EOS (ABSOLUTE): 0.1 10*3/uL (ref 0.0–0.4)
Eos: 2 %
Hematocrit: 37.4 % (ref 34.0–46.6)
Hemoglobin: 12.4 g/dL (ref 11.1–15.9)
Immature Grans (Abs): 0 10*3/uL (ref 0.0–0.1)
Immature Granulocytes: 0 %
Lymphocytes Absolute: 3 10*3/uL (ref 0.7–3.1)
Lymphs: 44 %
MCH: 30.3 pg (ref 26.6–33.0)
MCHC: 33.2 g/dL (ref 31.5–35.7)
MCV: 91 fL (ref 79–97)
Monocytes Absolute: 0.4 10*3/uL (ref 0.1–0.9)
Monocytes: 6 %
Neutrophils Absolute: 3.3 10*3/uL (ref 1.4–7.0)
Neutrophils: 47 %
Platelets: 340 10*3/uL (ref 150–450)
RBC: 4.09 x10E6/uL (ref 3.77–5.28)
RDW: 13.4 % (ref 12.3–15.4)
WBC: 6.9 10*3/uL (ref 3.4–10.8)

## 2018-04-21 LAB — LIPID PANEL
Chol/HDL Ratio: 3.5 ratio (ref 0.0–4.4)
Cholesterol, Total: 258 mg/dL — ABNORMAL HIGH (ref 100–199)
HDL: 74 mg/dL (ref >39)
LDL Calculated: 161 mg/dL — ABNORMAL HIGH (ref 0–99)
Triglycerides: 117 mg/dL (ref 0–149)
VLDL Cholesterol Cal: 23 mg/dL (ref 5–40)

## 2018-04-21 LAB — TSH: TSH: 3.45 u[IU]/mL (ref 0.450–4.500)

## 2018-04-25 ENCOUNTER — Other Ambulatory Visit: Payer: BC Managed Care – PPO

## 2018-04-25 DIAGNOSIS — R748 Abnormal levels of other serum enzymes: Secondary | ICD-10-CM

## 2018-04-26 ENCOUNTER — Encounter: Payer: Self-pay | Admitting: Family Medicine

## 2018-04-26 ENCOUNTER — Other Ambulatory Visit: Payer: Self-pay | Admitting: Family Medicine

## 2018-04-26 DIAGNOSIS — R748 Abnormal levels of other serum enzymes: Secondary | ICD-10-CM

## 2018-04-26 LAB — COMPREHENSIVE METABOLIC PANEL
ALT: 144 IU/L — ABNORMAL HIGH (ref 0–32)
AST: 112 IU/L — ABNORMAL HIGH (ref 0–40)
Albumin/Globulin Ratio: 1.8 (ref 1.2–2.2)
Albumin: 4.3 g/dL (ref 3.5–5.5)
Alkaline Phosphatase: 118 IU/L — ABNORMAL HIGH (ref 39–117)
BUN/Creatinine Ratio: 15 (ref 9–23)
BUN: 11 mg/dL (ref 6–24)
Bilirubin Total: 0.2 mg/dL (ref 0.0–1.2)
CO2: 24 mmol/L (ref 20–29)
Calcium: 9.3 mg/dL (ref 8.7–10.2)
Chloride: 100 mmol/L (ref 96–106)
Creatinine, Ser: 0.73 mg/dL (ref 0.57–1.00)
GFR calc Af Amer: 107 mL/min/{1.73_m2} (ref >59)
GFR calc non Af Amer: 93 mL/min/{1.73_m2} (ref >59)
Globulin, Total: 2.4 g/dL (ref 1.5–4.5)
Glucose: 91 mg/dL (ref 65–99)
Potassium: 4.3 mmol/L (ref 3.5–5.2)
Sodium: 141 mmol/L (ref 134–144)
Total Protein: 6.7 g/dL (ref 6.0–8.5)

## 2018-04-29 ENCOUNTER — Ambulatory Visit: Payer: BC Managed Care – PPO | Admitting: Gastroenterology

## 2018-04-29 ENCOUNTER — Other Ambulatory Visit: Payer: Self-pay

## 2018-04-29 ENCOUNTER — Encounter: Payer: Self-pay | Admitting: Gastroenterology

## 2018-04-29 VITALS — BP 143/80 | HR 94 | Ht 68.0 in | Wt 170.0 lb

## 2018-04-29 DIAGNOSIS — R748 Abnormal levels of other serum enzymes: Secondary | ICD-10-CM | POA: Diagnosis not present

## 2018-04-29 NOTE — Patient Instructions (Signed)
You are scheduled for a RUQ abdominal US at [facility] on Friday, Nov 8th at 9:30am. Please arrive at 9:15am and check in at the medical mall registration desk. You cannot have anything to eat or drink after midnight on Thursday night.   If you need to reschedule this appointment for any reason, please contact central scheduling at [phone].

## 2018-04-29 NOTE — Progress Notes (Signed)
Gastroenterology Consultation  Referring Provider:     Steele Sizer, MD Primary Care Physician:  Steele Sizer, MD Primary Gastroenterologist:  Dr. Servando Snare     Reason for Consultation:     Abnormal liver enzymes        HPI:   Carolyn Wood is a 57 y.o. y/o female referred for consultation & management of abnormal liver enzymes by Dr. Dossie Arbour, Redge Gainer, MD.  This patient comes here today for a consultation for abnormal liver enzymes.  The patient appears to have been contacted by Dr. Earnest Conroy office back in 2015 for a screening colonoscopy.  The patient was recently found to have abnormal liver enzymes after previously having normal liver enzymes.  Her most recent labs showed:  Component     Latest Ref Rng & Units 12/06/2016 01/23/2017 04/17/2018 04/25/2018  Alkaline Phosphatase     39 - 117 IU/L 46  91 118 (H)  AST     0 - 40 IU/L 28  90 (H) 112 (H)  ALT     0 - 32 IU/L 18  132 (H) 144 (H)    The patient was noted to have a hepatitis C antibody that was negative.  The patient also had a normal CBC and normal TSH.  It does not appear that the patient has had any imaging of her liver. The patient reports that she has been taking tramadol for her painful hip.  The patient is due for surgery in December for her second hip replacement.  The patient is quite concerned about her abnormal liver enzymes and has done reading on the Internet.  This has given her a great deal of concern and she is easily crying throughout the encounter today.  She also reports that she takes a large amount of vitamins every morning.  She has stopped alcohol intake.  She denies drinking heavily in the past.  Past Medical History:  Diagnosis Date  . Anxiety   . Arthritis   . Complication of anesthesia    combative when waking up  . History of Papanicolaou smear of cervix 11/05/2013   RNIL/NEG  . Seasonal allergies     Past Surgical History:  Procedure Laterality Date  . ABDOMINAL HYSTERECTOMY      . AUGMENTATION MAMMAPLASTY Bilateral 2015   silicone  . BUNIONECTOMY Bilateral   . CESAREAN SECTION    . chin implant    . cyst removed     tail bone  . JOINT REPLACEMENT Right    hip  . KNEE ARTHROSCOPY Right 1989   "dont know what they did"  . LIPOSUCTION     on neck and chin  . NASAL SEPTUM SURGERY Right 07/2016  . TONSILLECTOMY    . TOTAL HIP ARTHROPLASTY Right 10/29/2012   Procedure: RIGHT TOTAL HIP ARTHROPLASTY ANTERIOR APPROACH;  Surgeon: Shelda Pal, MD;  Location: [facility];  Service: Orthopedics;  Laterality: Right;    Prior to Admission medications   Medication Sig Start Date End Date Taking? Authorizing Provider  amitriptyline (ELAVIL) 50 MG tablet TAKE 1/2 TO 1 (ONE-HALF TO ONE) TABLET BY MOUTH ONCE DAILY AT BEDTIME 04/17/18   Crissman, Redge Gainer, MD  amLODipine-olmesartan (AZOR) 5-40 MG tablet Take 1 tablet by mouth daily. 04/17/18   Steele Sizer, MD  Biotin 1000 MCG tablet Take 1,000 mcg by mouth 3 (three) times daily.    [provider]  celecoxib (CELEBREX) 200 MG capsule Take 1 capsule (200 mg total) by mouth  every 12 (twelve) hours. Patient taking differently: Take 200 mg by mouth at bedtime.  10/30/12   Lanney Gins, PA-C  cyclobenzaprine (FLEXERIL) 10 MG tablet Take 0.5 tablets (5 mg total) by mouth 3 (three) times daily as needed for muscle spasms. 09/10/17   Menshew, Charlesetta Ivory, PA-C  FIBER PO Take by mouth.    [provider]  Fish Oil-Cholecalciferol (FISH OIL + D3 PO) Take by mouth.    [provider]  Glucosamine-Chondroitin (GLUCOSAMINE CHONDR COMPLEX PO) Take by mouth.    [provider]  hydrOXYzine (ATARAX/VISTARIL) 25 MG tablet Take 1 tablet (25 mg total) by mouth at bedtime as needed. 04/17/18   Steele Sizer, MD  Linoleic Acid-Sunflower Oil (CLA PO) Take by mouth.    [provider]  traMADol (ULTRAM) 50 MG tablet Take 50 mg by mouth every 6 (six) hours as needed.    [provider]   TURMERIC PO Take by mouth.    [provider]  valACYclovir (VALTREX) 500 MG tablet Take 1 tablet (500 mg total) by mouth 2 (two) times daily. For flares 02/11/18   Particia Nearing, PA-C  vitamin C (ASCORBIC ACID) 500 MG tablet Take by mouth.    [provider]  vitamin E 400 UNIT capsule Take by mouth.    [provider]  zolpidem (AMBIEN) 5 MG tablet Take 1 tablet (5 mg total) by mouth at bedtime as needed for sleep. 01/03/18   Nadara Mustard, MD    Family History  Problem Relation Age of Onset  . Cancer Mother        kidney  . Diabetes Mother   . Hypertension Mother   . Breast cancer Neg Hx   . Ovarian cancer Neg Hx      Social History   Tobacco Use  . Smoking status: Former Smoker    Packs/day: 0.50    Years: 20.00    Pack years: 10.00    Types: Cigarettes, Cigars  . Smokeless tobacco: Never Used  Substance Use Topics  . Alcohol use: Yes  . Drug use: No    Allergies as of 04/29/2018  . (No Known Allergies)    Review of Systems:    All systems reviewed and negative except where noted in HPI.   Physical Exam:  BP (!) 143/80   Pulse 94   Ht 5\' 8"  (1.727 m)   Wt 170 lb (77.1 kg)   BMI 25.85 kg/m  No LMP recorded. Patient has had a hysterectomy. General:   Alert,  Well-developed, well-nourished, pleasant and cooperative in NAD Head:  Normocephalic and atraumatic. Eyes:  Sclera clear, no icterus.   Conjunctiva pink. Ears:  Normal auditory acuity. Nose:  No deformity, discharge, or lesions. Mouth:  No deformity or lesions,oropharynx pink & moist. Neck:  Supple; no masses or thyromegaly. Lungs:  Respirations even and unlabored.  Clear throughout to auscultation.   No wheezes, crackles, or rhonchi. No acute distress. Heart:  Regular rate and rhythm; no murmurs, clicks, rubs, or gallops. Abdomen:  Normal bowel sounds.  No bruits.  Soft, non-tender and non-distended without masses, hepatosplenomegaly or hernias noted.  No guarding or  rebound tenderness.  Negative Carnett sign.   Rectal:  Deferred.  Msk:  Symmetrical without gross deformities.  Good, equal movement & strength bilaterally. Pulses:  Normal pulses noted. Extremities:  No clubbing or edema.  No cyanosis. Neurologic:  Alert and oriented x3;  grossly normal neurologically. Skin:  Intact without significant  lesions or rashes.  No jaundice. Lymph Nodes:  No significant cervical adenopathy. Psych:  Alert and cooperative. Normal mood and affect.  Imaging Studies: No results found.  Assessment and Plan:   Carolyn Wood is a 57 y.o. y/o female with abnormal liver enzymes that appear to be increasing.  The 2 labs were both from October of this year.  The patient will be set up for blood work to look for any other cause of abnormal liver enzymes.  She will also have an ultrasound to rule out any biliary disease, fatty liver or other lesions of the liver that could explain her abnormal liver enzymes.  The patient has also been told to stop all of her dietary supplements and try and avoid taking her tramadol or any other medications while we try to figure out what is causing her liver enzymes to be abnormal.  The patient has been explained the plan and agrees with it.  Midge Minium, MD. Clementeen Graham    Note: This dictation was prepared with Dragon dictation along with smaller phrase technology. Any transcriptional errors that result from this process are unintentional.

## 2018-04-30 LAB — IRON AND TIBC
Iron Saturation: 21 % (ref 15–55)
Iron: 69 ug/dL (ref 27–159)
Total Iron Binding Capacity: 324 ug/dL (ref 250–450)
UIBC: 255 ug/dL (ref 131–425)

## 2018-04-30 LAB — CERULOPLASMIN: Ceruloplasmin: 33.1 mg/dL (ref 19.0–39.0)

## 2018-04-30 LAB — FERRITIN: Ferritin: 235 ng/mL — ABNORMAL HIGH (ref 15–150)

## 2018-04-30 LAB — ANA: Anti Nuclear Antibody(ANA): NEGATIVE

## 2018-04-30 LAB — HEPATITIS B SURFACE ANTIBODY,QUALITATIVE: Hep B Surface Ab, Qual: NONREACTIVE

## 2018-04-30 LAB — HEPATITIS A ANTIBODY, TOTAL: Hep A Total Ab: NEGATIVE

## 2018-04-30 LAB — ALPHA-1-ANTITRYPSIN: A-1 Antitrypsin: 157 mg/dL (ref 101–187)

## 2018-04-30 LAB — HEPATIC FUNCTION PANEL
ALT: 143 IU/L — ABNORMAL HIGH (ref 0–32)
AST: 89 IU/L — ABNORMAL HIGH (ref 0–40)
Albumin: 4.9 g/dL (ref 3.5–5.5)
Alkaline Phosphatase: 147 IU/L — ABNORMAL HIGH (ref 39–117)
Bilirubin Total: 0.3 mg/dL (ref 0.0–1.2)
Bilirubin, Direct: 0.1 mg/dL (ref 0.00–0.40)
Total Protein: 7.5 g/dL (ref 6.0–8.5)

## 2018-04-30 LAB — HEPATITIS B SURFACE ANTIGEN: Hepatitis B Surface Ag: NEGATIVE

## 2018-04-30 LAB — MITOCHONDRIAL ANTIBODIES: Mitochondrial Ab: <20 Units (ref 0.0–20.0)

## 2018-04-30 LAB — ANTI-SMOOTH MUSCLE ANTIBODY, IGG: Smooth Muscle Ab: 20 Units — ABNORMAL HIGH (ref 0–19)

## 2018-05-03 ENCOUNTER — Ambulatory Visit
Admission: RE | Admit: 2018-05-03 | Discharge: 2018-05-03 | Disposition: A | Discharge status: Home / Self Care | Payer: BC Managed Care – PPO | Source: Ambulatory Visit | Attending: Gastroenterology | Admitting: Gastroenterology

## 2018-05-03 DIAGNOSIS — R748 Abnormal levels of other serum enzymes: Secondary | ICD-10-CM

## 2018-05-09 ENCOUNTER — Other Ambulatory Visit: Payer: Self-pay

## 2018-05-09 ENCOUNTER — Telehealth: Payer: Self-pay

## 2018-05-09 DIAGNOSIS — R748 Abnormal levels of other serum enzymes: Secondary | ICD-10-CM

## 2018-05-09 NOTE — Telephone Encounter (Signed)
Pt notified of US and lab results. Pt will go in a couple of weeks for repeat LFT's. Order placed.

## 2018-05-09 NOTE — Telephone Encounter (Signed)
-----   Message from Midge Miniumarren Wohl, MD sent at 05/09/2018  2:20 PM EST ----- Let the patient know that the lfts are about the same and the U/S did not show any fatty liver or masses. Repeat LFT's in 2 weeks and see if it is going down. If not then she may need a liver biopsy ----- Message ----- From: Rayann HemanFeldpausch, Linlee Cromie, CMA Sent: 05/09/2018   1:31 PM EST To: Midge Miniumarren Wohl, MD  Please review recent lab results and advise. We are having an issue with [facility] resulting to our office. Somehow the link got disconnected between us and [facility].  I may have to go back a few weeks to each day to see who we have missed.

## 2018-05-15 NOTE — H&P (Signed)
TOTAL HIP ADMISSION H&P  Patient is admitted for left total hip arthroplasty, anterior approach.  Subjective:  Chief Complaint:    Left hip primary OA / pain  HPI: Carolyn Wood, 57 y.o. female, has a history of pain and functional disability in the left hip(s) due to arthritis and patient has failed non-surgical conservative treatments for greater than 12 weeks to include NSAID's and/or analgesics, corticosteriod injections, use of assistive devices and activity modification.  Onset of symptoms was gradual starting 3 years ago with gradually worsening course since that time.The patient noted prior procedures of the hip to include arthroplasty on the right hip per Dr. Charlann Boxer in 2014.  Patient currently rates pain in the left hip at 9 out of 10 with activity. Patient has night pain, worsening of pain with activity and weight bearing, trendelenberg gait, pain that interfers with activities of daily living and pain with passive range of motion. Patient has evidence of periarticular osteophytes and joint space narrowing by imaging studies. This condition presents safety issues increasing the risk of falls.  There is no current active infection.  Risks, benefits and expectations were discussed with the patient.  Risks including but not limited to the risk of anesthesia, blood clots, nerve damage, blood vessel damage, failure of the prosthesis, infection and up to and including death.  Patient understand the risks, benefits and expectations and wishes to proceed with surgery.   PCP: Steele Sizer, MD  D/C Plans:       Home  Post-op Meds:       No Rx given  Tranexamic Acid:      To be given - IV   Decadron:      Is to be given  FYI:      ASA  Oxycodone (avoid APAP due to increased liver enzymes)  DME:   Pt already has equipment  PT:    No PT    Patient Active Problem List   Diagnosis Date Noted  . Pre-op evaluation 04/17/2018  . Osteoarthritis of hip 10/30/2017  . Trochanteric  bursitis of left hip 10/30/2017  . Localized, primary osteoarthritis of shoulder region 09/19/2017  . Shoulder strain 09/19/2017  . Strain of trapezius muscle 09/19/2017  . Hormone replacement therapy (HRT) 01/23/2017  . Annual physical exam 01/08/2017  . Deviated nasal septum 03/20/2016  . Nasal obstruction 03/20/2016  . Depression 11/17/2015  . Essential hypertension 11/17/2015  . Tinnitus of both ears 10/05/2015  . Insomnia 10/05/2015  . Anxiety 03/22/2015  . 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 Anxiety   . Arthritis   . Complication of anesthesia    combative when waking up  . History of Papanicolaou smear of cervix 11/05/2013   RNIL/NEG  . Seasonal allergies     Past Surgical History:  Procedure Laterality Date  . ABDOMINAL HYSTERECTOMY    . AUGMENTATION MAMMAPLASTY Bilateral 2015   silicone  . BUNIONECTOMY Bilateral   . CESAREAN SECTION    . chin implant    . cyst removed     tail bone  . JOINT REPLACEMENT Right    hip  . KNEE ARTHROSCOPY Right 1989   "dont know what they did"  . LIPOSUCTION     on neck and chin  . NASAL SEPTUM SURGERY Right 07/2016  . TONSILLECTOMY    . TOTAL HIP ARTHROPLASTY Right 10/29/2012   Procedure: RIGHT TOTAL HIP ARTHROPLASTY ANTERIOR APPROACH;  Surgeon: Shelda Pal, MD;  Location: [facility];  Service: Orthopedics;  Laterality: Right;    No current facility-administered medications for this encounter.    Current Outpatient Medications  Medication Sig Dispense Refill Last Dose  . amitriptyline (ELAVIL) 50 MG tablet TAKE 1/2 TO 1 (ONE-HALF TO ONE) TABLET BY MOUTH ONCE DAILY AT BEDTIME 90 tablet 3   . amLODipine-olmesartan (AZOR) 5-40 MG tablet Take 1 tablet by mouth daily. 90 tablet 4   . Biotin 1000 MCG tablet Take 1,000 mcg by mouth 3 (three) times daily.   Taking  . celecoxib (CELEBREX) 200 MG capsule Take 1 capsule (200 mg total) by mouth every 12 (twelve) hours. (Patient taking differently: Take  200 mg by mouth at bedtime. )   Taking  . cyclobenzaprine (FLEXERIL) 10 MG tablet Take 0.5 tablets (5 mg total) by mouth 3 (three) times daily as needed for muscle spasms. 15 tablet 0 Taking  . FIBER PO Take by mouth.   Taking  . Fish Oil-Cholecalciferol (FISH OIL + D3 PO) Take by mouth.   Taking  . Glucosamine-Chondroitin (GLUCOSAMINE CHONDR COMPLEX PO) Take by mouth.   Taking  . hydrOXYzine (ATARAX/VISTARIL) 25 MG tablet Take 1 tablet (25 mg total) by mouth at bedtime as needed. 90 tablet 2   . Linoleic Acid-Sunflower Oil (CLA PO) Take by mouth.   Taking  . meloxicam (MOBIC) 15 MG tablet meloxicam 15 mg tablet  Take 1 tablet(s) every day by oral route.     . traMADol (ULTRAM) 50 MG tablet Take 50 mg by mouth every 6 (six) hours as needed.   Taking  . TURMERIC PO Take by mouth.     . valACYclovir (VALTREX) 500 MG tablet Take 1 tablet (500 mg total) by mouth 2 (two) times daily. For flares 60 tablet 6 Taking  . vitamin C (ASCORBIC ACID) 500 MG tablet Take by mouth.   Taking  . vitamin E 400 UNIT capsule Take by mouth.   Taking  . zolpidem (AMBIEN) 5 MG tablet Take 1 tablet (5 mg total) by mouth at bedtime as needed for sleep. 15 tablet 1 Taking   No Known Allergies   Social History   Tobacco Use  . Smoking status: Former Smoker    Packs/day: 0.50    Years: 20.00    Pack years: 10.00    Types: Cigarettes, Cigars  . Smokeless tobacco: Never Used  Substance Use Topics  . Alcohol use: Yes    Family History  Problem Relation Age of Onset  . Cancer Mother        kidney  . Diabetes Mother   . Hypertension Mother   . Breast cancer Neg Hx   . Ovarian cancer Neg Hx      Review of Systems  Constitutional: Negative.   HENT: Positive for tinnitus.   Eyes: Negative.   Respiratory: Negative.   Cardiovascular: Negative.   Gastrointestinal: Negative.   Genitourinary: Negative.   Musculoskeletal: Positive for joint pain.  Skin: Negative.   Neurological: Negative.    Endo/Heme/Allergies: Negative.   Psychiatric/Behavioral: Positive for depression. The patient is nervous/anxious and has insomnia.     Objective:  Physical Exam  Constitutional: She is oriented to person, place, and time. She appears well-developed.  HENT:  Head: Normocephalic.  Eyes: Pupils are equal, round, and reactive to light.  Neck: Neck supple. No JVD present. No tracheal deviation present. No thyromegaly present.  Cardiovascular: Normal rate, regular rhythm and intact distal pulses.  Respiratory: Effort normal and breath sounds normal. No  respiratory distress. She has no wheezes.  GI: Soft. There is no tenderness. There is no guarding.  Musculoskeletal:       Left hip: She exhibits decreased range of motion, decreased strength, tenderness and bony tenderness. She exhibits no swelling, no deformity and no laceration.  Lymphadenopathy:    She has no cervical adenopathy.  Neurological: She is alert and oriented to person, place, and time.  Skin: Skin is warm and dry.  Psychiatric: She has a normal mood and affect.      Labs:  Estimated body mass index is 25.85 kg/m as calculated from the following:   Height as of 04/29/18: 5\' 8"  (1.727 m).   Weight as of 04/29/18: 77.1 kg.   Imaging Review Plain radiographs demonstrate severe degenerative joint disease of the left hip. The bone quality appears to be good for age and reported activity level.    Preoperative templating of the joint replacement has been completed, documented, and submitted to the Operating Room personnel in order to optimize intra-operative equipment management.     Assessment/Plan:  End stage arthritis, left hip  The patient history, physical examination, clinical judgement of the provider and imaging studies are consistent with end stage degenerative joint disease of the left hip and total hip arthroplasty is deemed medically necessary. The treatment options including medical management, injection  therapy, arthroscopy and arthroplasty were discussed at length. The risks and benefits of total hip arthroplasty were presented and reviewed. The risks due to aseptic loosening, infection, stiffness, dislocation/subluxation,  thromboembolic complications and other imponderables were discussed.  The patient acknowledged the explanation, agreed to proceed with the plan and consent was signed. Patient is being admitted for inpatient treatment for surgery, pain control, PT, OT, prophylactic antibiotics, VTE prophylaxis, progressive ambulation and ADL's and discharge planning.The patient is planning to be discharged home.      Anastasio Auerbach Elia Keenum   PA-C  05/15/2018, 11:27 AM

## 2018-05-28 NOTE — Progress Notes (Signed)
Medical clearance Dr Vonita MossMark Crissman epic   ekg 04-17-18 epic   cxr 09-10-17 epic

## 2018-05-28 NOTE — Patient Instructions (Addendum)
Carolyn Wood  05/28/2018   Your procedure is scheduled on: 06-04-18   Report to [facility] Main  Entrance    Report to admitting at 9:00AM    Call this number if you have problems the morng of surgery [phone]     Remember: Do not eat food or drink liquids :After Midnight. BRUSH YOUR TEETH MORNING OF SURGERY AND RINSE YOUR MOUTH OUT, NO CHEWING GUM CANDY OR MINTS.     Take these medicines the morning of surgery with A SIP OF WATER: TRAMADOL IF NEEDED                                 You may not have any metal on your body including hair pins and              piercings  Do not wear jewelry, make-up, lotions, powders or perfumes, deodorant             Do not wear nail polish.  Do not shave  48 hours prior to surgery.         Do not bring valuables to the hospital. [facility] IS NOT             RESPONSIBLE   FOR VALUABLES.  Contacts, dentures or bridgework may not be worn into surgery.  Leave suitcase in the car. After surgery it may be brought to your room.                 Please read over the following fact sheets you were given: _____________________________________________________________________             [facility] - Preparing for Surgery Before surgery, you can play an important role.  Because skin is not sterile, your skin needs to be as free of germs as possible.  You can reduce the number of germs on your skin by washing with CHG (chlorahexidine gluconate) soap before surgery.  CHG is an antiseptic cleaner which kills germs and bonds with the skin to continue killing germs even after washing. Please DO NOT use if you have an allergy to CHG or antibacterial soaps.  If your skin becomes reddened/irritated stop using the CHG and inform your nurse when you arrive at Short Stay. Do not shave (including legs and underarms) for at least 48 hours prior to the first CHG shower.  You may shave your face/neck. Please follow these  instructions carefully:  1.  Shower with CHG Soap the night before surgery and the  morning of Surgery.  2.  If you choose to wash your hair, wash your hair first as usual with your  normal  shampoo.  3.  After you shampoo, rinse your hair and body thoroughly to remove the  shampoo.                           4.  Use CHG as you would any other liquid soap.  You can apply chg directly  to the skin and wash                       Gently with a scrungie or clean washcloth.  5.  Apply the CHG Soap to your body ONLY FROM THE NECK DOWN.   Do not use on face/ open  Wound or open sores. Avoid contact with eyes, ears mouth and genitals (private parts).                       Wash face,  Genitals (private parts) with your normal soap.             6.  Wash thoroughly, paying special attention to the area where your surgery  will be performed.  7.  Thoroughly rinse your body with warm water from the neck down.  8.  DO NOT shower/wash with your normal soap after using and rinsing off  the CHG Soap.                9.  Pat yourself dry with a clean towel.            10.  Wear clean pajamas.            11.  Place clean sheets on your bed the night of your first shower and do not  sleep with pets. Day of Surgery : Do not apply any lotions/deodorants the morning of surgery.  Please wear clean clothes to the hospital/surgery center.  FAILURE TO FOLLOW THESE INSTRUCTIONS MAY RESULT IN THE CANCELLATION OF YOUR SURGERY PATIENT SIGNATURE_________________________________  NURSE SIGNATURE__________________________________  ________________________________________________________________________   Adam Phenix  An incentive spirometer is a tool that can help keep your lungs clear and active. This tool measures how well you are filling your lungs with each breath. Taking long deep breaths may help reverse or decrease the chance of developing breathing (pulmonary) problems (especially  infection) following:  A long period of time when you are unable to move or be active. BEFORE THE PROCEDURE   If the spirometer includes an indicator to show your best effort, your nurse or respiratory therapist will set it to a desired goal.  If possible, sit up straight or lean slightly forward. Try not to slouch.  Hold the incentive spirometer in an upright position. INSTRUCTIONS FOR USE  1. Sit on the edge of your bed if possible, or sit up as far as you can in bed or on a chair. 2. Hold the incentive spirometer in an upright position. 3. Breathe out normally. 4. Place the mouthpiece in your mouth and seal your lips tightly around it. 5. Breathe in slowly and as deeply as possible, raising the piston or the ball toward the top of the column. 6. Hold your breath for 3-5 seconds or for as long as possible. Allow the piston or ball to fall to the bottom of the column. 7. Remove the mouthpiece from your mouth and breathe out normally. 8. Rest for a few seconds and repeat Steps 1 through 7 at least 10 times every 1-2 hours when you are awake. Take your time and take a few normal breaths between deep breaths. 9. The spirometer may include an indicator to show your best effort. Use the indicator as a goal to work toward during each repetition. 10. After each set of 10 deep breaths, practice coughing to be sure your lungs are clear. If you have an incision (the cut made at the time of surgery), support your incision when coughing by placing a pillow or rolled up towels firmly against it. Once you are able to get out of bed, walk around indoors and cough well. You may stop using the incentive spirometer when instructed by your caregiver.  RISKS AND COMPLICATIONS  Take your time so you do not get  dizzy or light-headed.  If you are in pain, you may need to take or ask for pain medication before doing incentive spirometry. It is harder to take a deep breath if you are having pain. AFTER  USE  Rest and breathe slowly and easily.  It can be helpful to keep track of a log of your progress. Your caregiver can provide you with a simple table to help with this. If you are using the spirometer at home, follow these instructions: [place] IF:   You are having difficultly using the spirometer.  You have trouble using the spirometer as often as instructed.  Your pain medication is not giving enough relief while using the spirometer.  You develop fever of 100.5 F (38.1 C) or higher. SEEK IMMEDIATE MEDICAL CARE IF:   You cough up bloody sputum that had not been present before.  You develop fever of 102 F (38.9 C) or greater.  You develop worsening pain at or near the incision site. MAKE SURE YOU:   Understand these instructions.  Will watch your condition.  Will get help right away if you are not doing well or get worse. Document Released: 10/23/2006 Document Revised: 09/04/2011 Document Reviewed: 12/24/2006 ExitCare Patient Information 2014 Exit[employer], [place].   ________________________________________________________________________  WHAT IS A BLOOD TRANSFUSION? Blood Transfusion Information  A transfusion is the replacement of blood or some of its parts. Blood is made up of multiple cells which provide different functions.  Red blood cells carry oxygen and are used for blood loss replacement.  White blood cells fight against infection.  Platelets control bleeding.  Plasma helps clot blood.  Other blood products are available for specialized needs, such as hemophilia or other clotting disorders. BEFORE THE TRANSFUSION  Who gives blood for transfusions?   Healthy volunteers who are fully evaluated to make sure their blood is safe. This is blood bank blood. Transfusion therapy is the safest it has ever been in the practice of medicine. Before blood is taken from a donor, a complete history is taken to make sure that person has no history of diseases  nor engages in risky social behavior (examples are intravenous drug use or sexual activity with multiple partners). The donor's travel history is screened to minimize risk of transmitting infections, such as malaria. The donated blood is tested for signs of infectious diseases, such as HIV and hepatitis. The blood is then tested to be sure it is compatible with you in order to minimize the chance of a transfusion reaction. If you or a relative donates blood, this is often done in anticipation of surgery and is not appropriate for emergency situations. It takes many days to process the donated blood. RISKS AND COMPLICATIONS Although transfusion therapy is very safe and saves many lives, the main dangers of transfusion include:   Getting an infectious disease.  Developing a transfusion reaction. This is an allergic reaction to something in the blood you were given. Every precaution is taken to prevent this. The decision to have a blood transfusion has been considered carefully by your caregiver before blood is given. Blood is not given unless the benefits outweigh the risks. AFTER THE TRANSFUSION  Right after receiving a blood transfusion, you will usually feel much better and more energetic. This is especially true if your red blood cells have gotten low (anemic). The transfusion raises the level of the red blood cells which carry oxygen, and this usually causes an energy increase.  The nurse administering the transfusion will  monitor you carefully for complications. HOME CARE INSTRUCTIONS  No special instructions are needed after a transfusion. You may find your energy is better. Speak with your caregiver about any limitations on activity for underlying diseases you may have. SEEK MEDICAL CARE IF:   Your condition is not improving after your transfusion.  You develop redness or irritation at the intravenous (IV) site. SEEK IMMEDIATE MEDICAL CARE IF:  Any of the following symptoms occur over the  next 12 hours:  Shaking chills.  You have a temperature by mouth above 102 F (38.9 C), not controlled by medicine.  Chest, back, or muscle pain.  People around you feel you are not acting correctly or are confused.  Shortness of breath or difficulty breathing.  Dizziness and fainting.  You get a rash or develop hives.  You have a decrease in urine output.  Your urine turns a dark color or changes to pink, red, or brown. Any of the following symptoms occur over the next 10 days:  You have a temperature by mouth above 102 F (38.9 C), not controlled by medicine.  Shortness of breath.  Weakness after normal activity.  The white part of the eye turns yellow (jaundice).  You have a decrease in the amount of urine or are urinating less often.  Your urine turns a dark color or changes to pink, red, or brown. Document Released: 06/09/2000 Document Revised: 09/04/2011 Document Reviewed: 01/27/2008 [facility] Patient Information 2014 [place], [place].  _______________________________________________________________________

## 2018-05-29 LAB — HEPATIC FUNCTION PANEL
ALT: 57 IU/L — ABNORMAL HIGH (ref 0–32)
AST: 70 IU/L — ABNORMAL HIGH (ref 0–40)
Albumin: 4.3 g/dL (ref 3.5–5.5)
Alkaline Phosphatase: 100 IU/L (ref 39–117)
Bilirubin Total: <0.2 mg/dL (ref 0.0–1.2)
Bilirubin, Direct: 0.07 mg/dL (ref 0.00–0.40)
Total Protein: 7.1 g/dL (ref 6.0–8.5)

## 2018-05-30 ENCOUNTER — Encounter (HOSPITAL_COMMUNITY): Payer: Self-pay

## 2018-05-30 ENCOUNTER — Encounter (HOSPITAL_COMMUNITY)
Admission: RE | Admit: 2018-05-30 | Discharge: 2018-05-30 | Disposition: A | Discharge status: Home / Self Care | Payer: BC Managed Care – PPO | Source: Ambulatory Visit | Attending: Orthopedic Surgery | Admitting: Orthopedic Surgery

## 2018-05-30 ENCOUNTER — Other Ambulatory Visit: Payer: Self-pay

## 2018-05-30 DIAGNOSIS — M1612 Unilateral primary osteoarthritis, left hip: Secondary | ICD-10-CM | POA: Diagnosis not present

## 2018-05-30 DIAGNOSIS — Z01812 Encounter for preprocedural laboratory examination: Secondary | ICD-10-CM | POA: Insufficient documentation

## 2018-05-30 DIAGNOSIS — M25552 Pain in left hip: Secondary | ICD-10-CM | POA: Insufficient documentation

## 2018-05-30 HISTORY — DX: Abnormal levels of other serum enzymes: R74.8

## 2018-05-30 HISTORY — DX: Essential (primary) hypertension: I10

## 2018-05-30 LAB — CBC
HCT: 39.2 % (ref 36.0–46.0)
Hemoglobin: 12.6 g/dL (ref 12.0–15.0)
MCH: 30.6 pg (ref 26.0–34.0)
MCHC: 32.1 g/dL (ref 30.0–36.0)
MCV: 95.1 fL (ref 80.0–100.0)
Platelets: 330 10*3/uL (ref 150–400)
RBC: 4.12 MIL/uL (ref 3.87–5.11)
RDW: 12.8 % (ref 11.5–15.5)
WBC: 9.4 10*3/uL (ref 4.0–10.5)
nRBC: 0 % (ref 0.0–0.2)

## 2018-05-30 LAB — SURGICAL PCR SCREEN
MRSA, PCR: NEGATIVE
Staphylococcus aureus: POSITIVE — AB

## 2018-05-30 LAB — COMPREHENSIVE METABOLIC PANEL
ALT: 50 U/L — ABNORMAL HIGH (ref 0–44)
AST: 52 U/L — ABNORMAL HIGH (ref 15–41)
Albumin: 4.4 g/dL (ref 3.5–5.0)
Alkaline Phosphatase: 85 U/L (ref 38–126)
Anion gap: 8 (ref 5–15)
BUN: 12 mg/dL (ref 6–20)
CO2: 27 mmol/L (ref 22–32)
Calcium: 9.2 mg/dL (ref 8.9–10.3)
Chloride: 102 mmol/L (ref 98–111)
Creatinine, Ser: 0.65 mg/dL (ref 0.44–1.00)
GFR calc Af Amer: >60 mL/min (ref >60)
GFR calc non Af Amer: >60 mL/min (ref >60)
Glucose, Bld: 103 mg/dL — ABNORMAL HIGH (ref 70–99)
Potassium: 3.9 mmol/L (ref 3.5–5.1)
Sodium: 137 mmol/L (ref 135–145)
Total Bilirubin: 0.4 mg/dL (ref 0.3–1.2)
Total Protein: 7.5 g/dL (ref 6.5–8.1)

## 2018-05-31 ENCOUNTER — Ambulatory Visit: Payer: BC Managed Care – PPO | Admitting: Family Medicine

## 2018-05-31 ENCOUNTER — Telehealth: Payer: Self-pay

## 2018-05-31 NOTE — Telephone Encounter (Signed)
-----   Message from Midge Miniumarren Wohl, MD sent at 05/29/2018  9:42 AM EST ----- The patient know that her liver enzymes have significantly decreased and 1 of them has even returned to normal.  She should have these checked in 1 more month to see if they have come back to normal.

## 2018-05-31 NOTE — Telephone Encounter (Signed)
Pt notified of lab results

## 2018-06-04 ENCOUNTER — Observation Stay (HOSPITAL_COMMUNITY)
Admission: RE | Admit: 2018-06-04 | Discharge: 2018-06-05 | Disposition: A | Discharge status: Home / Self Care | Payer: BC Managed Care – PPO | Source: Ambulatory Visit | Attending: Orthopedic Surgery | Admitting: Orthopedic Surgery

## 2018-06-04 ENCOUNTER — Ambulatory Visit (HOSPITAL_COMMUNITY): Payer: BC Managed Care – PPO

## 2018-06-04 ENCOUNTER — Ambulatory Visit (HOSPITAL_COMMUNITY): Payer: BC Managed Care – PPO | Admitting: Certified Registered Nurse Anesthetist

## 2018-06-04 ENCOUNTER — Encounter (HOSPITAL_COMMUNITY)
Admission: RE | Disposition: A | Discharge status: Home / Self Care | Payer: Self-pay | Source: Ambulatory Visit | Attending: Orthopedic Surgery

## 2018-06-04 ENCOUNTER — Encounter (HOSPITAL_COMMUNITY): Payer: Self-pay | Admitting: *Deleted

## 2018-06-04 ENCOUNTER — Other Ambulatory Visit: Payer: Self-pay

## 2018-06-04 ENCOUNTER — Inpatient Hospital Stay (HOSPITAL_COMMUNITY): Payer: BC Managed Care – PPO

## 2018-06-04 DIAGNOSIS — Z87891 Personal history of nicotine dependence: Secondary | ICD-10-CM | POA: Insufficient documentation

## 2018-06-04 DIAGNOSIS — Z96641 Presence of right artificial hip joint: Secondary | ICD-10-CM | POA: Diagnosis not present

## 2018-06-04 DIAGNOSIS — M1612 Unilateral primary osteoarthritis, left hip: Secondary | ICD-10-CM | POA: Diagnosis not present

## 2018-06-04 DIAGNOSIS — I10 Essential (primary) hypertension: Secondary | ICD-10-CM | POA: Diagnosis not present

## 2018-06-04 DIAGNOSIS — Z79899 Other long term (current) drug therapy: Secondary | ICD-10-CM | POA: Insufficient documentation

## 2018-06-04 DIAGNOSIS — Z96642 Presence of left artificial hip joint: Secondary | ICD-10-CM

## 2018-06-04 DIAGNOSIS — Z96649 Presence of unspecified artificial hip joint: Secondary | ICD-10-CM

## 2018-06-04 DIAGNOSIS — Z791 Long term (current) use of non-steroidal anti-inflammatories (NSAID): Secondary | ICD-10-CM | POA: Diagnosis not present

## 2018-06-04 DIAGNOSIS — Z6826 Body mass index (BMI) 26.0-26.9, adult: Secondary | ICD-10-CM | POA: Diagnosis not present

## 2018-06-04 DIAGNOSIS — E663 Overweight: Secondary | ICD-10-CM | POA: Diagnosis present

## 2018-06-04 DIAGNOSIS — Z419 Encounter for procedure for purposes other than remedying health state, unspecified: Secondary | ICD-10-CM

## 2018-06-04 HISTORY — PX: TOTAL HIP ARTHROPLASTY: SHX124

## 2018-06-04 LAB — TYPE AND SCREEN
ABO/RH(D): A POS
Antibody Screen: NEGATIVE

## 2018-06-04 SURGERY — ARTHROPLASTY, HIP, TOTAL, ANTERIOR APPROACH
Anesthesia: General | Site: Hip | Laterality: Left

## 2018-06-04 MED ORDER — ONDANSETRON HCL 4 MG/2ML IJ SOLN: 4.0000 mg | Freq: Four times a day (QID) | INTRAMUSCULAR | Status: DC | PRN

## 2018-06-04 MED ORDER — POLYETHYLENE GLYCOL 3350 17 G PO PACK: 17.0000 g | PACK | Freq: Two times a day (BID) | ORAL | 0 refills | Status: DC

## 2018-06-04 MED ORDER — DOCUSATE SODIUM 100 MG PO CAPS
100.0000 mg | ORAL_CAPSULE | Freq: Two times a day (BID) | ORAL | Status: DC
  Administered 2018-06-04 – 2018-06-05 (×2): 100 mg via ORAL
  Filled 2018-06-04 (×2): qty 1

## 2018-06-04 MED ORDER — METHOCARBAMOL 500 MG PO TABS: 500.0000 mg | ORAL_TABLET | Freq: Four times a day (QID) | ORAL | 0 refills | Status: DC | PRN

## 2018-06-04 MED ORDER — ONDANSETRON HCL 4 MG/2ML IJ SOLN
INTRAMUSCULAR | Status: DC | PRN
  Administered 2018-06-04: 4 mg via INTRAVENOUS

## 2018-06-04 MED ORDER — MAGNESIUM CITRATE PO SOLN: 1.0000 | Freq: Once | ORAL | Status: DC | PRN

## 2018-06-04 MED ORDER — METHOCARBAMOL 500 MG IVPB - SIMPLE MED
500.0000 mg | Freq: Four times a day (QID) | INTRAVENOUS | Status: DC | PRN
  Administered 2018-06-04: 500 mg via INTRAVENOUS
  Filled 2018-06-04: qty 50

## 2018-06-04 MED ORDER — OXYCODONE HCL 5 MG PO TABS: 5.0000 mg | ORAL_TABLET | ORAL | 0 refills | Status: DC | PRN

## 2018-06-04 MED ORDER — TRANEXAMIC ACID-NACL 1000-0.7 MG/100ML-% IV SOLN
1000.0000 mg | Freq: Once | INTRAVENOUS | Status: AC
  Administered 2018-06-04: 1000 mg via INTRAVENOUS
  Filled 2018-06-04: qty 100

## 2018-06-04 MED ORDER — FENTANYL CITRATE (PF) 100 MCG/2ML IJ SOLN
25.0000 ug | INTRAMUSCULAR | Status: DC | PRN
  Administered 2018-06-04 (×3): 50 ug via INTRAVENOUS

## 2018-06-04 MED ORDER — LIDOCAINE 2% (20 MG/ML) 5 ML SYRINGE
INTRAMUSCULAR | Status: DC | PRN
  Administered 2018-06-04: 60 mg via INTRAVENOUS

## 2018-06-04 MED ORDER — FENTANYL CITRATE (PF) 100 MCG/2ML IJ SOLN: 25.0000 ug | INTRAMUSCULAR | Status: DC | PRN

## 2018-06-04 MED ORDER — DEXAMETHASONE SODIUM PHOSPHATE 10 MG/ML IJ SOLN
10.0000 mg | Freq: Once | INTRAMUSCULAR | Status: AC
  Administered 2018-06-04: 10 mg via INTRAVENOUS

## 2018-06-04 MED ORDER — AMLODIPINE BESYLATE 5 MG PO TABS
5.0000 mg | ORAL_TABLET | Freq: Every day | ORAL | Status: DC
  Administered 2018-06-05: 5 mg via ORAL
  Filled 2018-06-04: qty 1

## 2018-06-04 MED ORDER — PHENYLEPHRINE 8 MG IN D5W 100 ML (0.08MG/ML) PREMIX OPTIME
INJECTION | INTRAVENOUS | Status: DC | PRN
  Administered 2018-06-04: 50 ug/min via INTRAVENOUS

## 2018-06-04 MED ORDER — OXYCODONE HCL 5 MG PO TABS
10.0000 mg | ORAL_TABLET | ORAL | Status: DC | PRN
  Administered 2018-06-05 (×2): 10 mg via ORAL
  Filled 2018-06-04 (×2): qty 2

## 2018-06-04 MED ORDER — CEFAZOLIN SODIUM-DEXTROSE 2-4 GM/100ML-% IV SOLN
2.0000 g | Freq: Four times a day (QID) | INTRAVENOUS | Status: AC
  Administered 2018-06-04 (×2): 2 g via INTRAVENOUS
  Filled 2018-06-04 (×2): qty 100

## 2018-06-04 MED ORDER — PROPOFOL 10 MG/ML IV BOLUS
INTRAVENOUS | Status: DC | PRN
  Administered 2018-06-04: 200 mg via INTRAVENOUS

## 2018-06-04 MED ORDER — METHOCARBAMOL 500 MG IVPB - SIMPLE MED
INTRAVENOUS | Status: AC
  Filled 2018-06-04: qty 50

## 2018-06-04 MED ORDER — CEFAZOLIN SODIUM-DEXTROSE 2-4 GM/100ML-% IV SOLN
2.0000 g | INTRAVENOUS | Status: AC
  Administered 2018-06-04: 2 g via INTRAVENOUS
  Filled 2018-06-04: qty 100

## 2018-06-04 MED ORDER — FENTANYL CITRATE (PF) 100 MCG/2ML IJ SOLN
INTRAMUSCULAR | Status: AC
  Filled 2018-06-04: qty 2

## 2018-06-04 MED ORDER — FENTANYL CITRATE (PF) 100 MCG/2ML IJ SOLN: 50.0000 ug | Freq: Once | INTRAMUSCULAR | Status: DC

## 2018-06-04 MED ORDER — PHENOL 1.4 % MT LIQD: 1.0000 | OROMUCOSAL | Status: DC | PRN

## 2018-06-04 MED ORDER — FERROUS SULFATE 325 (65 FE) MG PO TABS
325.0000 mg | ORAL_TABLET | Freq: Three times a day (TID) | ORAL | Status: DC
  Administered 2018-06-04 – 2018-06-05 (×2): 325 mg via ORAL
  Filled 2018-06-04 (×2): qty 1

## 2018-06-04 MED ORDER — MIDAZOLAM HCL 2 MG/2ML IJ SOLN: 1.0000 mg | Freq: Once | INTRAMUSCULAR | Status: DC

## 2018-06-04 MED ORDER — TRANEXAMIC ACID-NACL 1000-0.7 MG/100ML-% IV SOLN
1000.0000 mg | INTRAVENOUS | Status: AC
  Administered 2018-06-04: 1000 mg via INTRAVENOUS
  Filled 2018-06-04: qty 100

## 2018-06-04 MED ORDER — LACTATED RINGERS IV SOLN
INTRAVENOUS | Status: DC
  Administered 2018-06-04 (×3): via INTRAVENOUS

## 2018-06-04 MED ORDER — PROPOFOL 10 MG/ML IV BOLUS
INTRAVENOUS | Status: AC
  Filled 2018-06-04: qty 20

## 2018-06-04 MED ORDER — DIPHENHYDRAMINE HCL 12.5 MG/5ML PO ELIX: 12.5000 mg | ORAL_SOLUTION | ORAL | Status: DC | PRN

## 2018-06-04 MED ORDER — AMLODIPINE BESYLATE 5 MG PO TABS
5.0000 mg | ORAL_TABLET | Freq: Once | ORAL | Status: AC
  Administered 2018-06-04: 5 mg via ORAL
  Filled 2018-06-04: qty 1

## 2018-06-04 MED ORDER — EPHEDRINE SULFATE-NACL 50-0.9 MG/10ML-% IV SOSY
PREFILLED_SYRINGE | INTRAVENOUS | Status: DC | PRN
  Administered 2018-06-04: 5 mg via INTRAVENOUS
  Administered 2018-06-04: 10 mg via INTRAVENOUS
  Administered 2018-06-04: 5 mg via INTRAVENOUS
  Administered 2018-06-04: 15 mg via INTRAVENOUS
  Administered 2018-06-04: 10 mg via INTRAVENOUS
  Administered 2018-06-04: 5 mg via INTRAVENOUS

## 2018-06-04 MED ORDER — MIDAZOLAM HCL 5 MG/5ML IJ SOLN
INTRAMUSCULAR | Status: DC | PRN
  Administered 2018-06-04: 2 mg via INTRAVENOUS

## 2018-06-04 MED ORDER — HYDROMORPHONE HCL 1 MG/ML IJ SOLN: 0.5000 mg | INTRAMUSCULAR | Status: DC | PRN

## 2018-06-04 MED ORDER — CHLORHEXIDINE GLUCONATE 4 % EX LIQD: 60.0000 mL | Freq: Once | CUTANEOUS | Status: DC

## 2018-06-04 MED ORDER — METOCLOPRAMIDE HCL 5 MG/ML IJ SOLN: 5.0000 mg | Freq: Three times a day (TID) | INTRAMUSCULAR | Status: DC | PRN

## 2018-06-04 MED ORDER — FENTANYL CITRATE (PF) 250 MCG/5ML IJ SOLN
INTRAMUSCULAR | Status: AC
  Filled 2018-06-04: qty 5

## 2018-06-04 MED ORDER — MENTHOL 3 MG MT LOZG: 1.0000 | LOZENGE | OROMUCOSAL | Status: DC | PRN

## 2018-06-04 MED ORDER — PHENYLEPHRINE HCL 10 MG/ML IJ SOLN
INTRAMUSCULAR | Status: AC
  Filled 2018-06-04: qty 2

## 2018-06-04 MED ORDER — BISACODYL 10 MG RE SUPP: 10.0000 mg | Freq: Every day | RECTAL | Status: DC | PRN

## 2018-06-04 MED ORDER — ALUM & MAG HYDROXIDE-SIMETH 200-200-20 MG/5ML PO SUSP: 15.0000 mL | ORAL | Status: DC | PRN

## 2018-06-04 MED ORDER — ASPIRIN 81 MG PO CHEW: 81.0000 mg | CHEWABLE_TABLET | Freq: Two times a day (BID) | ORAL | 0 refills | Status: DC

## 2018-06-04 MED ORDER — SODIUM CHLORIDE 0.9 % IR SOLN
Status: DC | PRN
  Administered 2018-06-04: 1000 mL

## 2018-06-04 MED ORDER — ASPIRIN 81 MG PO CHEW
81.0000 mg | CHEWABLE_TABLET | Freq: Two times a day (BID) | ORAL | Status: DC
  Administered 2018-06-04 – 2018-06-05 (×2): 81 mg via ORAL
  Filled 2018-06-04 (×2): qty 1

## 2018-06-04 MED ORDER — SUGAMMADEX SODIUM 200 MG/2ML IV SOLN
INTRAVENOUS | Status: DC | PRN
  Administered 2018-06-04: 200 mg via INTRAVENOUS

## 2018-06-04 MED ORDER — OXYCODONE HCL 5 MG PO TABS
5.0000 mg | ORAL_TABLET | ORAL | Status: DC | PRN
  Administered 2018-06-04: 5 mg via ORAL
  Filled 2018-06-04 (×2): qty 1

## 2018-06-04 MED ORDER — METOCLOPRAMIDE HCL 5 MG PO TABS: 5.0000 mg | ORAL_TABLET | Freq: Three times a day (TID) | ORAL | Status: DC | PRN

## 2018-06-04 MED ORDER — FENTANYL CITRATE (PF) 100 MCG/2ML IJ SOLN
INTRAMUSCULAR | Status: DC | PRN
  Administered 2018-06-04 (×4): 50 ug via INTRAVENOUS
  Administered 2018-06-04: 100 ug via INTRAVENOUS
  Administered 2018-06-04: 50 ug via INTRAVENOUS

## 2018-06-04 MED ORDER — ONDANSETRON HCL 4 MG PO TABS: 4.0000 mg | ORAL_TABLET | Freq: Four times a day (QID) | ORAL | Status: DC | PRN

## 2018-06-04 MED ORDER — IRBESARTAN 150 MG PO TABS
300.0000 mg | ORAL_TABLET | Freq: Every day | ORAL | Status: DC
  Administered 2018-06-05: 300 mg via ORAL
  Filled 2018-06-04: qty 2

## 2018-06-04 MED ORDER — PHENYLEPHRINE 40 MCG/ML (10ML) SYRINGE FOR IV PUSH (FOR BLOOD PRESSURE SUPPORT)
PREFILLED_SYRINGE | INTRAVENOUS | Status: DC | PRN
  Administered 2018-06-04 (×5): 160 ug via INTRAVENOUS
  Administered 2018-06-04 (×2): 80 ug via INTRAVENOUS
  Administered 2018-06-04 (×2): 160 ug via INTRAVENOUS
  Administered 2018-06-04: 80 ug via INTRAVENOUS

## 2018-06-04 MED ORDER — DOCUSATE SODIUM 100 MG PO CAPS: 100.0000 mg | ORAL_CAPSULE | Freq: Two times a day (BID) | ORAL | 0 refills | Status: DC

## 2018-06-04 MED ORDER — METHOCARBAMOL 500 MG PO TABS
500.0000 mg | ORAL_TABLET | Freq: Four times a day (QID) | ORAL | Status: DC | PRN
  Administered 2018-06-05: 500 mg via ORAL
  Filled 2018-06-04 (×2): qty 1

## 2018-06-04 MED ORDER — FERROUS SULFATE 325 (65 FE) MG PO TABS: 325.0000 mg | ORAL_TABLET | Freq: Three times a day (TID) | ORAL | 3 refills | Status: DC

## 2018-06-04 MED ORDER — AMLODIPINE-OLMESARTAN 5-40 MG PO TABS: 1.0000 | ORAL_TABLET | Freq: Every day | ORAL | Status: DC

## 2018-06-04 MED ORDER — AMITRIPTYLINE HCL 25 MG PO TABS: 25.0000 mg | ORAL_TABLET | Freq: Every evening | ORAL | Status: DC | PRN

## 2018-06-04 MED ORDER — PROPOFOL 10 MG/ML IV BOLUS
INTRAVENOUS | Status: AC
  Filled 2018-06-04: qty 60

## 2018-06-04 MED ORDER — ROCURONIUM BROMIDE 10 MG/ML (PF) SYRINGE
PREFILLED_SYRINGE | INTRAVENOUS | Status: DC | PRN
  Administered 2018-06-04: 20 mg via INTRAVENOUS
  Administered 2018-06-04: 50 mg via INTRAVENOUS

## 2018-06-04 MED ORDER — CELECOXIB 200 MG PO CAPS
200.0000 mg | ORAL_CAPSULE | Freq: Two times a day (BID) | ORAL | Status: DC
  Administered 2018-06-04: 200 mg via ORAL
  Filled 2018-06-04 (×2): qty 1

## 2018-06-04 MED ORDER — DEXAMETHASONE SODIUM PHOSPHATE 10 MG/ML IJ SOLN
10.0000 mg | Freq: Once | INTRAMUSCULAR | Status: AC
  Administered 2018-06-05: 10 mg via INTRAVENOUS
  Filled 2018-06-04: qty 1

## 2018-06-04 MED ORDER — POLYETHYLENE GLYCOL 3350 17 G PO PACK: 17.0000 g | PACK | Freq: Two times a day (BID) | ORAL | Status: DC

## 2018-06-04 MED ORDER — SODIUM CHLORIDE 0.9 % IV SOLN
INTRAVENOUS | Status: DC
  Administered 2018-06-04 – 2018-06-05 (×2): via INTRAVENOUS

## 2018-06-04 MED ORDER — MIDAZOLAM HCL 2 MG/2ML IJ SOLN
INTRAMUSCULAR | Status: AC
  Filled 2018-06-04: qty 2

## 2018-06-04 SURGICAL SUPPLY — 44 items
BAG DECANTER FOR FLEXI CONT (MISCELLANEOUS) IMPLANT
BAG ZIPLOCK 12X15 (MISCELLANEOUS) ×2 IMPLANT
BLADE SAG 18X100X1.27 (BLADE) ×2 IMPLANT
BLADE SURG SZ10 CARB STEEL (BLADE) ×4 IMPLANT
COVER PERINEAL POST (MISCELLANEOUS) ×2 IMPLANT
COVER SURGICAL LIGHT HANDLE (MISCELLANEOUS) ×2 IMPLANT
COVER WAND RF STERILE (DRAPES) IMPLANT
CUP ACETBLR 52 OD PINNACLE (Hips) ×2 IMPLANT
DERMABOND ADVANCED (GAUZE/BANDAGES/DRESSINGS) ×1
DERMABOND ADVANCED .7 DNX12 (GAUZE/BANDAGES/DRESSINGS) ×1 IMPLANT
DRAPE STERI IOBAN 125X83 (DRAPES) ×2 IMPLANT
DRAPE U-SHAPE 47X51 STRL (DRAPES) ×4 IMPLANT
DRESSING AQUACEL AG SP 3.5X10 (GAUZE/BANDAGES/DRESSINGS) ×1 IMPLANT
DRSG AQUACEL AG SP 3.5X10 (GAUZE/BANDAGES/DRESSINGS) ×2
DURAPREP 26ML APPLICATOR (WOUND CARE) ×2 IMPLANT
ELECT REM PT RETURN 15FT ADLT (MISCELLANEOUS) ×2 IMPLANT
ELIMINATOR HOLE APEX DEPUY (Hips) ×2 IMPLANT
GLOVE BIOGEL M STRL SZ7.5 (GLOVE) IMPLANT
GLOVE BIOGEL PI IND STRL 7.5 (GLOVE) ×1 IMPLANT
GLOVE BIOGEL PI IND STRL 8.5 (GLOVE) ×1 IMPLANT
GLOVE BIOGEL PI INDICATOR 7.5 (GLOVE) ×1
GLOVE BIOGEL PI INDICATOR 8.5 (GLOVE) ×1
GLOVE ECLIPSE 8.0 STRL XLNG CF (GLOVE) ×4 IMPLANT
GLOVE ORTHO TXT STRL SZ7.5 (GLOVE) ×2 IMPLANT
GOWN STRL REUS W/TWL 2XL LVL3 (GOWN DISPOSABLE) ×2 IMPLANT
GOWN STRL REUS W/TWL LRG LVL3 (GOWN DISPOSABLE) ×2 IMPLANT
HEAD CERAMIC 36 PLUS5 (Hips) ×2 IMPLANT
HOLDER FOLEY CATH W/STRAP (MISCELLANEOUS) ×2 IMPLANT
LINER NEUTRAL 52X36MM PLUS 4 (Liner) ×2 IMPLANT
PACK ANTERIOR HIP CUSTOM (KITS) ×2 IMPLANT
SCREW 6.5MMX30MM (Screw) ×2 IMPLANT
STEM FEMORAL SZ 5MM STD ACTIS (Stem) ×2 IMPLANT
SUT MNCRL AB 4-0 PS2 18 (SUTURE) ×2 IMPLANT
SUT STRATAFIX 0 PDS 27 VIOLET (SUTURE) ×2
SUT VIC AB 1 CT1 27 (SUTURE) ×2
SUT VIC AB 1 CT1 27XBRD ANTBC (SUTURE) ×2 IMPLANT
SUT VIC AB 1 CT1 36 (SUTURE) ×6 IMPLANT
SUT VIC AB 2-0 CT1 27 (SUTURE) ×2
SUT VIC AB 2-0 CT1 TAPERPNT 27 (SUTURE) ×2 IMPLANT
SUTURE STRATFX 0 PDS 27 VIOLET (SUTURE) ×1 IMPLANT
TRAY FOLEY CATH 14FR (SET/KITS/TRAYS/PACK) ×2 IMPLANT
TRAY FOLEY MTR SLVR 16FR STAT (SET/KITS/TRAYS/PACK) IMPLANT
WATER STERILE IRR 1000ML POUR (IV SOLUTION) ×2 IMPLANT
YANKAUER SUCT BULB TIP 10FT TU (MISCELLANEOUS) IMPLANT

## 2018-06-04 NOTE — Anesthesia Procedure Notes (Signed)
Procedure Name: Intubation Date/Time: 06/04/2018 11:49 AM Performed by: Mitzie Na, CRNA Pre-anesthesia Checklist: Patient identified, Emergency Drugs available, Suction available, Patient being monitored and Timeout performed Patient Re-evaluated:Patient Re-evaluated prior to induction Oxygen Delivery Method: Circle system utilized Preoxygenation: Pre-oxygenation with 100% oxygen Induction Type: IV induction Ventilation: Mask ventilation without difficulty Laryngoscope Size: Mac and 3 Grade View: Grade I Tube type: Oral Tube size: 7.0 mm Number of attempts: 1 Airway Equipment and Method: Stylet Placement Confirmation: ETT inserted through vocal cords under direct vision,  positive ETCO2 and breath sounds checked- equal and bilateral Secured at: 24 cm Tube secured with: Tape Dental Injury: Teeth and Oropharynx as per pre-operative assessment

## 2018-06-04 NOTE — Anesthesia Postprocedure Evaluation (Signed)
Anesthesia Post Note  Patient: Roselle LocusElizabeth Pacitti  Procedure(s) Performed: LEFT TOTAL HIP ARTHROPLASTY ANTERIOR APPROACH (Left Hip)     Patient location during evaluation: PACU Anesthesia Type: General Level of consciousness: awake Pain management: pain level controlled Vital Signs Assessment: post-procedure vital signs reviewed and stable Respiratory status: spontaneous breathing Cardiovascular status: stable Anesthetic complications: no    Last Vitals:  Vitals:   06/04/18 1633 06/04/18 1736  BP: 125/80 127/76  Pulse: 91 94  Resp: 18 18  Temp: 36.7 C 36.7 C  SpO2: 100% 98%    Last Pain:  Vitals:   06/04/18 1646  TempSrc:   PainSc: 2     LLE Motor Response: Purposeful movement (06/04/18 1830) LLE Sensation: Full sensation (06/04/18 1830) RLE Motor Response: Purposeful movement (06/04/18 1830) RLE Sensation: Full sensation (06/04/18 1830)      Salman Wellen

## 2018-06-04 NOTE — Op Note (Signed)
NAME:  Carolyn Wood                ACCOUNT NO.: [id number]      MEDICAL RECORD NO.: [id number]      FACILITY:  [facility]      PHYSICIAN:  Shelda Pal  DATE OF BIRTH:  [birth year]     DATE OF PROCEDURE:  06/04/2018                                 OPERATIVE REPORT         PREOPERATIVE DIAGNOSIS: Left  hip osteoarthritis.      POSTOPERATIVE DIAGNOSIS:  Left hip osteoarthritis.      PROCEDURE:  Left total hip replacement through an anterior approach   utilizing DePuy THR system, component size 52mm pinnacle cup, a size 36+4 neutral   Altrex liner, a size 5 standard Actis stem with a 36+5 delta ceramic   ball.      SURGEON:  Madlyn Frankel. Charlann Boxer, M.D.      ASSISTANT:  Skip Mayer, PA-C     ANESTHESIA:  General.      SPECIMENS:  None.      COMPLICATIONS:  None.      BLOOD LOSS:  300 cc     DRAINS:  None.      INDICATION OF THE PROCEDURE:  Carolyn Wood is a 57 y.o. female who had   presented to office for evaluation of left hip pain.  Radiographs revealed   progressive degenerative changes with bone-on-bone   articulation of the  hip joint, including subchondral cystic changes and osteophytes.  The patient had painful limited range of   motion significantly affecting their overall quality of life and function.  The patient was failing to    respond to conservative measures including medications and/or injections and activity modification and at this point was ready   to proceed with more definitive measures.  Consent was obtained for   benefit of pain relief.  Specific risks of infection, DVT, component   failure, dislocation, neurovascular injury, and need for revision surgery were reviewed in the office as well discussion of   the anterior versus posterior approach were reviewed.     PROCEDURE IN DETAIL:  The patient was brought to operative theater.   Once adequate anesthesia, preoperative antibiotics, 2 gm of Ancef, 1 gm of Tranexamic  Acid, and 10 mg of Decadron were administered, the patient was positioned supine on the [employer] table.  Once the patient was safely positioned with adequate padding of boney prominences we predraped out the hip, and used fluoroscopy to confirm orientation of the pelvis.      The left hip was then prepped and draped from proximal iliac crest to   mid thigh with a shower curtain technique.      Time-out was performed identifying the patient, planned procedure, and the appropriate extremity.     An incision was then made 2 cm lateral to the   anterior superior iliac spine extending over the orientation of the   tensor fascia lata muscle and sharp dissection was carried down to the   fascia of the muscle.      The fascia was then incised.  The muscle belly was identified and swept   laterally and retractor placed along the superior neck.  Following   cauterization of the circumflex vessels and removing some pericapsular  fat, a second cobra retractor was placed on the inferior neck.  A T-capsulotomy was made along the line of the   superior neck to the trochanteric fossa, then extended proximally and   distally.  Tag sutures were placed and the retractors were then placed   intracapsular.  We then identified the trochanteric fossa and   orientation of my neck cut and then made a neck osteotomy with the femur on traction.  The femoral   head was removed without difficulty or complication.  Traction was let   off and retractors were placed posterior and anterior around the   acetabulum.      The labrum and foveal tissue were debrided.  I began reaming with a 46 mm   reamer and reamed up to 51 mm reamer with good bony bed preparation and a 52 mm  cup was chosen.  The final 52 mm Pinnacle cup was then impacted under fluoroscopy to confirm the depth of penetration and orientation with respect to   Abduction and forward flexion.  A screw was placed into the ilium followed by the hole eliminator.   The final   36+4 neutral Altrex liner was impacted with good visualized rim fit.  The cup was positioned anatomically within the acetabular portion of the pelvis.      At this point, the femur was rolled to 100 degrees.  Further capsule was   released off the inferior aspect of the femoral neck.  I then   released the superior capsule proximally.  With the leg in a neutral position the hook was placed laterally   along the femur under the vastus lateralis origin and elevated manually and then held in position using the hook attachment on the bed.  The leg was then extended and adducted with the leg rolled to 100   degrees of external rotation.  Retractors were placed along the medial calcar and posteriorly over the greater trochanter.  Once the proximal femur was fully   exposed, I used a box osteotome to set orientation.  I then began   broaching with the starting chili pepper broach and passed this by hand and then broached up to 5.  With the 5 broach in place I chose a standard offset neck and did several trial reductions.  The offset was appropriate, leg lengths   appeared to be equal best matched with the +5 head ball trial confirmed radiographically.   Given these findings, I went ahead and dislocated the hip, repositioned all   retractors and positioned the right hip in the extended and abducted position.  The final 5 standard Actis femoral stem was   chosen and it was impacted down to the level of neck cut.  Based on this   and the trial reductions, a final 36+5 delta ceramic ball was chosen and   impacted onto a clean and dry trunnion, and the hip was reduced.  The   hip had been irrigated throughout the case again at this point.  I did   reapproximate the superior capsular leaflet to the anterior leaflet   using #1 Vicryl.  The fascia of the   tensor fascia lata muscle was then reapproximated using #1 Vicryl and #0 Stratafix sutures.  The   remaining wound was closed with 2-0 Vicryl  and running 4-0 Monocryl.   The hip was cleaned, dried, and dressed sterilely using Dermabond and   Aquacel dressing.  The patient was then brought   to recovery room  in stable condition tolerating the procedure well.    Skip MayerBlair Roberts, PA-C was present for the entirety of the case involved from   preoperative positioning, perioperative retractor management, general   facilitation of the case, as well as primary wound closure as assistant.            Madlyn FrankelMatthew D. Charlann Boxerlin, M.D.        06/04/2018 1:11 PM

## 2018-06-04 NOTE — Transfer of Care (Signed)
Immediate Anesthesia Transfer of Care Note  [name]  Procedure(s) Performed: LEFT TOTAL HIP ARTHROPLASTY ANTERIOR APPROACH (Left Hip)  Patient Location: PACU  Anesthesia Type:General  Level of Consciousness: awake, alert , oriented and patient cooperative  Airway & Oxygen Therapy: Patient Spontanous Breathing and Patient connected to face mask oxygen  Post-op Assessment: Report given to RN, Post -op Vital signs reviewed and stable and Patient moving all extremities  Post vital signs: Reviewed and stable  Last Vitals:  Vitals Value Taken Time  BP 130/91 06/04/2018  1:55 PM  Temp    Pulse 98 06/04/2018  1:57 PM  Resp 9 06/04/2018  1:57 PM  SpO2 100 % 06/04/2018  1:57 PM  Vitals shown include unvalidated device data.  Last Pain:  Vitals:   06/04/18 0910  TempSrc: Oral         Complications: No apparent anesthesia complications

## 2018-06-04 NOTE — Interval H&P Note (Signed)
History and Physical Interval Note:  06/04/2018 10:02 AM  Carolyn Wood  has presented today for surgery, with the diagnosis of Left hip osteoarthritis  The various methods of treatment have been discussed with the patient and family. After consideration of risks, benefits and other options for treatment, the patient has consented to  Procedure(s) with comments: LEFT TOTAL HIP ARTHROPLASTY ANTERIOR APPROACH (Left) - 70 mins as a surgical intervention .  The patient's history has been reviewed, patient examined, no change in status, stable for surgery.  I have reviewed the patient's chart and labs.  Questions were answered to the patient's satisfaction.     Shelda PalMatthew D Audria Takeshita

## 2018-06-04 NOTE — Anesthesia Preprocedure Evaluation (Addendum)
Anesthesia Evaluation  Patient identified by MRN, date of birth, ID band Patient awake    Reviewed: Allergy & Precautions, NPO status , Patient's Chart, lab work & pertinent test results  Airway Mallampati: II  TM Distance: >3 FB     Dental   Pulmonary former smoker,    breath sounds clear to auscultation       Cardiovascular hypertension,  Rhythm:Regular Rate:Normal     Neuro/Psych  Neuromuscular disease    GI/Hepatic negative GI ROS, Neg liver ROS,   Endo/Other  negative endocrine ROS  Renal/GU negative Renal ROS     Musculoskeletal  (+) Arthritis ,   Abdominal   Peds  Hematology   Anesthesia Other Findings   Reproductive/Obstetrics                             Anesthesia Physical Anesthesia Plan  ASA: III  Anesthesia Plan: General   Post-op Pain Management:    Induction: Intravenous  PONV Risk Score and Plan: 3 and Ondansetron, Dexamethasone and Midazolam  Airway Management Planned: Oral ETT  Additional Equipment:   Intra-op Plan:   Post-operative Plan: Extubation in OR  Informed Consent: I have reviewed the patients History and Physical, chart, labs and discussed the procedure including the risks, benefits and alternatives for the proposed anesthesia with the patient or authorized representative who has indicated his/her understanding and acceptance.   Dental advisory given  Plan Discussed with: CRNA and Anesthesiologist  Anesthesia Plan Comments:         Anesthesia Quick Evaluation
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
## 2018-06-04 NOTE — Discharge Instructions (Signed)

## 2018-06-05 ENCOUNTER — Encounter (HOSPITAL_COMMUNITY): Payer: Self-pay | Admitting: Orthopedic Surgery

## 2018-06-05 DIAGNOSIS — M1612 Unilateral primary osteoarthritis, left hip: Secondary | ICD-10-CM | POA: Diagnosis not present

## 2018-06-05 DIAGNOSIS — E663 Overweight: Secondary | ICD-10-CM | POA: Diagnosis present

## 2018-06-05 LAB — BASIC METABOLIC PANEL
Anion gap: 8 (ref 5–15)
BUN: 10 mg/dL (ref 6–20)
CO2: 24 mmol/L (ref 22–32)
Calcium: 8.3 mg/dL — ABNORMAL LOW (ref 8.9–10.3)
Chloride: 104 mmol/L (ref 98–111)
Creatinine, Ser: 0.5 mg/dL (ref 0.44–1.00)
GFR calc Af Amer: >60 mL/min (ref >60)
GFR calc non Af Amer: >60 mL/min (ref >60)
Glucose, Bld: 139 mg/dL — ABNORMAL HIGH (ref 70–99)
Potassium: 4 mmol/L (ref 3.5–5.1)
Sodium: 136 mmol/L (ref 135–145)

## 2018-06-05 LAB — CBC
HCT: 32.4 % — ABNORMAL LOW (ref 36.0–46.0)
Hemoglobin: 10.4 g/dL — ABNORMAL LOW (ref 12.0–15.0)
MCH: 30.2 pg (ref 26.0–34.0)
MCHC: 32.1 g/dL (ref 30.0–36.0)
MCV: 94.2 fL (ref 80.0–100.0)
Platelets: 306 10*3/uL (ref 150–400)
RBC: 3.44 MIL/uL — ABNORMAL LOW (ref 3.87–5.11)
RDW: 12.7 % (ref 11.5–15.5)
WBC: 13.3 10*3/uL — ABNORMAL HIGH (ref 4.0–10.5)
nRBC: 0 % (ref 0.0–0.2)

## 2018-06-05 MED ORDER — OXYCODONE HCL 5 MG PO TABS: 5.0000 mg | ORAL_TABLET | ORAL | 0 refills | Status: DC | PRN

## 2018-06-05 MED ORDER — METHOCARBAMOL 500 MG PO TABS: 500.0000 mg | ORAL_TABLET | Freq: Four times a day (QID) | ORAL | 0 refills | Status: DC | PRN

## 2018-06-05 MED ORDER — ASPIRIN 81 MG PO CHEW: 81.0000 mg | CHEWABLE_TABLET | Freq: Two times a day (BID) | ORAL | 0 refills | Status: AC

## 2018-06-05 NOTE — Progress Notes (Signed)
     Subjective: 1 Day Post-Op Procedure(s) (LRB): LEFT TOTAL HIP ARTHROPLASTY ANTERIOR APPROACH (Left)   Patient reports pain as mild, pain controlled.  No events throughout the night. Feels that this hip is more painful than the last, but still doing well.  Looking forward to getting d/c'ed home. Discussed that her doctor has already given her her analgesic medications for use after surgery.    Objective:   VITALS:   Vitals:   06/05/18 0138 06/05/18 0536  BP: 129/88 127/87  Pulse: (!) 105 98  Resp: 14 18  Temp: 98.8 F (37.1 C) 98.3 F (36.8 C)  SpO2: 97% 97%    Dorsiflexion/Plantar flexion intact Incision: dressing C/D/I No cellulitis present Compartment soft  LABS Recent Labs    06/05/18 0341  HGB 10.4*  HCT 32.4*  WBC 13.3*  PLT 306    Recent Labs    06/05/18 0341  NA 136  K 4.0  BUN 10  CREATININE 0.50  GLUCOSE 139*     Assessment/Plan: 1 Day Post-Op Procedure(s) (LRB): LEFT TOTAL HIP ARTHROPLASTY ANTERIOR APPROACH (Left) Foley cath d/c'ed Advance diet Up with therapy D/C IV fluids Discharge home [facility] Follow up with OLIN,Traven Davids D in 2 weeks.  Contact information:  [facility]) [address], [address] [place] [id number] [phone]    Overweight (BMI 25-29.9) Estimated body mass index is 26.3 kg/m as calculated from the following:   Height as of this encounter: 5\' 8"  (1.727 m).   Weight as of this encounter: 78.5 kg. Patient also counseled that weight may inhibit the healing process Patient counseled that losing weight will help with future health issues      Anastasio AuerbachMatthew S. Tucker Steedley   PAC  06/05/2018, 8:49 AM

## 2018-06-05 NOTE — Plan of Care (Signed)
  Problem: Education: Goal: Knowledge of General Education information will improve Description Including pain rating scale, medication(s)/side effects and non-pharmacologic comfort measures Outcome: Progressing   Problem: Health Behavior/Discharge Planning: Goal: Ability to manage health-related needs will improve Outcome: Progressing   Problem: Clinical Measurements: Goal: Respiratory complications will improve Outcome: Progressing   Problem: Skin Integrity: Goal: Risk for impaired skin integrity will decrease Outcome: Progressing

## 2018-06-05 NOTE — Progress Notes (Signed)
Physical Therapy Treatment Patient Details Name: Carolyn Wood MRN: [id number] DOB: 1960/07/06 Today's Date: 06/05/2018    History of Present Illness 57 yo female s/p L DA-THA on 06/04/18. PMH includes OA, depression, HTN, tinnitus, anxiety, R THA 2014.    PT Comments    Patient ready for Dc. Ambulating without RW.  Follow Up Recommendations  Follow surgeon's recommendation for DC plan and follow-up therapies;Supervision for mobility/OOB     Equipment Recommendations  Rolling walker with 5" wheels    Recommendations for Other Services       Precautions / Restrictions Precautions Precautions: Fall    Mobility  Bed Mobility               General bed mobility comments: in recliner  Transfers Overall transfer level: Modified independent Equipment used: Rolling walker (2 wheeled);None                Ambulation/Gait Ambulation/Gait assistance: Modified independent (Device/Increase time) Gait Distance (Feet): 80 Feet Assistive device: Rolling walker (2 wheeled);None Gait Pattern/deviations: Step-through pattern     General Gait Details: took steps without use of RW.   Stairs Stairs: Yes Stairs assistance: Supervision Stair Management: Alternating pattern;Step to pattern Number of Stairs: 3 General stair comments: x 3, patient performed reciprocally.   Wheelchair Mobility    Modified Rankin (Stroke Patients Only)       Balance                                            Cognition Arousal/Alertness: Awake/alert                                     General Comments: patient not following precautions for safety      Exercises Total Joint Exercises Hip ABduction/ADduction: AROM;Left;10 reps Long Arc Quad: AROM;Left;10 reps    General Comments        Pertinent Vitals/Pain Pain Score: 2  Pain Descriptors / Indicators: Aching Pain Intervention(s): Limited activity within patient's  tolerance;Monitored during session;Premedicated before session;Repositioned;Ice applied    Home Living                      Prior Function            PT Goals (current goals can now be found in the care plan section)      Frequency    7X/week      PT Plan Current plan remains appropriate    Co-evaluation              AM-PAC PT "6 Clicks" Mobility   Outcome Measure  Help needed turning from your back to your side while in a flat bed without using bedrails?: None Help needed moving from lying on your back to sitting on the side of a flat bed without using bedrails?: None Help needed moving to and from a bed to a chair (including a wheelchair)?: None Help needed standing up from a chair using your arms (e.g., wheelchair or bedside chair)?: None Help needed to walk in hospital room?: A Little Help needed climbing 3-5 steps with a railing? : A Lot 6 Click Score: 21    End of Session   Activity Tolerance: Patient tolerated treatment well Patient left: in chair Nurse Communication: Mobility status PT Visit Diagnosis:  Other abnormalities of gait and mobility (R26.89);Pain Pain - Right/Left: Left Pain - part of body: Hip     Time: 1125-1140 PT Time Calculation (min) (ACUTE ONLY): 15 min  Charges:  $Gait Training: 8-22 mins                     Blanchard KelchKaren Karter Haire PT Acute Rehabilitation Services Pager [phone] Office [phone]    Rada HayHill, Faron Tudisco [place] 06/05/2018, 1:57 PM

## 2018-06-05 NOTE — Discharge Summary (Signed)
Physician Discharge Summary  Patient ID: Carolyn Wood MRN: [id number] DOB/AGE: March 27, 1961 57 y.o.  Admit date: 06/04/2018 Discharge date: 06/05/2018   Procedures:  Procedure(s) (LRB): LEFT TOTAL HIP ARTHROPLASTY ANTERIOR APPROACH (Left)  Attending Physician:  Dr. Durene Romans   Admission Diagnoses:   Left hip primary OA / pain  Discharge Diagnoses:  Active Problems:   S/P hip replacement   S/P left THA, AA   Overweight (BMI 25.0-29.9)  Past Medical History:  Diagnosis Date  . Anxiety   . Arthritis   . Complication of anesthesia    combative when waking up  . Elevated liver enzymes   . History of Papanicolaou smear of cervix 11/05/2013   RNIL/NEG  . HTN (hypertension)   . Seasonal allergies     HPI:    Carolyn Wood, 57 y.o. female, has a history of pain and functional disability in the left hip(s) due to arthritis and patient has failed non-surgical conservative treatments for greater than 12 weeks to include NSAID's and/or analgesics, corticosteriod injections, use of assistive devices and activity modification.  Onset of symptoms was gradual starting 3 years ago with gradually worsening course since that time.The patient noted prior procedures of the hip to include arthroplasty on the right hip per Dr. Charlann Boxer in 2014.  Patient currently rates pain in the left hip at 9 out of 10 with activity. Patient has night pain, worsening of pain with activity and weight bearing, trendelenberg gait, pain that interfers with activities of daily living and pain with passive range of motion. Patient has evidence of periarticular osteophytes and joint space narrowing by imaging studies. This condition presents safety issues increasing the risk of falls. There is no current active infection.  Risks, benefits and expectations were discussed with the patient.  Risks including but not limited to the risk of anesthesia, blood clots, nerve damage, blood vessel damage, failure of the  prosthesis, infection and up to and including death.  Patient understand the risks, benefits and expectations and wishes to proceed with surgery.   PCP: Steele Sizer, MD   Discharged Condition: good  Hospital Course:  Patient underwent the above stated procedure on 06/04/2018. Patient tolerated the procedure well and brought to the recovery room in good condition and subsequently to the floor.  POD #1 BP: 127/87 ; Pulse: 98 ; Temp: 98.3 F (36.8 C) ; Resp: 18 Patient reports pain as mild, pain controlled.  No events throughout the night. Feels that this hip is more painful than the last, but still doing well.  Looking forward to getting d/c'ed home. Discussed that her doctor has already given her her analgesic medications for use after surgery. Dorsiflexion/plantar flexion intact, incision: dressing C/D/I, no cellulitis present and compartment soft.   LABS  Basename    HGB     10.4  HCT     32.4    Discharge Exam: General appearance: alert, cooperative and no distress Extremities: Homans sign is negative, no sign of DVT, no edema, redness or tenderness in the calves or thighs and no ulcers, gangrene or trophic changes  Disposition: Home  with follow up in 2 weeks   Follow-up Information    Durene Romans, MD. Schedule an appointment as soon as possible for a visit in 2 weeks.   Specialty:  Orthopedic Surgery Contact information: [address] [place] [address] [place] [address] [phone]           Discharge Instructions    Call MD / Call 911  Complete by:  As directed    If you experience chest pain or shortness of breath, CALL 911 and be transported to the hospital emergency room.  If you develope a fever above 101 F, pus (white drainage) or increased drainage or redness at the wound, or calf pain, call your surgeon's office.   Change dressing   Complete by:  As directed    Maintain surgical dressing until follow up in the clinic. If the edges start to pull up,  may reinforce with tape. If the dressing is no longer working, may remove and cover with gauze and tape, but must keep the area dry and clean.  Call with any questions or concerns.   Constipation Prevention   Complete by:  As directed    Drink plenty of fluids.  Prune juice may be helpful.  You may use a stool softener, such as Colace (over the counter) 100 mg twice a day.  Use MiraLax (over the counter) for constipation as needed.   Diet - low sodium heart healthy   Complete by:  As directed    Discharge instructions   Complete by:  As directed    Maintain surgical dressing until follow up in the clinic. If the edges start to pull up, may reinforce with tape. If the dressing is no longer working, may remove and cover with gauze and tape, but must keep the area dry and clean.  Follow up in 2 weeks at [facility]. Call with any questions or concerns.   Increase activity slowly as tolerated   Complete by:  As directed    Weight bearing as tolerated with assist device (walker, cane, etc) as directed, use it as long as suggested by your surgeon or therapist, typically at least 4-6 weeks.   TED hose   Complete by:  As directed    Use stockings (TED hose) for 2 weeks on both leg(s).  You may remove them at night for sleeping.      Allergies as of 06/05/2018   No Known Allergies     Medication List    STOP taking these medications   celecoxib 200 MG capsule Commonly known as:  CELEBREX   cyclobenzaprine 10 MG tablet Commonly known as:  FLEXERIL   hydrOXYzine 25 MG tablet Commonly known as:  ATARAX/VISTARIL   traMADol 50 MG tablet Commonly known as:  ULTRAM   zolpidem 5 MG tablet Commonly known as:  AMBIEN     TAKE these medications   amitriptyline 50 MG tablet Commonly known as:  ELAVIL TAKE 1/2 TO 1 (ONE-HALF TO ONE) TABLET BY MOUTH ONCE DAILY AT BEDTIME What changed:    how much to take  how to take this  when to take this  reasons to take  this  additional instructions   amLODipine-olmesartan 5-40 MG tablet Commonly known as:  AZOR Take 1 tablet by mouth daily.   aspirin 81 MG chewable tablet Chew 1 tablet (81 mg total) by mouth 2 (two) times daily. Take for 4 weeks, then resume regular dose.   docusate sodium 100 MG capsule Commonly known as:  COLACE Take 1 capsule (100 mg total) by mouth 2 (two) times daily.   ferrous sulfate 325 (65 FE) MG tablet Take 1 tablet (325 mg total) by mouth 3 (three) times daily with meals.   LUMIFY 0.025 % Soln Generic drug:  Brimonidine Tartrate Place 1 drop into both eyes daily as needed (for red eyes).   methocarbamol 500 MG tablet Commonly  known as:  ROBAXIN Take 1 tablet (500 mg total) by mouth every 6 (six) hours as needed for muscle spasms.   oxyCODONE 5 MG immediate release tablet Commonly known as:  Oxy IR/ROXICODONE Take 1-2 tablets (5-10 mg total) by mouth every 4 (four) hours as needed for moderate pain or severe pain. Patietn has already received her meds from her pain medicine doctor.   polyethylene glycol packet Commonly known as:  MIRALAX / GLYCOLAX Take 17 g by mouth 2 (two) times daily.            Discharge Care Instructions  (From admission, onward)         Start     Ordered   06/05/18 0000  Change dressing    Comments:  Maintain surgical dressing until follow up in the clinic. If the edges start to pull up, may reinforce with tape. If the dressing is no longer working, may remove and cover with gauze and tape, but must keep the area dry and clean.  Call with any questions or concerns.   06/05/18 [id number]           Signed: Anastasio AuerbachMatthew S. Delina Kruczek   PA-C  06/05/2018, 12:54 PM

## 2018-06-11 DIAGNOSIS — E042 Nontoxic multinodular goiter: Secondary | ICD-10-CM | POA: Insufficient documentation

## 2019-02-14 ENCOUNTER — Encounter: Payer: Self-pay | Admitting: Nurse Practitioner

## 2019-02-14 ENCOUNTER — Other Ambulatory Visit: Payer: Self-pay

## 2019-02-14 ENCOUNTER — Ambulatory Visit (INDEPENDENT_AMBULATORY_CARE_PROVIDER_SITE_OTHER): Payer: BC Managed Care – PPO | Admitting: Nurse Practitioner

## 2019-02-14 VITALS — BP 137/91 | HR 85 | Temp 98.4°F

## 2019-02-14 DIAGNOSIS — I83813 Varicose veins of bilateral lower extremities with pain: Secondary | ICD-10-CM | POA: Insufficient documentation

## 2019-02-14 DIAGNOSIS — G4709 Other insomnia: Secondary | ICD-10-CM | POA: Diagnosis not present

## 2019-02-14 MED ORDER — HYDROXYZINE HCL 10 MG PO TABS: 10.0000 mg | ORAL_TABLET | Freq: Every evening | ORAL | 1 refills | Status: DC | PRN

## 2019-02-14 NOTE — Assessment & Plan Note (Signed)
Reports pain present.  Is taking Celebrex and uses compression hose.  Referral to vascular per patient request (she reports dermatology told her to ask for referral).

## 2019-02-14 NOTE — Progress Notes (Signed)
BP (!) 137/91   Pulse 85   Temp 98.4 F (36.9 C) (Oral)   SpO2 98%    Subjective:    Patient ID: Carolyn Wood, female    DOB: 10/21/1960, 58 y.o.   MRN: [id number]  HPI: Carolyn Wood is a 58 y.o. female  Chief Complaint  Patient presents with  . Referral    pt states she needs a referral to a vein specialist   . Medication Refill    pt states she would like an rx for hydroxizine   REFERRAL NEED: Would like referral to vascular for varicose veins.  Has had off and on her whole life.  Has had therapy multiple times.  [facility] and they have requested she see vascular to do imaging and possible therapy.  Does endorse discomfort to both lower legs and feet.  Denies recent wounds or easy bruising.  Is taking Magnesium at night.  Does wear TED hose and takes Tylenol at home + Celebrex.    MEDICATION REFILL: Would like refill on Hydroxyzine at night.  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  Has been prescribed by PCP in past.  She states this medication helps her sleep at night.  Relevant past medical, surgical, family and social history reviewed and updated as indicated. Interim medical history since our last visit reviewed. Allergies and medications reviewed and updated.  Review of Systems  Constitutional: Negative for activity change, appetite change, diaphoresis, fatigue and fever.  Respiratory: Negative for cough, chest tightness and shortness of breath.   Cardiovascular: Negative for chest pain, palpitations and leg swelling.  Gastrointestinal: Negative for abdominal distention, abdominal pain, constipation, diarrhea, nausea and vomiting.  Neurological: Negative for dizziness, syncope, weakness, light-headedness, numbness and headaches.  Psychiatric/Behavioral: Positive for sleep disturbance.    Per HPI unless specifically indicated above     Objective:    BP (!) 137/91   Pulse 85   Temp 98.4 F (36.9  C) (Oral)   SpO2 98%   Wt Readings from Last 3 Encounters:  06/04/18 173 lb (78.5 kg)  05/30/18 173 lb (78.5 kg)  04/29/18 170 lb (77.1 kg)    Physical Exam Vitals signs and nursing note reviewed.  Constitutional:      General: She is awake. She is not in acute distress.    Appearance: She is well-developed. She is not ill-appearing.  HENT:     Head: Normocephalic.     Right Ear: Hearing normal.     Left Ear: Hearing normal.  Eyes:     General: Lids are normal.        Right eye: No discharge.        Left eye: No discharge.     Conjunctiva/sclera: Conjunctivae normal.     Pupils: Pupils are equal, round, and reactive to light.  Neck:     Musculoskeletal: Normal range of motion and neck supple.     Vascular: No carotid bruit.  Cardiovascular:     Rate and Rhythm: Normal rate and regular rhythm.     Pulses:          Popliteal pulses are 2+ on the right side and 2+ on the left side.       Dorsalis pedis pulses are 2+ on the right side and 2+ on the left side.       Posterior tibial pulses are 2+ on the right side and 2+ on the left side.     Heart  sounds: Normal heart sounds. No murmur. No gallop.   Pulmonary:     Effort: Pulmonary effort is normal. No accessory muscle usage or respiratory distress.     Breath sounds: Normal breath sounds.  Abdominal:     General: Bowel sounds are normal.     Palpations: Abdomen is soft.  Musculoskeletal:     Right lower leg: No edema.     Left lower leg: No edema.  Skin:    General: Skin is warm and dry.     Comments: Bilateral lower extremities with scattered varicosities below knee to anterior and posterior calf and ankles.  No edema or erythema.  Neurological:     Mental Status: She is alert and oriented to person, place, and time.  Psychiatric:        Attention and Perception: Attention normal.        Mood and Affect: Mood normal.        Behavior: Behavior normal. Behavior is cooperative.        Thought Content: Thought content  normal.        Judgment: Judgment normal.     Results for orders placed or performed during the hospital encounter of 06/04/18  CBC  Result Value Ref Range   WBC 13.3 (H) 4.0 - 10.5 K/uL   RBC 3.44 (L) 3.87 - 5.11 MIL/uL   Hemoglobin 10.4 (L) 12.0 - 15.0 g/dL   HCT 32.4 (L) 36.0 - 46.0 %   MCV 94.2 80.0 - 100.0 fL   MCH 30.2 26.0 - 34.0 pg   MCHC 32.1 30.0 - 36.0 g/dL   RDW 12.7 11.5 - 15.5 %   Platelets 306 150 - 400 K/uL   nRBC 0.0 0.0 - 0.2 %  Basic metabolic panel  Result Value Ref Range   Sodium 136 135 - 145 mmol/L   Potassium 4.0 3.5 - 5.1 mmol/L   Chloride 104 98 - 111 mmol/L   CO2 24 22 - 32 mmol/L   Glucose, Bld 139 (H) 70 - 99 mg/dL   BUN 10 6 - 20 mg/dL   Creatinine, Ser 0.50 0.44 - 1.00 mg/dL   Calcium 8.3 (L) 8.9 - 10.3 mg/dL   GFR calc non Af Amer >60 >60 mL/min   GFR calc Af Amer >60 >60 mL/min   Anion gap 8 5 - 15      Assessment & Plan:   Problem List Items Addressed This Visit      Cardiovascular and Mediastinum   Varicose veins of leg with pain, bilateral - Primary    Reports pain present.  Is taking Celebrex and uses compression hose.  Referral to vascular per patient request (she reports dermatology told her to ask for referral).      Relevant Orders   Ambulatory referral to Vascular Surgery     Other   Insomnia    Refill on Vistaril sent to pharmacy.  Educated on sleep hygiene techniques.          Follow up plan: Return in about 2 months (around 04/16/2019) for Annual Physical.

## 2019-02-14 NOTE — Assessment & Plan Note (Signed)
Refill on Vistaril sent to pharmacy.  Educated on sleep hygiene techniques.

## 2019-02-14 NOTE — Patient Instructions (Signed)
Varicose Veins Varicose veins are veins that have become enlarged, bulged, and twisted. They most often appear in the legs. What are the causes? This condition is caused by damage to the valves in the vein. These valves help blood return to your heart. When they are damaged and they stop working properly, blood may flow backward and back up in the veins near the skin, causing the veins to get larger and appear twisted. The condition can result from any issue that causes blood to back up, like pregnancy, prolonged standing, or obesity. What increases the risk? This condition is more likely to develop in people who are:  On their feet a lot.  Pregnant.  Overweight. What are the signs or symptoms? Symptoms of this condition include:  Bulging, twisted, and bluish veins.  A feeling of heaviness. This may be worse at the end of the day.  Leg pain. This may be worse at the end of the day.  Swelling in the leg.  Changes in skin color over the veins. How is this diagnosed? This condition may be diagnosed based on your symptoms, a physical exam, and an ultrasound test. How is this treated? Treatment for this condition may involve:  Avoiding sitting or standing in one position for long periods of time.  Wearing compression stockings. These stockings help to prevent blood clots and reduce swelling in the legs.  Raising (elevating) the legs when resting.  Losing weight.  Exercising regularly. If you have persistent symptoms or want to improve the way your varicose veins look, you may choose to have a procedure to close the varicose veins off or to remove them. Treatments to close off the veins include:  Sclerotherapy. In this treatment, a solution is injected into a vein to close it off.  Laser treatment. In this treatment, the vein is heated with a laser to close it off.  Radiofrequency vein ablation. In this treatment, an electrical current produced by radio waves is used to close  off the vein. Treatments to remove the veins include:  Phlebectomy. In this treatment, the veins are removed through small incisions made over the veins.  Vein ligation and stripping. In this treatment, incisions are made over the veins. The veins are then removed after being tied (ligated) with stitches (sutures). Follow these instructions at home: Activity  Walk as much as possible. Walking increases blood flow. This helps blood return to the heart and takes pressure off your veins. It also increases your cardiovascular strength.  Follow your health care provider's instructions about exercising.  Do not stand or sit in one position for a long period of time.  Do not sit with your legs crossed.  Rest with your legs raised during the day. General instructions   Follow any diet instructions given to you by your health care provider.  Wear compression stockings as directed by your health care provider. Do not wear other kinds of tight clothing around your legs, pelvis, or waist.  Elevate your legs at night to above the level of your heart.  If you get a cut in the skin over the varicose vein and the vein bleeds: ? Lie down with your leg raised. ? Apply firm pressure to the cut with a clean cloth until the bleeding stops. ? Place a bandage (dressing) on the cut. Contact a health care provider if:  The skin around your varicose veins starts to break down.  You have pain, redness, tenderness, or hard swelling over a vein.  You   are uncomfortable because of pain.  You get a cut in the skin over a varicose vein and it will not stop bleeding. Summary  Varicose veins are veins that have become enlarged, bulged, and twisted. They most often appear in the legs.  This condition is caused by damage to the valves in the vein. These valves help blood return to your heart.  Treatment for this condition includes frequent movements, wearing compression stockings, losing weight, and  exercising regularly. In some cases, procedures are done to close off or remove the veins.  Treatment for this condition may include wearing compression stockings, elevating the legs, losing weight, and engaging in regular activity. In some cases, procedures are done to close off or remove the veins. This information is not intended to replace advice given to you by your health care provider. Make sure you discuss any questions you have with your health care provider. Document Released: 03/22/2005 Document Revised: 08/08/2018 Document Reviewed: 07/05/2016 Elsevier Patient Education  2020 [employer]  

## 2019-03-05 ENCOUNTER — Other Ambulatory Visit (INDEPENDENT_AMBULATORY_CARE_PROVIDER_SITE_OTHER): Payer: Self-pay | Admitting: Nurse Practitioner

## 2019-03-05 ENCOUNTER — Encounter (INDEPENDENT_AMBULATORY_CARE_PROVIDER_SITE_OTHER): Payer: Self-pay | Admitting: Nurse Practitioner

## 2019-03-05 ENCOUNTER — Other Ambulatory Visit: Payer: Self-pay

## 2019-03-05 ENCOUNTER — Ambulatory Visit (INDEPENDENT_AMBULATORY_CARE_PROVIDER_SITE_OTHER): Payer: BC Managed Care – PPO

## 2019-03-05 ENCOUNTER — Ambulatory Visit (INDEPENDENT_AMBULATORY_CARE_PROVIDER_SITE_OTHER): Payer: BC Managed Care – PPO | Admitting: Nurse Practitioner

## 2019-03-05 VITALS — BP 138/94 | HR 89 | Resp 16 | Ht 68.0 in | Wt 179.0 lb

## 2019-03-05 DIAGNOSIS — I10 Essential (primary) hypertension: Secondary | ICD-10-CM

## 2019-03-05 DIAGNOSIS — I83813 Varicose veins of bilateral lower extremities with pain: Secondary | ICD-10-CM | POA: Diagnosis not present

## 2019-03-05 NOTE — Progress Notes (Signed)
SUBJECTIVE:  Patient ID: Carolyn Wood, female    DOB: 04/16/1961, 58 y.o.   MRN: [id number] Chief Complaint  Patient presents with  . New Patient (Initial Visit)    ref Crissmon for varicose veins    HPI  Carolyn Wood is a 58 y.o. female that is referred by Dr. Christell Faithrissman's office for concern about varicose veins.  The patient has issues with cramping during the evening while she is sleeping.  She also endorses having some cramps when moving from a sitting to standing position.  The patient also endorses having some heaviness in her legs.  She denies any claudication-like symptoms such as cramping while walking that goes away when she rests.  She denies any rest pain like symptoms.  She does not have any lower extremity wounds or ulcerations.  The patient was previously having her varicose veins treated by dermatology however there was concern that there may be an issue with the saphenous veins so she was also suggested to have her vascular status checked here as well.  The patient does wear medical grade 1 compression stockings on a daily basis however she states she feels it did not help.  Today the patient has no evidence of DVT or superficial venous thrombosis bilaterally.  There is no evidence of chronic venous insufficiency bilaterally.  Past Medical History:  Diagnosis Date  . Anxiety   . Arthritis   . Complication of anesthesia    combative when waking up  . Elevated liver enzymes   . History of Papanicolaou smear of cervix 11/05/2013   RNIL/NEG  . HTN (hypertension)   . Seasonal allergies     Past Surgical History:  Procedure Laterality Date  . ABDOMINAL HYSTERECTOMY    . AUGMENTATION MAMMAPLASTY Bilateral 2015   silicone  . BUNIONECTOMY Bilateral   . CESAREAN SECTION    . chin implant    . cyst removed     tail bone  . JOINT REPLACEMENT Right    hip  . KNEE ARTHROSCOPY Right 1989   "dont know what they did"  . LIPOSUCTION     on neck and chin  .  NASAL SEPTUM SURGERY Right 07/2016  . TONSILLECTOMY    . 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 TOTAL HIP ARTHROPLASTY Left 06/04/2018   Procedure: LEFT TOTAL HIP ARTHROPLASTY ANTERIOR APPROACH;  Surgeon: Durene Romanslin, Matthew, MD;  Location: [facility];  Service: Orthopedics;  Laterality: Left;  70 mins    Social History   Socioeconomic History  . Marital status: Single    Spouse name: Not on file  . Number of children: Not on file  . Years of education: Not on file  . Highest education level: Not on file  Occupational History  . Not on file  Social Needs  . Financial resource strain: Not on file  . Food insecurity    Worry: Not on file    Inability: Not on file  . Transportation needs    Medical: Not on file    Non-medical: Not on file  Tobacco Use  . Smoking status: Former Smoker    Packs/day: 0.50    Years: 20.00    Pack years: 10.00    Types: Cigarettes, Cigars  . Smokeless tobacco: Never Used  Substance and Sexual Activity  . Alcohol use: Yes  . Drug use: No  .  Sexual activity: Yes  Lifestyle  . Physical activity    Days per week: Not on file    Minutes per session: Not on file  . Stress: Not on file  Relationships  . Social Musician on phone: Not on file    Gets together: Not on file    Attends religious service: Not on file    Active member of club or organization: Not on file    Attends meetings of clubs or organizations: Not on file    Relationship status: Not on file  . Intimate partner violence    Fear of current or ex partner: Not on file    Emotionally abused: Not on file    Physically abused: Not on file    Forced sexual activity: Not on file  Other Topics Concern  . Not on file  Social History Narrative  . Not on file    Family History  Problem Relation Age of Onset  . Cancer Mother         kidney  . Diabetes Mother   . Hypertension Mother   . Breast cancer Neg Hx   . Ovarian cancer Neg Hx     No Known Allergies   Review of Systems   Review of Systems: Negative Unless Checked Constitutional: [] Weight loss  [] Fever  [] Chills Cardiac: [] Chest pain   []  Atrial Fibrillation  [] Palpitations   [] Shortness of breath when laying flat   [] Shortness of breath with exertion. [] Shortness of breath at rest Vascular:  [] Pain in legs with walking   [] Pain in legs with standing [] Pain in legs when laying flat   [] Claudication    [] Pain in feet when laying flat    [] History of DVT   [] Phlebitis   [] Swelling in legs   [x] Varicose veins   [] Non-healing ulcers Pulmonary:   [] Uses home oxygen   [] Productive cough   [] Hemoptysis   [] Wheeze  [] COPD   [] Asthma Neurologic:  [] Dizziness   [] Seizures  [] Blackouts [] History of stroke   [] History of TIA  [] Aphasia   [] Temporary Blindness   [] Weakness or numbness in arm   [] Weakness or numbness in leg Musculoskeletal:   [] Joint swelling   [] Joint pain   [] Low back pain  []  History of Knee Replacement [x] Arthritis [] back Surgeries  []  Spinal Stenosis    Hematologic:  [] Easy bruising  [] Easy bleeding   [] Hypercoagulable state   [] Anemic Gastrointestinal:  [] Diarrhea   [] Vomiting  [] Gastroesophageal reflux/heartburn   [] Difficulty swallowing. [] Abdominal pain Genitourinary:  [] Chronic kidney disease   [] Difficult urination  [] Anuric   [] Blood in urine [] Frequent urination  [] Burning with urination   [] Hematuria Skin:  [] Rashes   [] Ulcers [] Wounds Psychological:  [x] History of anxiety   [x]  History of major depression  []  Memory Difficulties      OBJECTIVE:   Physical Exam  BP (!) 138/94 (BP Location: Right Arm)   Pulse 89   Resp 16   Ht 5\' 8"  (1.727 m)   Wt 179 lb (81.2 kg)   BMI 27.22 kg/m   Gen: WD/WN, NAD Head: [place]/AT, No temporalis wasting.  Ear/Nose/Throat: Hearing grossly intact, nares w/o erythema or drainage Eyes: PER, EOMI, sclera  nonicteric.  Neck: Supple, no masses.  No JVD.  Pulmonary:  Good air movement, no use of accessory muscles.  Cardiac: RRR Vascular:  Scattered spider veins bilaterally, trace edema bilaterally Vessel Right Left  Radial Palpable Palpable  Dorsalis Pedis Palpable Palpable  Posterior Tibial Palpable Palpable  Gastrointestinal: soft, non-distended. No guarding/no peritoneal signs.  Musculoskeletal: M/S 5/5 throughout.  No deformity or atrophy.  Neurologic: Pain and light touch intact in extremities.  Symmetrical.  Speech is fluent. Motor exam as listed above. Psychiatric: Judgment intact, Mood & affect appropriate for pt's clinical situation. Dermatologic: No Venous rashes. No Ulcers Noted.  No changes consistent with cellulitis. Lymph : No Cervical lymphadenopathy, no lichenification or skin changes of chronic lymphedema.       ASSESSMENT AND PLAN:  1. Varicose veins of leg with pain, bilateral Based on noninvasive studies the patient has varicose veins that are superficial in nature.  Based upon the fact that the patient has no evidence of reflux bilaterally we are unable to perform any procedures for the patient as they be considered cosmetic.  I discussed numerous current relief methods with the patient to try to utilize.  I also suggested speaking with her primary care physician about her restless legs which could contribute to the cramping.  Patient will follow-up on an as-needed basis.  2. Essential hypertension Continue antihypertensive medications as already ordered, these medications have been reviewed and there are no changes at this time.    Current Outpatient Medications on File Prior to Visit  Medication Sig Dispense Refill  . amitriptyline (ELAVIL) 50 MG tablet TAKE 1/2 TO 1 (ONE-HALF TO ONE) TABLET BY MOUTH ONCE DAILY AT BEDTIME (Patient taking differently: Take 25-50 mg by mouth at bedtime as needed for sleep. ) 90 tablet 3  . amLODipine-olmesartan (AZOR) 5-40 MG tablet  Take 1 tablet by mouth daily. 90 tablet 4  . Brimonidine Tartrate (LUMIFY) 0.025 % SOLN Place 1 drop into both eyes daily as needed (for red eyes).    . celecoxib (CELEBREX) 200 MG capsule Take 100 mg by mouth daily.     . hydrOXYzine (ATARAX/VISTARIL) 10 MG tablet Take 1 tablet (10 mg total) by mouth at bedtime as needed. 60 tablet 1  . MAGNESIUM PO 1 tablet daily.    . traMADol (ULTRAM) 50 MG tablet Take by mouth every 6 (six) hours as needed.    . docusate sodium (COLACE) 100 MG capsule Take 1 capsule (100 mg total) by mouth 2 (two) times daily. (Patient not taking: Reported on 02/14/2019) 10 capsule 0  . ferrous sulfate (FERROUSUL) 325 (65 FE) MG tablet Take 1 tablet (325 mg total) by mouth 3 (three) times daily with meals. (Patient not taking: Reported on 02/14/2019)  3  . methocarbamol (ROBAXIN) 500 MG tablet Take 1 tablet (500 mg total) by mouth every 6 (six) hours as needed for muscle spasms. (Patient not taking: Reported on 02/14/2019) 40 tablet 0   No current facility-administered medications on file prior to visit.     There are no Patient Instructions on file for this visit. No follow-ups on file.   Kris Hartmann, NP  This note was completed with Sales executive.  Any errors are purely unintentional.

## 2019-05-26 HISTORY — PX: NECK SURGERY: SHX720

## 2019-06-05 DIAGNOSIS — M5030 Other cervical disc degeneration, unspecified cervical region: Secondary | ICD-10-CM | POA: Insufficient documentation

## 2019-07-17 ENCOUNTER — Encounter: Payer: Self-pay | Admitting: Nurse Practitioner

## 2019-07-17 ENCOUNTER — Ambulatory Visit (INDEPENDENT_AMBULATORY_CARE_PROVIDER_SITE_OTHER): Payer: BC Managed Care – PPO | Admitting: Nurse Practitioner

## 2019-07-17 DIAGNOSIS — I10 Essential (primary) hypertension: Secondary | ICD-10-CM

## 2019-07-17 MED ORDER — AMLODIPINE-OLMESARTAN 5-40 MG PO TABS: 1.0000 | ORAL_TABLET | Freq: Every day | ORAL | 3 refills | Status: DC

## 2019-07-17 NOTE — Patient Instructions (Signed)
DASH Eating Plan DASH stands for "Dietary Approaches to Stop Hypertension." The DASH eating plan is a healthy eating plan that has been shown to reduce high blood pressure (hypertension). It may also reduce your risk for type 2 diabetes, heart disease, and stroke. The DASH eating plan may also help with weight loss. What are tips for following this plan?  General guidelines  Avoid eating more than 2,300 mg (milligrams) of salt (sodium) a day. If you have hypertension, you may need to reduce your sodium intake to 1,500 mg a day.  Limit alcohol intake to no more than 1 drink a day for nonpregnant women and 2 drinks a day for men. One drink equals 12 oz of beer, 5 oz of wine, or 1 oz of hard liquor.  Work with your health care provider to maintain a healthy body weight or to lose weight. Ask what an ideal weight is for you.  Get at least 30 minutes of exercise that causes your heart to beat faster (aerobic exercise) most days of the week. Activities may include walking, swimming, or biking.  Work with your health care provider or diet and nutrition specialist (dietitian) to adjust your eating plan to your individual calorie needs. Reading food labels   Check food labels for the amount of sodium per serving. Choose foods with less than 5 percent of the Daily Value of sodium. Generally, foods with less than 300 mg of sodium per serving fit into this eating plan.  To find whole grains, look for the word "whole" as the first word in the ingredient list. Shopping  Buy products labeled as "low-sodium" or "no salt added."  Buy fresh foods. Avoid canned foods and premade or frozen meals. Cooking  Avoid adding salt when cooking. Use salt-free seasonings or herbs instead of table salt or sea salt. Check with your health care provider or pharmacist before using salt substitutes.  Do not fry foods. Cook foods using healthy methods such as baking, boiling, grilling, and broiling instead.  Cook with  heart-healthy oils, such as olive, canola, soybean, or sunflower oil. Meal planning  Eat a balanced diet that includes: ? 5 or more servings of fruits and vegetables each day. At each meal, try to fill half of your plate with fruits and vegetables. ? Up to 6-8 servings of whole grains each day. ? Less than 6 oz of lean meat, poultry, or fish each day. A 3-oz serving of meat is about the same size as a deck of cards. One egg equals 1 oz. ? 2 servings of low-fat dairy each day. ? A serving of nuts, seeds, or beans 5 times each week. ? Heart-healthy fats. Healthy fats called Omega-3 fatty acids are found in foods such as flaxseeds and coldwater fish, like sardines, salmon, and mackerel.  Limit how much you eat of the following: ? Canned or prepackaged foods. ? Food that is high in trans fat, such as fried foods. ? Food that is high in saturated fat, such as fatty meat. ? Sweets, desserts, sugary drinks, and other foods with added sugar. ? Full-fat dairy products.  Do not salt foods before eating.  Try to eat at least 2 vegetarian meals each week.  Eat more home-cooked food and less restaurant, buffet, and fast food.  When eating at a restaurant, ask that your food be prepared with less salt or no salt, if possible. What foods are recommended? The items listed may not be a complete list. Talk with your dietitian about   what dietary choices are best for you. Grains Whole-grain or whole-wheat bread. Whole-grain or whole-wheat pasta. Brown rice. Oatmeal. Quinoa. Bulgur. Whole-grain and low-sodium cereals. Pita bread. Low-fat, low-sodium crackers. Whole-wheat flour tortillas. Vegetables Fresh or frozen vegetables (raw, steamed, roasted, or grilled). Low-sodium or reduced-sodium tomato and vegetable juice. Low-sodium or reduced-sodium tomato sauce and tomato paste. Low-sodium or reduced-sodium canned vegetables. Fruits All fresh, dried, or frozen fruit. Canned fruit in natural juice (without  added sugar). Meat and other protein foods Skinless chicken or turkey. Ground chicken or turkey. Pork with fat trimmed off. Fish and seafood. Egg whites. Dried beans, peas, or lentils. Unsalted nuts, nut butters, and seeds. Unsalted canned beans. Lean cuts of beef with fat trimmed off. Low-sodium, lean deli meat. Dairy Low-fat (1%) or fat-free (skim) milk. Fat-free, low-fat, or reduced-fat cheeses. Nonfat, low-sodium ricotta or cottage cheese. Low-fat or nonfat yogurt. Low-fat, low-sodium cheese. Fats and oils Soft margarine without trans fats. Vegetable oil. Low-fat, reduced-fat, or light mayonnaise and salad dressings (reduced-sodium). Canola, safflower, olive, soybean, and sunflower oils. Avocado. Seasoning and other foods Herbs. Spices. Seasoning mixes without salt. Unsalted popcorn and pretzels. Fat-free sweets. What foods are not recommended? The items listed may not be a complete list. Talk with your dietitian about what dietary choices are best for you. Grains Baked goods made with fat, such as croissants, muffins, or some breads. Dry pasta or rice meal packs. Vegetables Creamed or fried vegetables. Vegetables in a cheese sauce. Regular canned vegetables (not low-sodium or reduced-sodium). Regular canned tomato sauce and paste (not low-sodium or reduced-sodium). Regular tomato and vegetable juice (not low-sodium or reduced-sodium). Pickles. Olives. Fruits Canned fruit in a light or heavy syrup. Fried fruit. Fruit in cream or butter sauce. Meat and other protein foods Fatty cuts of meat. Ribs. Fried meat. Bacon. Sausage. Bologna and other processed lunch meats. Salami. Fatback. Hotdogs. Bratwurst. Salted nuts and seeds. Canned beans with added salt. Canned or smoked fish. Whole eggs or egg yolks. Chicken or turkey with skin. Dairy Whole or 2% milk, cream, and half-and-half. Whole or full-fat cream cheese. Whole-fat or sweetened yogurt. Full-fat cheese. Nondairy creamers. Whipped toppings.  Processed cheese and cheese spreads. Fats and oils Butter. Stick margarine. Lard. Shortening. Ghee. Bacon fat. Tropical oils, such as coconut, palm kernel, or palm oil. Seasoning and other foods Salted popcorn and pretzels. Onion salt, garlic salt, seasoned salt, table salt, and sea salt. Worcestershire sauce. Tartar sauce. Barbecue sauce. Teriyaki sauce. Soy sauce, including reduced-sodium. Steak sauce. Canned and packaged gravies. Fish sauce. Oyster sauce. Cocktail sauce. Horseradish that you find on the shelf. Ketchup. Mustard. Meat flavorings and tenderizers. Bouillon cubes. Hot sauce and Tabasco sauce. Premade or packaged marinades. Premade or packaged taco seasonings. Relishes. Regular salad dressings. Where to find more information:  National Heart, Lung, and Blood Institute: www.nhlbi.nih.gov  American Heart Association: www.heart.org Summary  The DASH eating plan is a healthy eating plan that has been shown to reduce high blood pressure (hypertension). It may also reduce your risk for type 2 diabetes, heart disease, and stroke.  With the DASH eating plan, you should limit salt (sodium) intake to 2,300 mg a day. If you have hypertension, you may need to reduce your sodium intake to 1,500 mg a day.  When on the DASH eating plan, aim to eat more fresh fruits and vegetables, whole grains, lean proteins, low-fat dairy, and heart-healthy fats.  Work with your health care provider or diet and nutrition specialist (dietitian) to adjust your eating plan to your   individual calorie needs. This information is not intended to replace advice given to you by your health care provider. Make sure you discuss any questions you have with your health care provider. Document Revised: 05/25/2017 Document Reviewed: 06/05/2016 Elsevier Patient Education  2020 [employer]  

## 2019-07-17 NOTE — Assessment & Plan Note (Signed)
Chronic, ongoing.  Continue current medication regimen and adjust as needed.  Refills sent.  Will have in office in upcoming weeks for physical and labs.  Recommend she check BP at home at least 3 mornings a week.  Return in 3-4 weeks for physical exam.

## 2019-07-17 NOTE — Progress Notes (Signed)
There were no vitals taken for this visit.   Subjective:    Patient ID: Carolyn Wood, female    DOB: Feb 27, 1961, 59 y.o.   MRN: [id number]  HPI: Carolyn Wood is a 59 y.o. female  Chief Complaint  Patient presents with  . Hypertension    . This visit was completed via telephone due to the restrictions of the COVID-19 pandemic. All issues as above were discussed and addressed but no physical exam was performed. If it was felt that the patient should be evaluated in the office, they were directed there. The patient verbally consented to this visit. Patient was unable to complete an audio/visual visit due to Lack of equipment. Due to the catastrophic nature of the COVID-19 pandemic, this visit was done through audio contact only. . Location of the patient: home . Location of the provider: home . Those involved with this call:  . Provider: Aura Dials, DNP . CMA: Wilhemena Durie, CMA . Front Desk/Registration: Adela Ports  . Time spent on call: 15 minutes on the phone discussing health concerns. 10 minutes total spent in review of patient's record and preparation of their chart.  . I verified patient identity using two factors (patient name and date of birth). Patient consents verbally to being seen via telemedicine visit today.    HYPERTENSION Currently taking Amlodipine-Olmesartan 5-40 MG.  Has missed doses for 3 days.  Has not been monitoring BP at home.  Has not had blood work since 2019.  She had recent neck surgery, but would like to come in for physical in upcoming weeks and labs. Hypertension status: stable  Satisfied with current treatment? yes Duration of hypertension: chronic BP monitoring frequency:  not checking BP range:  BP medication side effects:  no Medication compliance: good compliance Previous BP meds: Amlodipine-Olmesartan Aspirin: no Recurrent headaches: no Visual changes: no Palpitations: no Dyspnea: no Chest pain: no Lower  extremity edema: no Dizzy/lightheaded: no  Relevant past medical, surgical, family and social history reviewed and updated as indicated. Interim medical history since our last visit reviewed. Allergies and medications reviewed and updated.  Review of Systems  Constitutional: Negative for activity change, appetite change, diaphoresis, fatigue and fever.  Respiratory: Negative for cough, chest tightness and shortness of breath.   Cardiovascular: Negative for chest pain, palpitations and leg swelling.  Gastrointestinal: Negative.   Neurological: Negative.   Psychiatric/Behavioral: Negative.     Per HPI unless specifically indicated above     Objective:    There were no vitals taken for this visit.  Wt Readings from Last 3 Encounters:  03/05/19 179 lb (81.2 kg)  06/04/18 173 lb (78.5 kg)  05/30/18 173 lb (78.5 kg)    Physical Exam   Unable to perform due to telephone visit only.  Results for orders placed or performed during the hospital encounter of 06/04/18  CBC  Result Value Ref Range   WBC 13.3 (H) 4.0 - 10.5 K/uL   RBC 3.44 (L) 3.87 - 5.11 MIL/uL   Hemoglobin 10.4 (L) 12.0 - 15.0 g/dL   HCT 23.5 (L) 57.3 - 22.0 %   MCV 94.2 80.0 - 100.0 fL   MCH 30.2 26.0 - 34.0 pg   MCHC 32.1 30.0 - 36.0 g/dL   RDW 25.4 27.0 - 62.3 %   Platelets 306 150 - 400 K/uL   nRBC 0.0 0.0 - 0.2 %  Basic metabolic panel  Result Value Ref Range   Sodium 136 135 - 145 mmol/L   Potassium 4.0  3.5 - 5.1 mmol/L   Chloride 104 98 - 111 mmol/L   CO2 24 22 - 32 mmol/L   Glucose, Bld 139 (H) 70 - 99 mg/dL   BUN 10 6 - 20 mg/dL   Creatinine, Ser 0.50 0.44 - 1.00 mg/dL   Calcium 8.3 (L) 8.9 - 10.3 mg/dL   GFR calc non Af Amer >60 >60 mL/min   GFR calc Af Amer >60 >60 mL/min   Anion gap 8 5 - 15      Assessment & Plan:   Problem List Items Addressed This Visit      Cardiovascular and Mediastinum   Essential hypertension    Chronic, ongoing.  Continue current medication regimen and adjust as  needed.  Refills sent.  Will have in office in upcoming weeks for physical and labs.  Recommend she check BP at home at least 3 mornings a week.  Return in 3-4 weeks for physical exam.      Relevant Medications   amLODipine-olmesartan (AZOR) 5-40 MG tablet      I discussed the assessment and treatment plan with the patient. The patient was provided an opportunity to ask questions and all were answered. The patient agreed with the plan and demonstrated an understanding of the instructions.   The patient was advised to call back or seek an in-person evaluation if the symptoms worsen or if the condition fails to improve as anticipated.   I provided 15 minutes of time during this encounter.  Follow up plan: Return in about 4 weeks (around 08/14/2019) for Annual physical.

## 2019-08-13 ENCOUNTER — Encounter: Payer: Self-pay | Admitting: Nurse Practitioner

## 2019-08-13 ENCOUNTER — Ambulatory Visit (INDEPENDENT_AMBULATORY_CARE_PROVIDER_SITE_OTHER): Payer: BC Managed Care – PPO | Admitting: Nurse Practitioner

## 2019-08-13 ENCOUNTER — Other Ambulatory Visit: Payer: Self-pay

## 2019-08-13 VITALS — BP 137/82 | HR 97 | Temp 98.3°F | Ht 66.0 in | Wt 178.8 lb

## 2019-08-13 DIAGNOSIS — Z1231 Encounter for screening mammogram for malignant neoplasm of breast: Secondary | ICD-10-CM

## 2019-08-13 DIAGNOSIS — Z0000 Encounter for general adult medical examination without abnormal findings: Secondary | ICD-10-CM | POA: Diagnosis not present

## 2019-08-13 DIAGNOSIS — E042 Nontoxic multinodular goiter: Secondary | ICD-10-CM

## 2019-08-13 DIAGNOSIS — F419 Anxiety disorder, unspecified: Secondary | ICD-10-CM

## 2019-08-13 DIAGNOSIS — E559 Vitamin D deficiency, unspecified: Secondary | ICD-10-CM

## 2019-08-13 DIAGNOSIS — E7800 Pure hypercholesterolemia, unspecified: Secondary | ICD-10-CM | POA: Diagnosis not present

## 2019-08-13 DIAGNOSIS — L989 Disorder of the skin and subcutaneous tissue, unspecified: Secondary | ICD-10-CM

## 2019-08-13 DIAGNOSIS — I10 Essential (primary) hypertension: Secondary | ICD-10-CM | POA: Diagnosis not present

## 2019-08-13 DIAGNOSIS — G4709 Other insomnia: Secondary | ICD-10-CM

## 2019-08-13 DIAGNOSIS — E663 Overweight: Secondary | ICD-10-CM

## 2019-08-13 MED ORDER — HYDROXYZINE HCL 10 MG PO TABS: 10.0000 mg | ORAL_TABLET | Freq: Every evening | ORAL | 1 refills | Status: DC | PRN

## 2019-08-13 MED ORDER — AMLODIPINE-OLMESARTAN 5-40 MG PO TABS: 1.0000 | ORAL_TABLET | Freq: Every day | ORAL | 3 refills | Status: DC

## 2019-08-13 MED ORDER — AMITRIPTYLINE HCL 50 MG PO TABS: 25.0000 mg | ORAL_TABLET | Freq: Every evening | ORAL | 3 refills | Status: DC | PRN

## 2019-08-13 NOTE — Assessment & Plan Note (Signed)
Check TSH today

## 2019-08-13 NOTE — Assessment & Plan Note (Signed)
Chronic, ongoing with BP at goal.  Continue current medication regimen and adjust as needed.   Recommend she check BP at home at least 3 mornings a week.  Labs today.  Return in 6 months. 

## 2019-08-13 NOTE — Assessment & Plan Note (Signed)
Noted on past labs, ASCVD 3.3%.  No current statin, continue diet focus and check lipid panel today.  Initiate medication as needed.

## 2019-08-13 NOTE — Assessment & Plan Note (Signed)
Recommend continued focus on healthy diet choices and regular physical activity (30 minutes 5 days a week).  

## 2019-08-13 NOTE — Progress Notes (Signed)
BP 137/82 (BP Location: Left Arm, Patient Position: Sitting, Cuff Size: Normal)   Pulse 97   Temp 98.3 F (36.8 C) (Oral)   Ht 5\' 6"  (1.676 m)   Wt 178 lb 12.8 oz (81.1 kg)   SpO2 95%   BMI 28.86 kg/m    Subjective:    Patient ID: , female    DOB: 1960/10/29, 59 y.o.   MRN: [id number]  HPI: Carolyn Wood is a 59 y.o. female presenting on 08/13/2019 for comprehensive medical examination. Current medical complaints include:none  She currently lives with: self Menopausal Symptoms: no   HYPERTENSION / HYPERLIPIDEMIA Continues on Azor 5-40 MG, no current statin.  LDL last 161 and TCHOL in 08/15/2019 in 2019. Satisfied with current treatment? yes Duration of hypertension: chronic BP monitoring frequency: not checking BP range:  BP medication side effects: no Duration of hyperlipidemia: chronic Cholesterol medication side effects: no Cholesterol supplements: fish oil Medication compliance: good compliance Aspirin: no Recent stressors: no Recurrent headaches: no Visual changes: no Palpitations: no Dyspnea: no Chest pain: no Lower extremity edema: no Dizzy/lightheaded: no  The 10-year ASCVD risk score 2020 DC Jr., et al., 2013) is: 3.3%   Values used to calculate the score:     Age: 35 years     Sex: Female     Is Non-Hispanic African American: No     Diabetic: No     Tobacco smoker: No     Systolic Blood Pressure: 137 mmHg     Is BP treated: Yes     HDL Cholesterol: 74 mg/dL     Total Cholesterol: 258 mg/dL   ANXIETY/STRESS Continues on Amitriptyline 25-50 MG and Vistaril as needed.  Takes these for mood and insomnia.   Duration:stable Anxious mood: occasional Excessive worrying: no Irritability: no  Sweating: no Nausea: no Palpitations:no Hyperventilation: no Panic attacks: no Agoraphobia: no  Obscessions/compulsions: no Depressed mood: no Depression screen [facility] 2/9 08/13/2019 08/13/2019 07/17/2019 04/17/2018 02/11/2018  Decreased Interest  1 1 0 1 2  Down, Depressed, Hopeless 0 0 0 1 2  PHQ - 2 Score 1 1 0 2 4  Altered sleeping 1 1 0 3 2  Tired, decreased energy 1 1 0 1 2  Change in appetite 1 0 0 0 2  Feeling bad or failure about yourself  1 1 0 0 1  Trouble concentrating 0 0 0 0 1  Moving slowly or fidgety/restless 0 0 0 0 0  Suicidal thoughts 0 0 0 0 0  PHQ-9 Score 5 4 0 6 12  Difficult doing work/chores - Not difficult at all Not difficult at all - Somewhat difficult   Anhedonia: no Weight changes: no Insomnia: yes hard to fall asleep  Hypersomnia: no Fatigue/loss of energy: no Feelings of worthlessness: no Feelings of guilt: no Impaired concentration/indecisiveness: yes Suicidal ideations: no  Crying spells: no Recent Stressors/Life Changes: no   Relationship problems: no   Family stress: no     Financial stress: no    Job stress: no    Recent death/loss: no GAD 7 : Generalized Anxiety Score 08/13/2019 08/13/2019 02/11/2018  Nervous, Anxious, on Edge 1 1 3   Control/stop worrying 1 1 3   Worry too much - different things 1 1 1   Trouble relaxing 1 1 3   Restless 0 0 1  Easily annoyed or irritable 1 1 2   Afraid - awful might happen 1 1 0  Total GAD 7 Score 6 6 13   Anxiety Difficulty Somewhat difficult Somewhat difficult  Somewhat difficult    The patient does not have a history of falls. I did not complete a risk assessment for falls. A plan of care for falls was not documented.   Past Medical History:  Past Medical History:  Diagnosis Date  . Anxiety   . Arthritis   . Complication of anesthesia    combative when waking up  . Elevated liver enzymes   . History of Papanicolaou smear of cervix 11/05/2013   RNIL/NEG  . HTN (hypertension)   . Seasonal allergies     Surgical History:  Past Surgical History:  Procedure Laterality Date  . ABDOMINAL HYSTERECTOMY    . AUGMENTATION MAMMAPLASTY Bilateral 2015   silicone  . BUNIONECTOMY Bilateral   . CESAREAN SECTION    . chin implant    . cyst  removed     tail bone  . JOINT REPLACEMENT Right    hip  . KNEE ARTHROSCOPY Right 1989   "dont know what they did"  . LIPOSUCTION     on neck and chin  . NASAL SEPTUM SURGERY Right 07/2016  . NECK SURGERY  05/26/2019  . TONSILLECTOMY    . 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 TOTAL HIP ARTHROPLASTY Left 06/04/2018   Procedure: LEFT TOTAL HIP ARTHROPLASTY ANTERIOR APPROACH;  Surgeon: Durene Romans, MD;  Location: [facility];  Service: Orthopedics;  Laterality: Left;  70 mins    Medications:  Current Outpatient Medications on File Prior to Visit  Medication Sig  . Brimonidine Tartrate (LUMIFY) 0.025 % SOLN Place 1 drop into both eyes daily as needed (for red eyes).  . celecoxib (CELEBREX) 200 MG capsule Take 100 mg by mouth daily.   . meloxicam (MOBIC) 15 MG tablet Take 15 mg by mouth daily.  . ferrous sulfate (FERROUSUL) 325 (65 FE) MG tablet Take 1 tablet (325 mg total) by mouth 3 (three) times daily with meals. (Patient not taking: Reported on 08/13/2019)  . MAGNESIUM PO 1 tablet daily.  . methocarbamol (ROBAXIN) 500 MG tablet Take 1 tablet (500 mg total) by mouth every 6 (six) hours as needed for muscle spasms. (Patient not taking: Reported on 08/13/2019)   No current facility-administered medications on file prior to visit.    Allergies:  No Known Allergies  Social History:  Social History   Socioeconomic History  . Marital status: Single    Spouse name: Not on file  . Number of children: Not on file  . Years of education: Not on file  . Highest education level: Not on file  Occupational History  . Not on file  Tobacco Use  . Smoking status: Former Smoker    Packs/day: 0.50    Years: 20.00    Pack years: 10.00    Types: Cigarettes, Cigars  . Smokeless tobacco: Never Used  Substance and Sexual Activity  . Alcohol  use: Not Currently  . Drug use: No  . Sexual activity: Yes  Other Topics Concern  . Not on file  Social History Narrative  . Not on file   Social Determinants of Health   Financial Resource Strain:   . Difficulty of Paying Living Expenses: Not on file  Food Insecurity:   . Worried About Programme researcher, broadcasting/film/video in the Last Year: Not on file  . Ran Out of Food in the Last Year: Not on file  Transportation Needs:   . Freight forwarder (Medical): Not on file  . Lack of Transportation (Non-Medical): Not on file  Physical Activity:   . Days of Exercise per Week: Not on file  . Minutes of Exercise per Session: Not on file  Stress:   . Feeling of Stress : Not on file  Social Connections:   . Frequency of Communication with Friends and Family: Not on file  . Frequency of Social Gatherings with Friends and Family: Not on file  . Attends Religious Services: Not on file  . Active Member of Clubs or Organizations: Not on file  . Attends Banker Meetings: Not on file  . Marital Status: Not on file  Intimate Partner Violence:   . Fear of Current or Ex-Partner: Not on file  . Emotionally Abused: Not on file  . Physically Abused: Not on file  . Sexually Abused: Not on file   Social History   Tobacco Use  Smoking Status Former Smoker  . Packs/day: 0.50  . Years: 20.00  . Pack years: 10.00  . Types: Cigarettes, Cigars  Smokeless Tobacco Never Used   Social History   Substance and Sexual Activity  Alcohol Use Not Currently    Family History:  Family History  Problem Relation Age of Onset  . Cancer Mother        kidney  . Diabetes Mother   . Hypertension Mother   . Breast cancer Neg Hx   . Ovarian cancer Neg Hx     Past medical history, surgical history, medications, allergies, family history and social history reviewed with patient today and changes made to appropriate areas of the chart.   Review of Systems -  negative All other ROS negative except what is  listed above and in the HPI.      Objective:    BP 137/82 (BP Location: Left Arm, Patient Position: Sitting, Cuff Size: Normal)   Pulse 97   Temp 98.3 F (36.8 C) (Oral)   Ht 5\' 6"  (1.676 m)   Wt 178 lb 12.8 oz (81.1 kg)   SpO2 95%   BMI 28.86 kg/m   Wt Readings from Last 3 Encounters:  08/13/19 178 lb 12.8 oz (81.1 kg)  03/05/19 179 lb (81.2 kg)  06/04/18 173 lb (78.5 kg)    Physical Exam Constitutional:      General: She is awake. She is not in acute distress.    Appearance: She is well-developed. She is not ill-appearing.  HENT:     Head: Normocephalic and atraumatic.     Right Ear: Hearing, tympanic membrane, ear canal and external ear normal. No drainage.     Left Ear: Hearing, tympanic membrane, ear canal and external ear normal. No drainage.     Nose: Nose normal.     Right Sinus: No maxillary sinus tenderness or frontal sinus tenderness.     Left Sinus: No maxillary sinus tenderness or frontal sinus tenderness.     Mouth/Throat:     Mouth: Mucous membranes are moist.     Pharynx: Oropharynx is clear. Uvula midline. No pharyngeal swelling, oropharyngeal exudate or posterior oropharyngeal erythema.  Eyes:     General: Lids are normal.        Right eye: No discharge.        Left eye: No discharge.     Extraocular Movements: Extraocular movements intact.     Conjunctiva/sclera: Conjunctivae normal.     Pupils: Pupils are equal, round, and reactive to light.  Visual Fields: Right eye visual fields normal and left eye visual fields normal.  Neck:     Thyroid: No thyromegaly.     Vascular: No carotid bruit.     Trachea: Trachea normal.  Cardiovascular:     Rate and Rhythm: Normal rate and regular rhythm.     Heart sounds: Normal heart sounds. No murmur. No gallop.   Pulmonary:     Effort: Pulmonary effort is normal. No accessory muscle usage or respiratory distress.     Breath sounds: Normal breath sounds.  Chest:     Comments: Deferred per patient  request Abdominal:     General: Bowel sounds are normal.     Palpations: Abdomen is soft. There is no hepatomegaly or splenomegaly.     Tenderness: There is no abdominal tenderness.  Musculoskeletal:        General: Normal range of motion.     Cervical back: Normal range of motion and neck supple.     Right lower leg: No edema.     Left lower leg: No edema.  Lymphadenopathy:     Head:     Right side of head: No submental, submandibular, tonsillar, preauricular or posterior auricular adenopathy.     Left side of head: No submental, submandibular, tonsillar, preauricular or posterior auricular adenopathy.     Cervical: No cervical adenopathy.  Skin:    General: Skin is warm and dry.     Capillary Refill: Capillary refill takes less than 2 seconds.     Findings: Lesion present. No rash.       Neurological:     Mental Status: She is alert and oriented to person, place, and time.     Cranial Nerves: Cranial nerves are intact.     Gait: Gait is intact.     Deep Tendon Reflexes: Reflexes are normal and symmetric.     Reflex Scores:      Brachioradialis reflexes are 2+ on the right side and 2+ on the left side.      Patellar reflexes are 2+ on the right side and 2+ on the left side. Psychiatric:        Attention and Perception: Attention normal.        Mood and Affect: Mood normal.        Speech: Speech normal.        Behavior: Behavior normal. Behavior is cooperative.        Thought Content: Thought content normal.        Judgment: Judgment normal.     Results for orders placed or performed during the hospital encounter of 06/04/18  CBC  Result Value Ref Range   WBC 13.3 (H) 4.0 - 10.5 K/uL   RBC 3.44 (L) 3.87 - 5.11 MIL/uL   Hemoglobin 10.4 (L) 12.0 - 15.0 g/dL   HCT 40.932.4 (L) 81.136.0 - 91.446.0 %   MCV 94.2 80.0 - 100.0 fL   MCH 30.2 26.0 - 34.0 pg   MCHC 32.1 30.0 - 36.0 g/dL   RDW 78.212.7 95.611.5 - 21.315.5 %   Platelets 306 150 - 400 K/uL   nRBC 0.0 0.0 - 0.2 %  Basic metabolic panel   Result Value Ref Range   Sodium 136 135 - 145 mmol/L   Potassium 4.0 3.5 - 5.1 mmol/L   Chloride 104 98 - 111 mmol/L   CO2 24 22 - 32 mmol/L   Glucose, Bld 139 (H) 70 - 99 mg/dL   BUN 10 6 - 20 mg/dL  Creatinine, Ser 0.50 0.44 - 1.00 mg/dL   Calcium 8.3 (L) 8.9 - 10.3 mg/dL   GFR calc non Af Amer >60 >60 mL/min   GFR calc Af Amer >60 >60 mL/min   Anion gap 8 5 - 15      Assessment & Plan:   Problem List Items Addressed This Visit      Cardiovascular and Mediastinum   Essential hypertension    Chronic, ongoing with BP at goal.  Continue current medication regimen and adjust as needed.   Recommend she check BP at home at least 3 mornings a week.  Labs today.  Return in 6 months.      Relevant Medications   amLODipine-olmesartan (AZOR) 5-40 MG tablet   Other Relevant Orders   Lipid Panel w/o Chol/HDL Ratio   Comprehensive metabolic panel   TSH     Endocrine   Multinodular goiter    Check TSH today.        Other   Anxiety    Chronic, stable with use of Amitriptyline (for sleep and mood) + occasional Vistaril.  Refills sent.  Denies SI/HI.  Continue current medication regimen and adjust as needed.  Return in 6 months.      Relevant Medications   amitriptyline (ELAVIL) 50 MG tablet   hydrOXYzine (ATARAX/VISTARIL) 10 MG tablet   Insomnia    Chronic, ongoing.  Continue current medication regimen and adjust as needed.  Refills sent.  Educated on sleep hygiene techniques.      Overweight (BMI 25.0-29.9)    Recommend continued focus on healthy diet choices and regular physical activity (30 minutes 5 days a week).       Elevated LDL cholesterol level    Noted on past labs, ASCVD 3.3%.  No current statin, continue diet focus and check lipid panel today.  Initiate medication as needed.      Relevant Orders   Lipid Panel w/o Chol/HDL Ratio    Other Visit Diagnoses    Annual physical exam    -  Primary   Annual labs to include CBC, CMP, TSH, lipid   Relevant Orders    CBC with Differential/Platelet   Symptomatic lesion of skin       Referral to dermatology placed.   Relevant Orders   Ambulatory referral to Dermatology   Vitamin D deficiency       Reports history of low level, recheck today and start supplement as needed.   Relevant Orders   VITAMIN D 25 Hydroxy (Vit-D Deficiency, Fractures)   Encounter for screening mammogram for malignant neoplasm of breast       Mammogram order placed.   Relevant Orders   MM DIGITAL SCREENING BILATERAL       Follow up plan: Return in about 6 months (around 02/10/2020) for HTN/HLD, Mood, Insomnia.   LABORATORY TESTING:  - Pap smear: up to date  IMMUNIZATIONS:   - Tdap: Tetanus vaccination status reviewed: last tetanus booster within 10 years. - Influenza: Refused - Pneumovax: Not applicable - Prevnar: Not applicable - HPV: Not applicable - Zostavax vaccine: Refused  SCREENING: -Mammogram: Ordered today  - Colonoscopy: 2 years ago did Cologuard with [facility] and reports this was negative - Bone Density: Not applicable  -Hearing Test: Not applicable  -Spirometry: Not applicable   PATIENT COUNSELING:   Advised to take 1 mg of folate supplement per day if capable of pregnancy.   Sexuality: Discussed sexually transmitted diseases, partner selection, use of condoms, avoidance of unintended pregnancy  and contraceptive alternatives.   Advised to avoid cigarette smoking.  I discussed with the patient that most people either abstain from alcohol or drink within safe limits (<=14/week and <=4 drinks/occasion for males, <=7/weeks and <= 3 drinks/occasion for females) and that the risk for alcohol disorders and other health effects rises proportionally with the number of drinks per week and how often a drinker exceeds daily limits.  Discussed cessation/primary prevention of drug use and availability of treatment for abuse.   Diet: Encouraged to adjust caloric intake to maintain  or achieve ideal body  weight, to reduce intake of dietary saturated fat and total fat, to limit sodium intake by avoiding high sodium foods and not adding table salt, and to maintain adequate dietary potassium and calcium preferably from fresh fruits, vegetables, and low-fat dairy products.    stressed the importance of regular exercise  Injury prevention: Discussed safety belts, safety helmets, smoke detector, smoking near bedding or upholstery.   Dental health: Discussed importance of regular tooth brushing, flossing, and dental visits.    NEXT PREVENTATIVE PHYSICAL DUE IN 1 YEAR. Return in about 6 months (around 02/10/2020) for HTN/HLD, Mood, Insomnia.

## 2019-08-13 NOTE — Assessment & Plan Note (Signed)
Chronic, ongoing.  Continue current medication regimen and adjust as needed.  Refills sent.  Educated on sleep hygiene techniques.

## 2019-08-13 NOTE — Patient Instructions (Signed)
Healthy Eating Following a healthy eating pattern may help you to achieve and maintain a healthy body weight, reduce the risk of chronic disease, and live a long and productive life. It is important to follow a healthy eating pattern at an appropriate calorie level for your body. Your nutritional needs should be met primarily through food by choosing a variety of nutrient-rich foods. What are tips for following this plan? Reading food labels  Read labels and choose the following: ? Reduced or low sodium. ? Juices with 100% fruit juice. ? Foods with low saturated fats and high polyunsaturated and monounsaturated fats. ? Foods with whole grains, such as whole wheat, cracked wheat, brown rice, and wild rice. ? Whole grains that are fortified with folic acid. This is recommended for women who are pregnant or who want to become pregnant.  Read labels and avoid the following: ? Foods with a lot of added sugars. These include foods that contain brown sugar, corn sweetener, corn syrup, dextrose, fructose, glucose, high-fructose corn syrup, honey, invert sugar, lactose, malt syrup, maltose, molasses, raw sugar, sucrose, trehalose, or turbinado sugar.  Do not eat more than the following amounts of added sugar per day:  6 teaspoons (25 g) for women.  9 teaspoons (38 g) for men. ? Foods that contain processed or refined starches and grains. ? Refined grain products, such as white flour, degermed cornmeal, white bread, and white rice. Shopping  Choose nutrient-rich snacks, such as vegetables, whole fruits, and nuts. Avoid high-calorie and high-sugar snacks, such as potato chips, fruit snacks, and candy.  Use oil-based dressings and spreads on foods instead of solid fats such as butter, stick margarine, or cream cheese.  Limit pre-made sauces, mixes, and "instant" products such as flavored rice, instant noodles, and ready-made pasta.  Try more plant-protein sources, such as tofu, tempeh, black beans,  edamame, lentils, nuts, and seeds.  Explore eating plans such as the Mediterranean diet or vegetarian diet. Cooking  Use oil to saut or stir-fry foods instead of solid fats such as butter, stick margarine, or lard.  Try baking, boiling, grilling, or broiling instead of frying.  Remove the fatty part of meats before cooking.  Steam vegetables in water or broth. Meal planning   At meals, imagine dividing your plate into fourths: ? One-half of your plate is fruits and vegetables. ? One-fourth of your plate is whole grains. ? One-fourth of your plate is protein, especially lean meats, poultry, eggs, tofu, beans, or nuts.  Include low-fat dairy as part of your daily diet. Lifestyle  Choose healthy options in all settings, including home, work, school, restaurants, or stores.  Prepare your food safely: ? Wash your hands after handling raw meats. ? Keep food preparation surfaces clean by regularly washing with hot, soapy water. ? Keep raw meats separate from ready-to-eat foods, such as fruits and vegetables. ? Cook seafood, meat, poultry, and eggs to the recommended internal temperature. ? Store foods at safe temperatures. In general:  Keep cold foods at 59F (4.4C) or below.  Keep hot foods at 159F (60C) or above.  Keep your freezer at [facility] (-17.8C) or below.  Foods are no longer safe to eat when they have been between the temperatures of 40-159F (4.4-60C) for more than 2 hours. What foods should I eat? Fruits Aim to eat 2 cup-equivalents of fresh, canned (in natural juice), or frozen fruits each day. Examples of 1 cup-equivalent of fruit include 1 small apple, 8 large strawberries, 1 cup canned fruit,  cup  dried fruit, or 1 cup 100% juice. Vegetables Aim to eat 2-3 cup-equivalents of fresh and frozen vegetables each day, including different varieties and colors. Examples of 1 cup-equivalent of vegetables include 2 medium carrots, 2 cups raw, leafy greens, 1 cup chopped  vegetable (raw or cooked), or 1 medium baked potato. Grains Aim to eat 6 ounce-equivalents of whole grains each day. Examples of 1 ounce-equivalent of grains include 1 slice of bread, 1 cup ready-to-eat cereal, 3 cups popcorn, or  cup cooked rice, pasta, or cereal. Meats and other proteins Aim to eat 5-6 ounce-equivalents of protein each day. Examples of 1 ounce-equivalent of protein include 1 egg, 1/2 cup nuts or seeds, or 1 tablespoon (16 g) peanut butter. A cut of meat or fish that is the size of a deck of cards is about 3-4 ounce-equivalents.  Of the protein you eat each week, try to have at least 8 ounces come from seafood. This includes salmon, trout, herring, and anchovies. Dairy Aim to eat 3 cup-equivalents of fat-free or low-fat dairy each day. Examples of 1 cup-equivalent of dairy include 1 cup (240 mL) milk, 8 ounces (250 g) yogurt, 1 ounces (44 g) natural cheese, or 1 cup (240 mL) fortified soy milk. Fats and oils  Aim for about 5 teaspoons (21 g) per day. Choose monounsaturated fats, such as canola and olive oils, avocados, peanut butter, and most nuts, or polyunsaturated fats, such as sunflower, corn, and soybean oils, walnuts, pine nuts, sesame seeds, sunflower seeds, and flaxseed. Beverages  Aim for six 8-oz glasses of water per day. Limit coffee to three to five 8-oz cups per day.  Limit caffeinated beverages that have added calories, such as soda and energy drinks.  Limit alcohol intake to no more than 1 drink a day for nonpregnant women and 2 drinks a day for men. One drink equals 12 oz of beer (355 mL), 5 oz of wine (148 mL), or 1 oz of hard liquor (44 mL). Seasoning and other foods  Avoid adding excess amounts of salt to your foods. Try flavoring foods with herbs and spices instead of salt.  Avoid adding sugar to foods.  Try using oil-based dressings, sauces, and spreads instead of solid fats. This information is based on general [place] nutrition guidelines. For more  information, visit [employer]. Exact amounts may vary based on your nutrition needs. Summary  A healthy eating plan may help you to maintain a healthy weight, reduce the risk of chronic diseases, and stay active throughout your life.  Plan your meals. Make sure you eat the right portions of a variety of nutrient-rich foods.  Try baking, boiling, grilling, or broiling instead of frying.  Choose healthy options in all settings, including home, work, school, restaurants, or stores. This information is not intended to replace advice given to you by your health care provider. Make sure you discuss any questions you have with your health care provider. Document Revised: 09/24/2017 Document Reviewed: 09/24/2017 Elsevier Patient Education  [place].

## 2019-08-13 NOTE — Assessment & Plan Note (Signed)
Chronic, stable with use of Amitriptyline (for sleep and mood) + occasional Vistaril.  Refills sent.  Denies SI/HI.  Continue current medication regimen and adjust as needed.  Return in 6 months.

## 2019-08-14 LAB — COMPREHENSIVE METABOLIC PANEL
ALT: 25 IU/L (ref 0–32)
AST: 33 IU/L (ref 0–40)
Albumin/Globulin Ratio: 1.7 (ref 1.2–2.2)
Albumin: 4.3 g/dL (ref 3.8–4.9)
Alkaline Phosphatase: 58 IU/L (ref 39–117)
BUN/Creatinine Ratio: 14 (ref 9–23)
BUN: 10 mg/dL (ref 6–24)
Bilirubin Total: 0.3 mg/dL (ref 0.0–1.2)
CO2: 22 mmol/L (ref 20–29)
Calcium: 9.4 mg/dL (ref 8.7–10.2)
Chloride: 101 mmol/L (ref 96–106)
Creatinine, Ser: 0.72 mg/dL (ref 0.57–1.00)
GFR calc Af Amer: 108 mL/min/{1.73_m2} (ref >59)
GFR calc non Af Amer: 94 mL/min/{1.73_m2} (ref >59)
Globulin, Total: 2.5 g/dL (ref 1.5–4.5)
Glucose: 97 mg/dL (ref 65–99)
Potassium: 4.1 mmol/L (ref 3.5–5.2)
Sodium: 139 mmol/L (ref 134–144)
Total Protein: 6.8 g/dL (ref 6.0–8.5)

## 2019-08-14 LAB — CBC WITH DIFFERENTIAL/PLATELET
Basophils Absolute: 0.1 10*3/uL (ref 0.0–0.2)
Basos: 1 %
EOS (ABSOLUTE): 0.1 10*3/uL (ref 0.0–0.4)
Eos: 2 %
Hematocrit: 38.7 % (ref 34.0–46.6)
Hemoglobin: 12.8 g/dL (ref 11.1–15.9)
Immature Grans (Abs): 0 10*3/uL (ref 0.0–0.1)
Immature Granulocytes: 0 %
Lymphocytes Absolute: 3.3 10*3/uL — ABNORMAL HIGH (ref 0.7–3.1)
Lymphs: 42 %
MCH: 29.6 pg (ref 26.6–33.0)
MCHC: 33.1 g/dL (ref 31.5–35.7)
MCV: 90 fL (ref 79–97)
Monocytes Absolute: 0.5 10*3/uL (ref 0.1–0.9)
Monocytes: 7 %
Neutrophils Absolute: 3.8 10*3/uL (ref 1.4–7.0)
Neutrophils: 48 %
Platelets: 334 10*3/uL (ref 150–450)
RBC: 4.32 x10E6/uL (ref 3.77–5.28)
RDW: 13.1 % (ref 11.7–15.4)
WBC: 7.9 10*3/uL (ref 3.4–10.8)

## 2019-08-14 LAB — TSH: TSH: 1.64 u[IU]/mL (ref 0.450–4.500)

## 2019-08-14 LAB — LIPID PANEL W/O CHOL/HDL RATIO
Cholesterol, Total: 257 mg/dL — ABNORMAL HIGH (ref 100–199)
HDL: 71 mg/dL (ref >39)
LDL Chol Calc (NIH): 173 mg/dL — ABNORMAL HIGH (ref 0–99)
Triglycerides: 79 mg/dL (ref 0–149)
VLDL Cholesterol Cal: 13 mg/dL (ref 5–40)

## 2019-08-14 LAB — VITAMIN D 25 HYDROXY (VIT D DEFICIENCY, FRACTURES): Vit D, 25-Hydroxy: 22.8 ng/mL — ABNORMAL LOW (ref 30.0–100.0)

## 2019-08-14 NOTE — Progress Notes (Signed)
Contacted via MyChart The 10-year ASCVD risk score Denman George DC Jr., et al., 2013) is: 3.5%   Values used to calculate the score:     Age: 59 years     Sex: Female     Is Non-Hispanic African American: No     Diabetic: No     Tobacco smoker: No     Systolic Blood Pressure: 137 mmHg     Is BP treated: Yes     HDL Cholesterol: 71 mg/dL     Total Cholesterol: 257 mg/dL

## 2019-10-22 ENCOUNTER — Other Ambulatory Visit: Payer: Self-pay

## 2019-10-22 ENCOUNTER — Telehealth (INDEPENDENT_AMBULATORY_CARE_PROVIDER_SITE_OTHER): Payer: Self-pay | Admitting: Nurse Practitioner

## 2019-10-22 ENCOUNTER — Ambulatory Visit (INDEPENDENT_AMBULATORY_CARE_PROVIDER_SITE_OTHER): Payer: BC Managed Care – PPO | Admitting: Dermatology

## 2019-10-22 DIAGNOSIS — I781 Nevus, non-neoplastic: Secondary | ICD-10-CM

## 2019-10-22 DIAGNOSIS — I8393 Asymptomatic varicose veins of bilateral lower extremities: Secondary | ICD-10-CM

## 2019-10-22 NOTE — Patient Instructions (Signed)
BEFORE YOUR APPOINTMENT FOR SCLEROTHERAPY  1. When you telephone for your appointment for the sclerotherapy procedure, please let the receptionist know that you are scheduling for the fifteen (15) minute sclerotherapy procedure not just a regular visit.  2. On the day of the procedure, please cleanse and dry the areas, but do not use any moisturizers or other products on the area(s) to be treated.  3. Bring a pair of comfortable shorts to wear during the procedure.  4. Be sure to bring your recommended graduated compression stockings with you to the office. You will be wearing them home when your visit is over. These compression hose can be purchased at most medical supply stores.  After Your Sclerotherapy Procedure  1. Please wear the graduated compression stockings for 24 hours immediately following the completion of the sclerotherapy procedure.  2. We recommend that you avoid vigorous activity as much as possible for the first twenty-four (24) hours. You can do your "normal" routine, but avoid an above normal amount of time on your feet. Elevating the legs when sitting and avoidance of vigorous leg movements or exercise in the first few days after treatment may improve your results.  3. You may remove the compression dressings (cotton balls) and tape the next morning.  4. Please continue wearing the compression stockings during waking hours for the two (2) weeks following sclerotherapy.  5. If you have any blisters, sores or ulcers or other problems following your procedure please call or return to the office immediately.     THE PROCEDURE FEE IS $350.00 PER FIFTEEN (15) MINUTE SESSION. WE REQUIRE THAT THIS PROCEDURE BE PAID FOR IN FULL ON OR BEFORE THE DATE THAT IT IS PERFORMED. WE WILL GIVE YOU A RECEIPT THAT YOU CAN FILE WITH YOUR INSURANCE COMPANY. WE GENERALLY DO NOT FILE THIS PROCEDURE WITH ANY INSURANCE COMPANY EXCEPT UNDER CERTAIN CIRCUMSTANCES WHERE PRIOR AUTHORIZATION HAS BEEN  CONFIRMED. THIS PROCEDURE IS GENERALLY CONSIDERED TO BE A COSMETIC PROCEDURE BY INSURANCE COMPANIES. 

## 2019-10-22 NOTE — Progress Notes (Signed)
   New Patient Visit  Subjective  Carolyn Wood is a 59 y.o. female who presents for the following: spider veins (bil lets, Patient here today for sclero consult.).  Patient here today for sclerotherapy consult. She has had leg veins treated multiple times, the last treatment was in November 2020 with Dr. Cheree Ditto. Patient did not see much improvement with the last treatment. She has also had ultrasound of the lower legs within the last 6 months and everything was WNL.  The following portions of the chart were reviewed this encounter and updated as appropriate:      Review of Systems:  No other skin or systemic complaints except as noted in HPI or Assessment and Plan.  Objective  Well appearing patient in no apparent distress; mood and affect are within normal limits.  A focused examination was performed including legs. Relevant physical exam findings are noted in the Assessment and Plan.  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  Treating these first will likely decrease amount of spider veins.  Spider veins bil legs  Discussed Sclerotherapy, handout given on Asclera  Discussed risk of bruising and PIH with sclerotherapy. Discussed will take several treatments for best results, 4-6 weeks between session.  Need compression for 2 weeks during day following procedure   Return for PRN sclerotherapy.     Documentation: I have reviewed the above documentation for accuracy and completeness, and I agree with the above.  Willeen Niece, MD   I, Ardis Rowan, RMA, am acting as scribe for Willeen Niece, MD .

## 2019-10-22 NOTE — Telephone Encounter (Signed)
The patient had no evidence of venous reflux on her 03/05/2019 ultrasound.  We only treat varicose veins that have venous reflux.  With no evidence of venous reflux it is considered cosmetic.  We can get her in with another bilateral reflux study but if the results are still the same, we will still not be able to do any sclerotherapy.

## 2019-10-22 NOTE — Telephone Encounter (Addendum)
Called stating that [facility] asked her to reach out to [place] to start Sclerotherapy. She was last seen 03-05-19 as a new patient w/ FB. She would like to come in to be seen to discuss care plan for her veins. Please advise.

## 2019-10-22 NOTE — Telephone Encounter (Signed)
Per your note on 03/05/19 the patient does not have reflux. See note below

## 2019-10-22 NOTE — Telephone Encounter (Signed)
See Fallon's message.

## 2019-10-27 ENCOUNTER — Other Ambulatory Visit (HOSPITAL_COMMUNITY)
Admission: RE | Admit: 2019-10-27 | Discharge: 2019-10-27 | Disposition: A | Discharge status: Home / Self Care | Payer: BC Managed Care – PPO | Source: Ambulatory Visit | Attending: Obstetrics & Gynecology | Admitting: Obstetrics & Gynecology

## 2019-10-27 ENCOUNTER — Other Ambulatory Visit: Payer: Self-pay

## 2019-10-27 ENCOUNTER — Encounter: Payer: Self-pay | Admitting: Obstetrics & Gynecology

## 2019-10-27 ENCOUNTER — Ambulatory Visit (INDEPENDENT_AMBULATORY_CARE_PROVIDER_SITE_OTHER): Payer: BC Managed Care – PPO | Admitting: Obstetrics & Gynecology

## 2019-10-27 VITALS — BP 120/80 | Ht 67.0 in | Wt 182.0 lb

## 2019-10-27 DIAGNOSIS — Z124 Encounter for screening for malignant neoplasm of cervix: Secondary | ICD-10-CM

## 2019-10-27 DIAGNOSIS — Z01419 Encounter for gynecological examination (general) (routine) without abnormal findings: Secondary | ICD-10-CM

## 2019-10-27 DIAGNOSIS — Z1231 Encounter for screening mammogram for malignant neoplasm of breast: Secondary | ICD-10-CM

## 2019-10-27 DIAGNOSIS — Z1211 Encounter for screening for malignant neoplasm of colon: Secondary | ICD-10-CM | POA: Diagnosis not present
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
## 2019-10-27 NOTE — Patient Instructions (Signed)
PAP every three years Mammogram every year    Call [phone] to schedule at [place] Colonoscopy every 10 years Labs yearly (with PCP)   

## 2019-10-29 LAB — CYTOLOGY - PAP
Comment: NEGATIVE
Diagnosis: NEGATIVE
High risk HPV: NEGATIVE

## 2019-11-14 LAB — FECAL OCCULT BLOOD, IMMUNOCHEMICAL: Fecal Occult Bld: NEGATIVE

## 2019-11-14 LAB — SPECIMEN STATUS REPORT

## 2019-12-01 ENCOUNTER — Ambulatory Visit (INDEPENDENT_AMBULATORY_CARE_PROVIDER_SITE_OTHER): Payer: Self-pay | Admitting: Dermatology

## 2019-12-01 ENCOUNTER — Other Ambulatory Visit: Payer: Self-pay

## 2019-12-01 DIAGNOSIS — I8393 Asymptomatic varicose veins of bilateral lower extremities: Secondary | ICD-10-CM

## 2019-12-01 NOTE — Patient Instructions (Signed)
BEFORE YOUR APPOINTMENT FOR SCLEROTHERAPY  1. When you telephone for your appointment for the sclerotherapy procedure, please let the receptionist know that you are scheduling for the fifteen (15) minute sclerotherapy procedure not just a regular visit.  2. On the day of the procedure, please cleanse and dry the areas, but do not use any moisturizers or other products on the area(s) to be treated.  3. Bring a pair of comfortable shorts to wear during the procedure.  4. Be sure to bring your recommended graduated compression stockings with you to the office. You will be wearing them home when your visit is over. These compression hose can be purchased at most medical supply stores.  After Your Sclerotherapy Procedure  1. Please wear the graduated compression stockings for 24 hours immediately following the completion of the sclerotherapy procedure.  2. We recommend that you avoid vigorous activity as much as possible for the first twenty-four (24) hours. You can do your "normal" routine, but avoid an above normal amount of time on your feet. Elevating the legs when sitting and avoidance of vigorous leg movements or exercise in the first few days after treatment may improve your results.  3. You may remove the compression dressings (cotton balls) and tape the next morning.  4. Please continue wearing the compression stockings during waking hours for the two (2) weeks following sclerotherapy.  5. If you have any blisters, sores or ulcers or other problems following your procedure please call or return to the office immediately.     THE PROCEDURE FEE IS $350.00 PER FIFTEEN (15) MINUTE SESSION. WE REQUIRE THAT THIS PROCEDURE BE PAID FOR IN FULL ON OR BEFORE THE DATE THAT IT IS PERFORMED. WE WILL GIVE YOU A RECEIPT THAT YOU CAN FILE WITH YOUR INSURANCE COMPANY. WE GENERALLY DO NOT FILE THIS PROCEDURE WITH ANY INSURANCE COMPANY EXCEPT UNDER CERTAIN CIRCUMSTANCES WHERE PRIOR AUTHORIZATION HAS BEEN  CONFIRMED. THIS PROCEDURE IS GENERALLY CONSIDERED TO BE A COSMETIC PROCEDURE BY INSURANCE COMPANIES. 

## 2019-12-01 NOTE — Progress Notes (Signed)
   Follow-Up Visit   Subjective  Carolyn Wood is a 59 y.o. female who presents for the following: spider veins (patient is here today for sclerotherapy ).  The following portions of the chart were reviewed this encounter and updated as appropriate:     Review of Systems:  No other skin or systemic complaints except as noted in HPI or Assessment and Plan.  Objective  Well appearing patient in no apparent distress; mood and affect are within normal limits.  A focused examination was performed including the bilateral legs. Relevant physical exam findings are noted in the Assessment and Plan.  Objective  Left Lower Leg - Anterior: Blue veins  Images                           Assessment & Plan  Spider veins of both lower extremities Left Lower Leg - Anterior  Sclerotherapy - Left Lower Leg - Anterior The patient presents for desired sclerotherapy for desired treatment of desired treatment of small to medium blue varicosities of the B/L leg.  Procedure: The patient was counseled and understands about the effects, side effects and potential risks and complications of the sclerotherapy procedure. The patient was given the opportunity to ask questions. Asclera (polidocanol) 1% (total 4.0 cc) was injected into the varices of the BL lower legs. In order to ensure correct placement of the catheter in the vein, I drew back slightly to give moderate blood show in the syringe. If there was any evidence or suspicion of extravasation of sclerosant, the area was immediately diluted with a large volume of 0.9% saline. A pressure dressing was applied immediately to the injected sites. The patient tolerated the procedure well without complication. The patient was instructed in post-operative compression stocking use. The patient understands to call or return immediately if any problems noted.   Return if symptoms worsen or fail to improve.  Maylene Roes, CMA, am  acting as scribe for Willeen Niece, MD .  Documentation: I have reviewed the above documentation for accuracy and completeness, and I agree with the above.  Willeen Niece MD

## 2020-01-12 ENCOUNTER — Ambulatory Visit (INDEPENDENT_AMBULATORY_CARE_PROVIDER_SITE_OTHER): Payer: Self-pay | Admitting: Dermatology

## 2020-01-12 ENCOUNTER — Other Ambulatory Visit: Payer: Self-pay

## 2020-01-12 DIAGNOSIS — I8393 Asymptomatic varicose veins of bilateral lower extremities: Secondary | ICD-10-CM

## 2020-01-12 NOTE — Patient Instructions (Signed)
BEFORE YOUR APPOINTMENT FOR SCLEROTHERAPY  1. When you telephone for your appointment for the sclerotherapy procedure, please let the receptionist know that you are scheduling for the fifteen (15) minute sclerotherapy procedure not just a regular visit.  2. On the day of the procedure, please cleanse and dry the areas, but do not use any moisturizers or other products on the area(s) to be treated.  3. Bring a pair of comfortable shorts to wear during the procedure.  4. Be sure to bring your recommended graduated compression stockings with you to the office. You will be wearing them home when your visit is over. These compression hose can be purchased at most medical supply stores.  After Your Sclerotherapy Procedure  1. Please wear the graduated compression stockings for 24 hours immediately following the completion of the sclerotherapy procedure.  2. We recommend that you avoid vigorous activity as much as possible for the first twenty-four (24) hours. You can do your "normal" routine, but avoid an above normal amount of time on your feet. Elevating the legs when sitting and avoidance of vigorous leg movements or exercise in the first few days after treatment may improve your results.  3. You may remove the compression dressings (cotton balls) and tape the next morning.  4. Please continue wearing the compression stockings during waking hours for the two (2) weeks following sclerotherapy.  5. If you have any blisters, sores or ulcers or other problems following your procedure please call or return to the office immediately.     THE PROCEDURE FEE IS $350.00 PER FIFTEEN (15) MINUTE SESSION. WE REQUIRE THAT THIS PROCEDURE BE PAID FOR IN FULL ON OR BEFORE THE DATE THAT IT IS PERFORMED. WE WILL GIVE YOU A RECEIPT THAT YOU CAN FILE WITH YOUR INSURANCE COMPANY. WE GENERALLY DO NOT FILE THIS PROCEDURE WITH ANY INSURANCE COMPANY EXCEPT UNDER CERTAIN CIRCUMSTANCES WHERE PRIOR AUTHORIZATION HAS BEEN  CONFIRMED. THIS PROCEDURE IS GENERALLY CONSIDERED TO BE A COSMETIC PROCEDURE BY INSURANCE COMPANIES. 

## 2020-01-12 NOTE — Progress Notes (Signed)
   Follow-Up Visit   Subjective  Carolyn Wood is a 59 y.o. female who presents for the following: Procedure.  Patient here today for sclerotherapy. Pt had good results from prior treatment- mainly R lower leg  The following portions of the chart were reviewed this encounter and updated as appropriate:      Review of Systems:  No other skin or systemic complaints except as noted in HPI or Assessment and Plan.  Objective  Well appearing patient in no apparent distress; mood and affect are within normal limits.  A focused examination was performed including legs. Relevant physical exam findings are noted in the Assessment and Plan.  Objective  Bilateral Leg: Blue veins  Images             Assessment & Plan  Spider veins of both lower extremities Bilateral Leg  Sclerotherapy - Bilateral Leg The patient presents for desired sclerotherapy for desired treatment of desired treatment of small to medium blue and red varicosities of the right and left legs.  Treated R lower leg, lateral and medial, R anterior thigh, L lateral knee  Procedure: The patient was counseled and understands about the effects, side effects and potential risks and complications of the sclerotherapy procedure. The patient was given the opportunity to ask questions. Asclera (polidocanol) 1% (total 2  cc) was injected into the varices. In order to ensure correct placement of the catheter in the vein, I drew back slightly to give moderate blood show in the syringe. If there was any evidence or suspicion of extravasation of sclerosant, the area was immediately diluted with a large volume of 0.9% saline. A pressure dressing was applied immediately to the injected sites. The patient tolerated the procedure well without complication. The patient was instructed in post-operative compression stocking use. The patient understands to call or return immediately if any problems noted.   Return in about 4 weeks  (around 02/09/2020) for sclerotherapy L thigh, lower leg.  Anise Salvo, RMA, am acting as scribe for Willeen Niece, MD . Documentation: I have reviewed the above documentation for accuracy and completeness, and I agree with the above.  Willeen Niece MD

## 2020-01-30 ENCOUNTER — Telehealth: Payer: Self-pay

## 2020-01-30 NOTE — Telephone Encounter (Signed)
-----   Message from Nadara Mustard, MD sent at 01/22/2020 11:12 PM EDT ----- Regarding: MMG Received notice she has not received MMG yet as ordered at her Annual. Please check and encourage her to do this, and document conversation.

## 2020-01-30 NOTE — Telephone Encounter (Signed)
Pt states she has an appointment Monday for her mammogram

## 2020-02-02 ENCOUNTER — Other Ambulatory Visit: Payer: Self-pay | Admitting: Nurse Practitioner

## 2020-02-02 ENCOUNTER — Ambulatory Visit
Admission: RE | Admit: 2020-02-02 | Discharge: 2020-02-02 | Disposition: A | Discharge status: Home / Self Care | Payer: BC Managed Care – PPO | Source: Ambulatory Visit | Attending: Nurse Practitioner | Admitting: Nurse Practitioner

## 2020-02-02 ENCOUNTER — Other Ambulatory Visit: Payer: Self-pay

## 2020-02-02 DIAGNOSIS — Z1231 Encounter for screening mammogram for malignant neoplasm of breast: Secondary | ICD-10-CM | POA: Diagnosis not present

## 2020-02-16 ENCOUNTER — Ambulatory Visit: Payer: BC Managed Care – PPO | Admitting: Dermatology

## 2020-03-09 ENCOUNTER — Other Ambulatory Visit: Payer: Self-pay | Admitting: Obstetrics & Gynecology

## 2020-03-09 MED ORDER — VALACYCLOVIR HCL 500 MG PO TABS: 500.0000 mg | ORAL_TABLET | Freq: Every day | ORAL | 11 refills | Status: DC

## 2020-03-22 ENCOUNTER — Ambulatory Visit: Payer: BC Managed Care – PPO | Admitting: Dermatology

## 2020-03-30 ENCOUNTER — Ambulatory Visit: Payer: BC Managed Care – PPO | Admitting: Dermatology

## 2020-03-30 ENCOUNTER — Other Ambulatory Visit: Payer: Self-pay

## 2020-03-30 DIAGNOSIS — L820 Inflamed seborrheic keratosis: Secondary | ICD-10-CM | POA: Diagnosis not present

## 2020-03-30 DIAGNOSIS — L821 Other seborrheic keratosis: Secondary | ICD-10-CM | POA: Diagnosis not present

## 2020-03-30 NOTE — Progress Notes (Signed)
   Follow-Up Visit   Subjective  Carolyn Wood is a 59 y.o. female who presents for the following: 2 spots (forehead x years. Started growing 2 months ago.) and Growth (left side. Treated in past by another Dermatologist. Still there.).  Also under right breast.  Spots get irritated.   The following portions of the chart were reviewed this encounter and updated as appropriate:      Review of Systems:  No other skin or systemic complaints except as noted in HPI or Assessment and Plan.  Objective  Well appearing patient in no apparent distress; mood and affect are within normal limits.  A focused examination was performed including face. Relevant physical exam findings are noted in the Assessment and Plan.  Objective  Left Upper Forehead x 2, Left Flank at Waistline x 1, R inframammary x 1 (4): Erythematous keratotic or waxy stuck-on papule or plaque.    Assessment & Plan  Inflamed seborrheic keratosis (4) Left Upper Forehead x 2, Left Flank at Waistline x 1, R inframammary x 1  Destruction of lesion - Left Upper Forehead x 2, Left Flank at Waistline x 1, R inframammary x 1  Destruction method: cryotherapy   Informed consent: discussed and consent obtained   Lesion destroyed using liquid nitrogen: Yes   Region frozen until ice ball extended beyond lesion: Yes   Outcome: patient tolerated procedure well with no complications   Post-procedure details: wound care instructions given    Seborrheic Keratoses - Stuck-on, waxy, tan-brown papules and plaques  - Discussed benign etiology and prognosis. - Observe - Call for any changes  Return if symptoms worsen or fail to improve.   ICherlyn Labella, CMA, am acting as scribe for Willeen Niece, MD .  Documentation: I have reviewed the above documentation for accuracy and completeness, and I agree with the above.  Willeen Niece MD

## 2020-03-30 NOTE — Patient Instructions (Addendum)
Seborrheic Keratosis  What causes seborrheic keratoses? Seborrheic keratoses are harmless, common skin growths that first appear during adult life.  As time goes by, more growths appear.  Some people may develop a large number of them.  Seborrheic keratoses appear on both covered and uncovered body parts.  They are not caused by sunlight.  The tendency to develop seborrheic keratoses can be inherited.  They vary in color from skin-colored to gray, brown, or even black.  They can be either smooth or have a rough, warty surface.   Seborrheic keratoses are superficial and look as if they were stuck on the skin.  Under the microscope this type of keratosis looks like layers upon layers of skin.  That is why at times the top layer may seem to fall off, but the rest of the growth remains and re-grows.    Treatment Seborrheic keratoses do not need to be treated, but can easily be removed in the office.  Seborrheic keratoses often cause symptoms when they rub on clothing or jewelry.  Lesions can be in the way of shaving.  If they become inflamed, they can cause itching, soreness, or burning.  Removal of a seborrheic keratosis can be accomplished by freezing, burning, or surgery. If any spot bleeds, scabs, or grows rapidly, please return to have it checked, as these can be an indication of a skin cancer.   Cryotherapy Aftercare  Wash gently with soap and water everyday.   Apply Vaseline and Band-Aid daily until healed.  

## 2020-04-23 ENCOUNTER — Other Ambulatory Visit: Payer: Self-pay | Admitting: Obstetrics & Gynecology

## 2020-04-23 DIAGNOSIS — Z1231 Encounter for screening mammogram for malignant neoplasm of breast: Secondary | ICD-10-CM

## 2020-05-26 ENCOUNTER — Ambulatory Visit (INDEPENDENT_AMBULATORY_CARE_PROVIDER_SITE_OTHER): Payer: BC Managed Care – PPO | Admitting: Nurse Practitioner

## 2020-05-26 ENCOUNTER — Encounter: Payer: Self-pay | Admitting: Nurse Practitioner

## 2020-05-26 ENCOUNTER — Other Ambulatory Visit: Payer: Self-pay

## 2020-05-26 VITALS — BP 128/86 | HR 87 | Temp 98.4°F | Wt 177.8 lb

## 2020-05-26 DIAGNOSIS — F419 Anxiety disorder, unspecified: Secondary | ICD-10-CM

## 2020-05-26 MED ORDER — HYDROXYZINE HCL 10 MG PO TABS: 10.0000 mg | ORAL_TABLET | Freq: Every evening | ORAL | 3 refills | Status: DC | PRN

## 2020-05-26 MED ORDER — AMITRIPTYLINE HCL 50 MG PO TABS: 25.0000 mg | ORAL_TABLET | Freq: Every evening | ORAL | 3 refills | Status: DC | PRN

## 2020-05-26 NOTE — Assessment & Plan Note (Addendum)
Chronic, exacerbated due to life events uses Amitriptyline (for sleep and mood) + occasional Vistaril.  Refills sent.  Denies SI/HI.  Continue current medication regimen and adjust as needed.  Referral to psychiatry per patient request to further discuss medications, would also benefit from therapy.  Return as scheduled in January.

## 2020-05-26 NOTE — Patient Instructions (Addendum)
Address: [address], [place], [place] [address] Hours:  Open ? Closes 5PM Phone: [phone]  Ashwagandha supplement  Managing Anxiety, Adult After being diagnosed with an anxiety disorder, you may be relieved to know why you have felt or behaved a certain way. You may also feel overwhelmed about the treatment ahead and what it will mean for your life. With care and support, you can manage this condition and recover from it. How to manage lifestyle changes Managing stress and anxiety  Stress is your body's reaction to life changes and events, both good and bad. Most stress will last just a few hours, but stress can be ongoing and can lead to more than just stress. Although stress can play a major role in anxiety, it is not the same as anxiety. Stress is usually caused by something external, such as a deadline, test, or competition. Stress normally passes after the triggering event has ended.  Anxiety is caused by something internal, such as imagining a terrible outcome or worrying that something will go wrong that will devastate you. Anxiety often does not go away even after the triggering event is over, and it can become long-term (chronic) worry. It is important to understand the differences between stress and anxiety and to manage your stress effectively so that it does not lead to an anxious response. Talk with your health care provider or a counselor to learn more about reducing anxiety and stress. He or she may suggest tension reduction techniques, such as:  Music therapy. This can include creating or listening to music that you enjoy and that inspires you.  Mindfulness-based meditation. This involves being aware of your normal breaths while not trying to control your breathing. It can be done while sitting or walking.  Centering prayer. This involves focusing on a word, phrase, or sacred image that means something to you and brings you peace.  Deep breathing. To do this,  expand your stomach and inhale slowly through your nose. Hold your breath for 3-5 seconds. Then exhale slowly, letting your stomach muscles relax.  Self-talk. This involves identifying thought patterns that lead to anxiety reactions and changing those patterns.  Muscle relaxation. This involves tensing muscles and then relaxing them. Choose a tension reduction technique that suits your lifestyle and personality. These techniques take time and practice. Set aside 5-15 minutes a day to do them. Therapists can offer counseling and training in these techniques. The training to help with anxiety may be covered by some insurance plans. Other things you can do to manage stress and anxiety include:  Keeping a stress/anxiety diary. This can help you learn what triggers your reaction and then learn ways to manage your response.  Thinking about how you react to certain situations. You may not be able to control everything, but you can control your response.  Making time for activities that help you relax and not feeling guilty about spending your time in this way.  Visual imagery and yoga can help you stay calm and relax.  Medicines Medicines can help ease symptoms. Medicines for anxiety include:  Anti-anxiety drugs.  Antidepressants. Medicines are often used as a primary treatment for anxiety disorder. Medicines will be prescribed by a health care provider. When used together, medicines, psychotherapy, and tension reduction techniques may be the most effective treatment. Relationships Relationships can play a big part in helping you recover. Try to spend more time connecting with trusted friends and family members. Consider going to couples counseling, taking family education  classes, or going to family therapy. Therapy can help you and others better understand your condition. How to recognize changes in your anxiety Everyone responds differently to treatment for anxiety. Recovery from anxiety happens  when symptoms decrease and stop interfering with your daily activities at home or work. This may mean that you will start to:  Have better concentration and focus. Worry will interfere less in your daily thinking.  Sleep better.  Be less irritable.  Have more energy.  Have improved memory. It is important to recognize when your condition is getting worse. Contact your health care provider if your symptoms interfere with home or work and you feel like your condition is not improving. Follow these instructions at home: Activity  Exercise. Most adults should do the following: ? Exercise for at least 150 minutes each week. The exercise should increase your heart rate and make you sweat (moderate-intensity exercise). ? Strengthening exercises at least twice a week.  Get the right amount and quality of sleep. Most adults need 7-9 hours of sleep each night. Lifestyle   Eat a healthy diet that includes plenty of vegetables, fruits, whole grains, low-fat dairy products, and lean protein. Do not eat a lot of foods that are high in solid fats, added sugars, or salt.  Make choices that simplify your life.  Do not use any products that contain nicotine or tobacco, such as cigarettes, e-cigarettes, and chewing tobacco. If you need help quitting, ask your health care provider.  Avoid caffeine, alcohol, and certain over-the-counter cold medicines. These may make you feel worse. Ask your pharmacist which medicines to avoid. General instructions  Take over-the-counter and prescription medicines only as told by your health care provider.  Keep all follow-up visits as told by your health care provider. This is important. Where to find support You can get help and support from these sources:  Self-help groups.  Online and [employer].  A trusted spiritual leader.  Couples counseling.  Family education classes.  Family therapy. Where to find more information You may find that  joining a support group helps you deal with your anxiety. The following sources can help you locate counselors or support groups near you:  [place]: www.mentalhealthamerica.net  Anxiety and Depression Association of [place] (ADAA): https://www.clark.net/  National Alliance on Mental Illness (NAMI): www.nami.org Contact a health care provider if you:  Have a hard time staying focused or finishing daily tasks.  Spend many hours a day feeling worried about everyday life.  Become exhausted by worry.  Start to have headaches, feel tense, or have nausea.  Urinate more than normal.  Have diarrhea. Get help right away if you have:  A racing heart and shortness of breath.  Thoughts of hurting yourself or others. If you ever feel like you may hurt yourself or others, or have thoughts about taking your own life, get help right away. You can go to your nearest emergency department or call:  Your local emergency services (911 in the [place]  A suicide crisis helpline, such as the [facility] at 708-413-3165. This is open 24 hours a day. Summary  Taking steps to learn and use tension reduction techniques can help calm you and help prevent triggering an anxiety reaction.  When used together, medicines, psychotherapy, and tension reduction techniques may be the most effective treatment.  Family, friends, and partners can play a big part in helping you recover from an anxiety disorder. This information is not intended to replace advice given  to you by your health care provider. Make sure you discuss any questions you have with your health care provider. Document Revised: 11/12/2018 Document Reviewed: 11/12/2018 Elsevier Patient Education  2020 [employer].

## 2020-05-26 NOTE — Progress Notes (Signed)
BP 128/86   Pulse 87   Temp 98.4 F (36.9 C)   Wt 177 lb 12.8 oz (80.6 kg)   SpO2 96%   BMI 27.85 kg/m    Subjective:    Patient ID: Carolyn Wood, female    DOB: 1960/09/09, 59 y.o.   MRN: [id number]  HPI: Carolyn Wood is a 59 y.o. female  Chief Complaint  Patient presents with  . [place]esling    pt requesting referall to see therapist, says she has been feeling stressed and depressed  . Insomnia    pt states she is still having difficulty sleeping and staying asleep and is out of sleeping meds   ANXIETY/STRESS Continues on Amitriptyline 25-50 MG and Vistaril as needed.  Takes these for mood and insomnia.  She would like to attend pyschiatry, has done this before which was beneficial.  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  They sent her to [facility] and told her she is under investigation -- struggling with work environment and feeling like she was under a "witch hunt".  Currently taking Amitriptyline and Vistaril for sleep at night, she alternates these -- needs refills.  In past has taken Klonopin.  Her 25 yo daughter has tried to commit suicide 4 times over past year with Covid stressors and school -- struggling with mood. Duration:stable Anxious mood: yes Excessive worrying: yes Irritability: no  Sweating: no Nausea: no Palpitations:no Hyperventilation: no Panic attacks: no Agoraphobia: no  Obscessions/compulsions: no Depressed mood: yes Depression screen [facility] 2/9 05/26/2020 08/13/2019 08/13/2019 07/17/2019 04/17/2018  Decreased Interest 3 1 1  0 1  Down, Depressed, Hopeless 3 0 0 0 1  PHQ - 2 Score 6 1 1  0 2  Altered sleeping 3 1 1  0 3  Tired, decreased energy 3 1 1  0 1  Change in appetite 3 1 0 0 0  Feeling bad or failure about yourself  2 1 1  0 0  Trouble concentrating 3 0 0 0 0  Moving slowly or fidgety/restless 2 0 0 0 0  Suicidal thoughts 0 0 0  0 0  PHQ-9 Score 22 5 4  0 6  Difficult doing work/chores Very difficult - Not difficult at all Not difficult at all -  Anhedonia: no Weight changes: no Insomnia: yes hard to fall asleep  Hypersomnia: no Fatigue/loss of energy: no Feelings of worthlessness: no Feelings of guilt: no Impaired concentration/indecisiveness: yes Suicidal ideations: no  Crying spells: no Recent Stressors/Life Changes: no   Relationship problems: no   Family stress: no     Financial stress: no    Job stress: no    Recent death/loss: no GAD 7 : Generalized Anxiety Score 05/26/2020 08/13/2019 08/13/2019 02/11/2018  Nervous, Anxious, on Edge 3 1 1 3   Control/stop worrying 3 1 1 3   Worry too much - different things 2 1 1 1   Trouble relaxing 3 1 1 3   Restless 2 0 0 1  Easily annoyed or irritable 2 1 1 2   Afraid - awful might happen 3 1 1  0  Total GAD 7 Score 18 6 6 13   Anxiety Difficulty Somewhat difficult Somewhat difficult Somewhat difficult Somewhat difficult    Relevant past medical, surgical, family and social history reviewed and updated as indicated. Interim medical history since our last visit reviewed. Allergies and medications reviewed and updated.  Review of Systems  Constitutional: Negative for  activity change, appetite change, diaphoresis, fatigue and fever.  Respiratory: Negative for cough, chest tightness and shortness of breath.   Cardiovascular: Negative for chest pain, palpitations and leg swelling.  Gastrointestinal: Negative.   Neurological: Negative.   Psychiatric/Behavioral: Negative.     Per HPI unless specifically indicated above     Objective:    BP 128/86   Pulse 87   Temp 98.4 F (36.9 C)   Wt 177 lb 12.8 oz (80.6 kg)   SpO2 96%   BMI 27.85 kg/m   Wt Readings from Last 3 Encounters:  05/26/20 177 lb 12.8 oz (80.6 kg)  10/27/19 182 lb (82.6 kg)  08/13/19 178 lb 12.8 oz (81.1 kg)    Physical Exam Vitals and nursing note reviewed.  Constitutional:      General:  She is awake. She is not in acute distress.    Appearance: She is well-developed. She is not ill-appearing.  HENT:     Head: Normocephalic.     Right Ear: Hearing normal.     Left Ear: Hearing normal.  Eyes:     General: Lids are normal.        Right eye: No discharge.        Left eye: No discharge.     Conjunctiva/sclera: Conjunctivae normal.     Pupils: Pupils are equal, round, and reactive to light.  Neck:     Vascular: No carotid bruit.  Cardiovascular:     Rate and Rhythm: Normal rate and regular rhythm.     Heart sounds: Normal heart sounds. No murmur heard.  No gallop.   Pulmonary:     Effort: Pulmonary effort is normal. No accessory muscle usage or respiratory distress.     Breath sounds: Normal breath sounds.  Abdominal:     General: Bowel sounds are normal.     Palpations: Abdomen is soft.  Musculoskeletal:     Cervical back: Normal range of motion and neck supple.     Right lower leg: No edema.     Left lower leg: No edema.  Skin:    General: Skin is warm and dry.  Neurological:     Mental Status: She is alert and oriented to person, place, and time.  Psychiatric:        Attention and Perception: Attention normal.        Mood and Affect: Mood normal.        Behavior: Behavior normal. Behavior is cooperative.        Thought Content: Thought content normal.        Judgment: Judgment normal.     Results for orders placed or performed in visit on 10/27/19  Fecal occult blood, imunochemical   Specimen: STOOL   STOOL  Result Value Ref Range   Fecal Occult Bld Negative Negative  Specimen status report  Result Value Ref Range   specimen status report Comment   Cytology - PAP  Result Value Ref Range   High risk HPV Negative    Adequacy      Satisfactory for evaluation; transformation zone component PRESENT.   Diagnosis      - Negative for intraepithelial lesion or malignancy (NILM)   Comment Normal Reference Range HPV - Negative       Assessment & Plan:     Problem List Items Addressed This Visit      Other   Anxiety - Primary    Chronic, exacerbated due to life events uses Amitriptyline (for sleep and mood) +  occasional Vistaril.  Refills sent.  Denies SI/HI.  Continue current medication regimen and adjust as needed.  Referral to psychiatry per patient request to further discuss medications, would also benefit from therapy.  Return as scheduled in January.      Relevant Medications   hydrOXYzine (ATARAX/VISTARIL) 10 MG tablet   amitriptyline (ELAVIL) 50 MG tablet   Other Relevant Orders   Ambulatory referral to Psychiatry       Follow up plan: Return for as scheduled in January.

## 2020-11-02 ENCOUNTER — Other Ambulatory Visit: Payer: Self-pay | Admitting: Internal Medicine

## 2020-11-02 DIAGNOSIS — I10 Essential (primary) hypertension: Secondary | ICD-10-CM

## 2020-11-02 MED ORDER — AMLODIPINE-OLMESARTAN 5-40 MG PO TABS: 1.0000 | ORAL_TABLET | Freq: Every day | ORAL | 0 refills | Status: DC

## 2020-11-02 NOTE — Telephone Encounter (Signed)
Routing to provider  

## 2020-11-02 NOTE — Telephone Encounter (Signed)
Pt has apt on 11/10/2020 

## 2020-11-02 NOTE — Telephone Encounter (Signed)
  Notes to clinic:  Patient has appt on 11/10/2020 with new provider Review for 90 day supply   Requested Prescriptions  Pending Prescriptions Disp Refills   amLODipine-olmesartan (AZOR) 5-40 MG tablet 90 tablet 0    Sig: Take 1 tablet by mouth daily.      Cardiovascular: CCB + ARB Combos Failed - 11/02/2020 10:14 AM      Failed - K in normal range and within 180 days    Potassium  Date Value Ref Range Status  08/13/2019 4.1 3.5 - 5.2 mmol/L Final          Failed - Cr in normal range and within 180 days    Creatinine, Ser  Date Value Ref Range Status  08/13/2019 0.72 0.57 - 1.00 mg/dL Final          Passed - Patient is not pregnant      Passed - Last BP in normal range    BP Readings from Last 1 Encounters:  05/26/20 128/86          Passed - Valid encounter within last 6 months    Recent Outpatient Visits           5 months ago Anxiety   [facility] [place], Corrie Dandy T, NP   1 year ago Annual physical exam   [facility] [place], Dorie Rank, NP   1 year ago Essential hypertension   [facility] [place], [place] T, NP   1 year ago Varicose veins of leg with pain, bilateral   [facility] [place], Dorie Rank, NP   2 years ago Annual physical exam   [facility] [place], Redge Gainer, MD       Future Appointments             In 1 week Larae Grooms, NP [facility]

## 2020-11-02 NOTE — Telephone Encounter (Signed)
Medication Refill - Medication: amLODipine-olmesartan (AZOR) 5-40 MG tablet    Has the patient contacted their pharmacy? Yes.    (Agent: If yes, when and what did the pharmacy advise?) no refills, contact PCP office  Preferred Pharmacy (with phone number or street name):   [id number] - [place], [place] - [address] Phone:  [phone]  Fax:  [phone]       Agent: Please be advised that RX refills may take up to 3 business days. We ask that you follow-up with your pharmacy.

## 2020-11-10 ENCOUNTER — Other Ambulatory Visit: Payer: Self-pay

## 2020-11-10 ENCOUNTER — Encounter: Payer: Self-pay | Admitting: Nurse Practitioner

## 2020-11-10 ENCOUNTER — Ambulatory Visit (INDEPENDENT_AMBULATORY_CARE_PROVIDER_SITE_OTHER): Payer: BC Managed Care – PPO | Admitting: Nurse Practitioner

## 2020-11-10 VITALS — BP 138/82 | HR 78 | Temp 97.1°F | Ht 65.16 in | Wt 162.4 lb

## 2020-11-10 DIAGNOSIS — Z0000 Encounter for general adult medical examination without abnormal findings: Secondary | ICD-10-CM

## 2020-11-10 DIAGNOSIS — I10 Essential (primary) hypertension: Secondary | ICD-10-CM

## 2020-11-10 DIAGNOSIS — F419 Anxiety disorder, unspecified: Secondary | ICD-10-CM

## 2020-11-10 DIAGNOSIS — E042 Nontoxic multinodular goiter: Secondary | ICD-10-CM

## 2020-11-10 DIAGNOSIS — E7800 Pure hypercholesterolemia, unspecified: Secondary | ICD-10-CM

## 2020-11-10 DIAGNOSIS — E559 Vitamin D deficiency, unspecified: Secondary | ICD-10-CM

## 2020-11-10 LAB — URINALYSIS, ROUTINE W REFLEX MICROSCOPIC
Bilirubin, UA: NEGATIVE
Glucose, UA: NEGATIVE
Ketones, UA: NEGATIVE
Leukocytes,UA: NEGATIVE
Nitrite, UA: NEGATIVE
Specific Gravity, UA: >1.03 — ABNORMAL HIGH (ref 1.005–1.030)
Urobilinogen, Ur: 0.2 mg/dL (ref 0.2–1.0)
pH, UA: 6 (ref 5.0–7.5)

## 2020-11-10 LAB — MICROSCOPIC EXAMINATION: WBC, UA: NONE SEEN /hpf (ref 0–5)

## 2020-11-10 MED ORDER — AMLODIPINE-OLMESARTAN 5-40 MG PO TABS: 1.0000 | ORAL_TABLET | Freq: Every day | ORAL | 1 refills | Status: DC

## 2020-11-10 NOTE — Assessment & Plan Note (Signed)
Chronic, ongoing with BP at goal.  Continue current medication regimen and adjust as needed.   Recommend she check BP at home at least 3 mornings a week.  Labs today.  Return in 6 months.

## 2020-11-10 NOTE — Assessment & Plan Note (Signed)
Check TSH today

## 2020-11-10 NOTE — Progress Notes (Signed)
BP 138/82   Pulse 78   Temp (!) 97.1 F (36.2 C)   Ht 5' 5.16" (1.655 m)   Wt 162 lb 6 oz (73.7 kg)   SpO2 97%   BMI 26.89 kg/m    Subjective:    Patient ID: Carolyn Wood, female    DOB: 04-Mar-1961, 60 y.o.   MRN: [id number]  HPI: Carolyn Wood is a 60 y.o. female presenting on 11/10/2020 for comprehensive medical examination. Current medical complaints include:none  She currently lives with: Herself  Menopausal Symptoms: no  HYPERTENSION Hypertension status: controlled  Satisfied with current treatment? no Duration of hypertension: years BP monitoring frequency:  not checking BP range:  BP medication side effects:  no Medication compliance: excellent compliance Previous BP meds:amlodipine and olmesartan (benicar) Aspirin: no Recurrent headaches: no Visual changes: no Palpitations: no Dyspnea: no Chest pain: no Lower extremity edema: no Dizzy/lightheaded: no  Depression Screen done today and results listed below:  Depression screen [facility] 2/9 11/10/2020 05/26/2020 08/13/2019 08/13/2019 07/17/2019  Decreased Interest 0 3 1 1  0  Down, Depressed, Hopeless 0 3 0 0 0  PHQ - 2 Score 0 6 1 1  0  Altered sleeping - 3 1 1  0  Tired, decreased energy - 3 1 1  0  Change in appetite - 3 1 0 0  Feeling bad or failure about yourself  - 2 1 1  0  Trouble concentrating - 3 0 0 0  Moving slowly or fidgety/restless - 2 0 0 0  Suicidal thoughts - 0 0 0 0  PHQ-9 Score - 22 5 4  0  Difficult doing work/chores - Very difficult - Not difficult at all Not difficult at all    The patient does not have a history of falls. I did complete a risk assessment for falls. A plan of care for falls was not documented.   Past Medical History:  Past Medical History:  Diagnosis Date  . Anxiety   . Arthritis   . Complication of anesthesia    combative when waking up  . Elevated liver enzymes   . History of Papanicolaou smear of cervix 11/05/2013   RNIL/NEG  . HTN (hypertension)   .  Seasonal allergies   . Synovial cyst of lumbar spine     Surgical History:  Past Surgical History:  Procedure Laterality Date  . ABDOMINAL HYSTERECTOMY    . AUGMENTATION MAMMAPLASTY Bilateral 2015   silicone  . BUNIONECTOMY Bilateral   . CESAREAN SECTION    . chin implant    . cyst removed     tail bone  . JOINT REPLACEMENT Right    hip  . KNEE ARTHROSCOPY Right 1989   "dont know what they did"  . LIPOSUCTION     on neck and chin  . NASAL SEPTUM SURGERY Right 07/2016  . NECK SURGERY  05/26/2019  . TONSILLECTOMY    . 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 TOTAL HIP ARTHROPLASTY Left 06/04/2018   Procedure: LEFT TOTAL HIP ARTHROPLASTY ANTERIOR APPROACH;  Surgeon: 11/07/2013, MD;  Location: [facility];  Service: Orthopedics;  Laterality: Left;  70 mins    Medications:  Current Outpatient Medications on File Prior to Visit  Medication Sig  . celecoxib (CELEBREX) 200 MG capsule Take 100 mg by mouth daily.   . traMADol (ULTRAM) 50 MG tablet Take by mouth every  6 (six) hours as needed.  Marland Kitchen. MAGNESIUM PO 1 tablet daily. (Patient not taking: Reported on 11/10/2020)   No current facility-administered medications on file prior to visit.    Allergies:  No Known Allergies  Social History:  Social History   Socioeconomic History  . Marital status: Single    Spouse name: Not on file  . Number of children: Not on file  . Years of education: Not on file  . Highest education level: Not on file  Occupational History  . Not on file  Tobacco Use  . Smoking status: Former Smoker    Packs/day: 0.50    Years: 20.00    Pack years: 10.00    Types: Cigarettes, Cigars  . Smokeless tobacco: Never Used  Vaping Use  . Vaping Use: Never used  Substance and Sexual Activity  . Alcohol use: Not Currently  . Drug use: No  . Sexual  activity: Yes  Other Topics Concern  . Not on file  Social History Narrative  . Not on file   Social Determinants of Health   Financial Resource Strain: Not on file  Food Insecurity: Not on file  Transportation Needs: Not on file  Physical Activity: Not on file  Stress: Not on file  Social Connections: Not on file  Intimate Partner Violence: Not on file   Social History   Tobacco Use  Smoking Status Former Smoker  . Packs/day: 0.50  . Years: 20.00  . Pack years: 10.00  . Types: Cigarettes, Cigars  Smokeless Tobacco Never Used   Social History   Substance and Sexual Activity  Alcohol Use Not Currently    Family History:  Family History  Problem Relation Age of Onset  . Cancer Mother        kidney  . Diabetes Mother   . Hypertension Mother   . Breast cancer Neg Hx   . Ovarian cancer Neg Hx     Past medical history, surgical history, medications, allergies, family history and social history reviewed with patient today and changes made to appropriate areas of the chart.   Review of Systems  Eyes: Negative for blurred vision and double vision.  Respiratory: Negative for shortness of breath.   Cardiovascular: Negative for chest pain, palpitations and leg swelling.  Neurological: Negative for dizziness and headaches.   All other ROS negative except what is listed above and in the HPI.      Objective:    BP 138/82   Pulse 78   Temp (!) 97.1 F (36.2 C)   Ht 5' 5.16" (1.655 m)   Wt 162 lb 6 oz (73.7 kg)   SpO2 97%   BMI 26.89 kg/m   Wt Readings from Last 3 Encounters:  11/10/20 162 lb 6 oz (73.7 kg)  05/26/20 177 lb 12.8 oz (80.6 kg)  10/27/19 182 lb (82.6 kg)    Physical Exam Vitals and nursing note reviewed.  Constitutional:      General: She is awake. She is not in acute distress.    Appearance: She is well-developed. She is not ill-appearing.  HENT:     Head: Normocephalic and atraumatic.     Right Ear: Hearing, tympanic membrane, ear canal and  external ear normal. No drainage.     Left Ear: Hearing, tympanic membrane, ear canal and external ear normal. No drainage.     Nose: Nose normal.     Right Sinus: No maxillary sinus tenderness or frontal sinus tenderness.     Left Sinus: No  maxillary sinus tenderness or frontal sinus tenderness.     Mouth/Throat:     Mouth: Mucous membranes are moist.     Pharynx: Oropharynx is clear. Uvula midline. No pharyngeal swelling, oropharyngeal exudate or posterior oropharyngeal erythema.  Eyes:     General: Lids are normal.        Right eye: No discharge.        Left eye: No discharge.     Extraocular Movements: Extraocular movements intact.     Conjunctiva/sclera: Conjunctivae normal.     Pupils: Pupils are equal, round, and reactive to light.     Visual Fields: Right eye visual fields normal and left eye visual fields normal.  Neck:     Thyroid: No thyromegaly.     Vascular: No carotid bruit.     Trachea: Trachea normal.  Cardiovascular:     Rate and Rhythm: Normal rate and regular rhythm.     Heart sounds: Normal heart sounds. No murmur heard. No gallop.   Pulmonary:     Effort: Pulmonary effort is normal. No accessory muscle usage or respiratory distress.     Breath sounds: Normal breath sounds.  Chest:  Breasts:     Right: Normal. No axillary adenopathy or supraclavicular adenopathy.     Left: Normal. No axillary adenopathy or supraclavicular adenopathy.    Abdominal:     General: Bowel sounds are normal.     Palpations: Abdomen is soft. There is no hepatomegaly or splenomegaly.     Tenderness: There is no abdominal tenderness.  Musculoskeletal:        General: Normal range of motion.     Cervical back: Normal range of motion and neck supple.     Right lower leg: No edema.     Left lower leg: No edema.  Lymphadenopathy:     Head:     Right side of head: No submental, submandibular, tonsillar, preauricular or posterior auricular adenopathy.     Left side of head: No  submental, submandibular, tonsillar, preauricular or posterior auricular adenopathy.     Cervical: No cervical adenopathy.     Upper Body:     Right upper body: No supraclavicular, axillary or pectoral adenopathy.     Left upper body: No supraclavicular, axillary or pectoral adenopathy.  Skin:    General: Skin is warm and dry.     Capillary Refill: Capillary refill takes less than 2 seconds.     Findings: No rash.  Neurological:     Mental Status: She is alert and oriented to person, place, and time.     Cranial Nerves: Cranial nerves are intact.     Gait: Gait is intact.     Deep Tendon Reflexes: Reflexes are normal and symmetric.     Reflex Scores:      Brachioradialis reflexes are 2+ on the right side and 2+ on the left side.      Patellar reflexes are 2+ on the right side and 2+ on the left side. Psychiatric:        Attention and Perception: Attention normal.        Mood and Affect: Mood normal.        Speech: Speech normal.        Behavior: Behavior normal. Behavior is cooperative.        Thought Content: Thought content normal.        Judgment: Judgment normal.     Results for orders placed or performed in visit on 10/27/19  Fecal occult  blood, imunochemical   Specimen: STOOL   STOOL  Result Value Ref Range   Fecal Occult Bld Negative Negative  Specimen status report  Result Value Ref Range   specimen status report Comment   Cytology - PAP  Result Value Ref Range   High risk HPV Negative    Adequacy      Satisfactory for evaluation; transformation zone component PRESENT.   Diagnosis      - Negative for intraepithelial lesion or malignancy (NILM)   Comment Normal Reference Range HPV - Negative       Assessment & Plan:   Problem List Items Addressed This Visit      Cardiovascular and Mediastinum   Essential hypertension - Primary    Chronic, ongoing with BP at goal.  Continue current medication regimen and adjust as needed.   Recommend she check BP at home at  least 3 mornings a week.  Labs today.  Return in 6 months.      Relevant Medications   amLODipine-olmesartan (AZOR) 5-40 MG tablet   Other Relevant Orders   Lipid panel     Endocrine   Multinodular goiter    Check TSH today.      Relevant Orders   CBC with Differential/Platelet   TSH     Other   Anxiety    Chronic.  Controlled at this time off of medication.  Patient does not feel like she needs medication at this time.  Follow up in 6 months.      Elevated LDL cholesterol level    Labs ordered today.  Patient has lost 20lbs in the last year. Will make recommendations based on lab results.       Other Visit Diagnoses    Annual physical exam       Relevant Orders   CBC with Differential/Platelet   Comprehensive metabolic panel   Lipid panel   TSH   Urinalysis, Routine w reflex microscopic   Hypercholesteremia       Relevant Medications   amLODipine-olmesartan (AZOR) 5-40 MG tablet   Other Relevant Orders   Lipid panel   Vitamin D deficiency       Relevant Orders   Vitamin D (25 hydroxy)       Follow up plan: Return in about 6 months (around 05/13/2021) for Physical and Fasting labs.   LABORATORY TESTING:  - Pap smear: up to date  IMMUNIZATIONS:   - Tdap: Tetanus vaccination status reviewed: last tetanus booster within 10 years. - Influenza: Up to date - Pneumovax: Not applicable - Prevnar: Not applicable - HPV: Not applicable - Zostavax vaccine: Refused  SCREENING: -Mammogram: Up to date  - Colonoscopy: Up to date  - Bone Density: Not applicable  -Hearing Test: Not applicable  -Spirometry: Not applicable   PATIENT COUNSELING:   Advised to take 1 mg of folate supplement per day if capable of pregnancy.   Sexuality: Discussed sexually transmitted diseases, partner selection, use of condoms, avoidance of unintended pregnancy  and contraceptive alternatives.   Advised to avoid cigarette smoking.  I discussed with the patient that most people  either abstain from alcohol or drink within safe limits (<=14/week and <=4 drinks/occasion for males, <=7/weeks and <= 3 drinks/occasion for females) and that the risk for alcohol disorders and other health effects rises proportionally with the number of drinks per week and how often a drinker exceeds daily limits.  Discussed cessation/primary prevention of drug use and availability of treatment for abuse.   Diet: Encouraged  to adjust caloric intake to maintain  or achieve ideal body weight, to reduce intake of dietary saturated fat and total fat, to limit sodium intake by avoiding high sodium foods and not adding table salt, and to maintain adequate dietary potassium and calcium preferably from fresh fruits, vegetables, and low-fat dairy products.    stressed the importance of regular exercise  Injury prevention: Discussed safety belts, safety helmets, smoke detector, smoking near bedding or upholstery.   Dental health: Discussed importance of regular tooth brushing, flossing, and dental visits.    NEXT PREVENTATIVE PHYSICAL DUE IN 1 YEAR. Return in about 6 months (around 05/13/2021) for Physical and Fasting labs.

## 2020-11-10 NOTE — Assessment & Plan Note (Signed)
Chronic.  Controlled at this time off of medication.  Patient does not feel like she needs medication at this time.  Follow up in 6 months.

## 2020-11-10 NOTE — Assessment & Plan Note (Signed)
Labs ordered today.  Patient has lost 20lbs in the last year. Will make recommendations based on lab results.

## 2020-11-11 LAB — COMPREHENSIVE METABOLIC PANEL WITH GFR
ALT: 25 IU/L (ref 0–32)
AST: 32 IU/L (ref 0–40)
Albumin/Globulin Ratio: 2.1 (ref 1.2–2.2)
Albumin: 4.6 g/dL (ref 3.8–4.9)
Alkaline Phosphatase: 58 IU/L (ref 44–121)
BUN/Creatinine Ratio: 24 — ABNORMAL HIGH (ref 9–23)
BUN: 16 mg/dL (ref 6–24)
Bilirubin Total: 0.3 mg/dL (ref 0.0–1.2)
CO2: 23 mmol/L (ref 20–29)
Calcium: 9.2 mg/dL (ref 8.7–10.2)
Chloride: 101 mmol/L (ref 96–106)
Creatinine, Ser: 0.66 mg/dL (ref 0.57–1.00)
Globulin, Total: 2.2 g/dL (ref 1.5–4.5)
Glucose: 102 mg/dL — ABNORMAL HIGH (ref 65–99)
Potassium: 4.4 mmol/L (ref 3.5–5.2)
Sodium: 138 mmol/L (ref 134–144)
Total Protein: 6.8 g/dL (ref 6.0–8.5)
eGFR: 102 mL/min/1.73 (ref >59)

## 2020-11-11 LAB — CBC WITH DIFFERENTIAL/PLATELET
Basophils Absolute: 0.1 10*3/uL (ref 0.0–0.2)
Basos: 1 %
EOS (ABSOLUTE): 0.1 10*3/uL (ref 0.0–0.4)
Eos: 2 %
Hematocrit: 36.8 % (ref 34.0–46.6)
Hemoglobin: 12.3 g/dL (ref 11.1–15.9)
Immature Grans (Abs): 0 10*3/uL (ref 0.0–0.1)
Immature Granulocytes: 0 %
Lymphocytes Absolute: 3.2 10*3/uL — ABNORMAL HIGH (ref 0.7–3.1)
Lymphs: 43 %
MCH: 30.4 pg (ref 26.6–33.0)
MCHC: 33.4 g/dL (ref 31.5–35.7)
MCV: 91 fL (ref 79–97)
Monocytes Absolute: 0.6 10*3/uL (ref 0.1–0.9)
Monocytes: 7 %
Neutrophils Absolute: 3.6 10*3/uL (ref 1.4–7.0)
Neutrophils: 47 %
Platelets: 316 10*3/uL (ref 150–450)
RBC: 4.05 x10E6/uL (ref 3.77–5.28)
RDW: 13.1 % (ref 11.7–15.4)
WBC: 7.5 10*3/uL (ref 3.4–10.8)

## 2020-11-11 LAB — LIPID PANEL
Chol/HDL Ratio: 3.3 ratio (ref 0.0–4.4)
Cholesterol, Total: 260 mg/dL — ABNORMAL HIGH (ref 100–199)
HDL: 79 mg/dL (ref >39)
LDL Chol Calc (NIH): 172 mg/dL — ABNORMAL HIGH (ref 0–99)
Triglycerides: 57 mg/dL (ref 0–149)
VLDL Cholesterol Cal: 9 mg/dL (ref 5–40)

## 2020-11-11 LAB — TSH: TSH: 2.25 u[IU]/mL (ref 0.450–4.500)

## 2020-11-11 LAB — VITAMIN D 25 HYDROXY (VIT D DEFICIENCY, FRACTURES): Vit D, 25-Hydroxy: 40.3 ng/mL (ref 30.0–100.0)

## 2020-11-11 NOTE — Progress Notes (Signed)
Hi Carolyn Wood.  It was nice to meet you yesterday.  Your lab work looks good. Your complete blood count is normal. Your glucose is a little elevated.  We will continue to monitor this at future appointments.  Your cholesterol remains elevated.  However your cardiac risk score as detailed below remains in the low risk range.  We don't need medication at this time.  We will continue to monitor at future appointments.  The 10-year ASCVD risk score Denman George DC Montez Hageman., et al., 2013) is: 3.6%   Values used to calculate the score:     Age: 60 years     Sex: Female     Is Non-Hispanic African American: No     Diabetic: No     Tobacco smoker: No     Systolic Blood Pressure: 138 mmHg     Is BP treated: Yes     HDL Cholesterol: 79 mg/dL     Total Cholesterol: 260 mg/dL  Your thyroid is within normal range.  Your urine did show some protein which I believe is because you are only eating once a day.  Please let me know if you have any questions. I would like to recheck your lab work in 6 months. See you then.

## 2021-01-19 ENCOUNTER — Encounter: Payer: Self-pay | Admitting: Nurse Practitioner

## 2021-01-19 ENCOUNTER — Other Ambulatory Visit: Payer: Self-pay

## 2021-01-19 ENCOUNTER — Ambulatory Visit: Payer: BC Managed Care – PPO | Admitting: Nurse Practitioner

## 2021-01-19 VITALS — BP 130/86 | HR 78 | Temp 98.3°F | Ht 65.28 in | Wt 160.0 lb

## 2021-01-19 DIAGNOSIS — J342 Deviated nasal septum: Secondary | ICD-10-CM | POA: Diagnosis not present

## 2021-01-19 DIAGNOSIS — E042 Nontoxic multinodular goiter: Secondary | ICD-10-CM

## 2021-01-19 DIAGNOSIS — E7800 Pure hypercholesterolemia, unspecified: Secondary | ICD-10-CM

## 2021-01-19 DIAGNOSIS — Z01818 Encounter for other preprocedural examination: Secondary | ICD-10-CM

## 2021-01-19 DIAGNOSIS — E663 Overweight: Secondary | ICD-10-CM

## 2021-01-19 DIAGNOSIS — I10 Essential (primary) hypertension: Secondary | ICD-10-CM

## 2021-01-19 DIAGNOSIS — F419 Anxiety disorder, unspecified: Secondary | ICD-10-CM

## 2021-01-19 LAB — URINALYSIS, ROUTINE W REFLEX MICROSCOPIC
Bilirubin, UA: NEGATIVE
Glucose, UA: NEGATIVE
Ketones, UA: NEGATIVE
Leukocytes,UA: NEGATIVE
Nitrite, UA: NEGATIVE
Protein,UA: NEGATIVE
Specific Gravity, UA: 1.025 (ref 1.005–1.030)
Urobilinogen, Ur: 0.2 mg/dL (ref 0.2–1.0)
pH, UA: 5.5 (ref 5.0–7.5)

## 2021-01-19 LAB — MICROSCOPIC EXAMINATION
Epithelial Cells (non renal): NONE SEEN /hpf (ref 0–10)
WBC, UA: NONE SEEN /hpf (ref 0–5)

## 2021-01-19 NOTE — Progress Notes (Signed)
Inverted P waves. Discussed with aptient in office. Similar to prior EKG. Will need Cardiac clearance.

## 2021-01-19 NOTE — Assessment & Plan Note (Signed)
Chronic.  Controlled.  Continue with current medication regimen.  Labs stable. 

## 2021-01-19 NOTE — Assessment & Plan Note (Signed)
Controlled.  No concerns at this time.

## 2021-01-19 NOTE — Progress Notes (Addendum)
g  BP 130/86   Pulse 78   Temp 98.3 F (36.8 C)   Ht 5' 5.28" (1.658 m)   Wt 160 lb (72.6 kg)   SpO2 95%   BMI 26.40 kg/m    Subjective:    Patient ID: Carolyn Wood, female    DOB: 08/20/1960, 60 y.o.   MRN: [id number]  HPI: Carolyn Wood is a 60 y.o. female  Chief Complaint  Patient presents with   surgical clearance    01/19/2021  Assessment:  Preoperative evaluation and clearance show: Will need Cardiac Clearance and patient is cleared for diagnosis for surgical procedure.  Other diagnoses affecting surgical risk include: Other Back pain.   Type of Surgery:  Intermediate, 1% - 5% cardiac risk (major intraabdominal, intrathoracic, orthopedic, major head & neck, prostatectomy)   Lee's Revised Cardiac Index: 0 Risk class I, very low, 0.4% risk of cardiac complications   Plan:  1. Patient requires endocarditis prophylaxis: no. 2. GENERAL PREOP INSTRUCTIONS: Proceed with surgery as planned. No food or liquids the morning of surgery. Call surgeon if develops respiratory illness, fever, or other illness. 3. Medications to Hold: NSAIDS for 7 days before surgery, unless instructed otherwise by surgeon. 4. EKG:  01/19/2021 : Showed depressed P wave. Patient sent to Cardiology for clearance. 5. Ordered Labs: CBC, CMP, Lipid, TSH, UA  From a medical standpoint the patient is an acceptable candidates to undergo general anesthesia for surgery. t is recommended to correct electrolytes and to keep the patient euvolemic and avoid significant fluid shifts during surgery.  Labs reviewed and pt is cleared for surgery. Pt denies a hx of adverse reactions to anesthesia.    Carolyn Wood is a 60 y.o. female who presents to the office today for a preoperative consultation at the request of Dr. Tonita Cong, who will perform a  Laminectomy Decompression.  Current Complaints: Back pain  Past Medical History:  Diagnosis Date   Anxiety    Arthritis    Complication of  anesthesia    combative when waking up   Elevated liver enzymes    History of Papanicolaou smear of cervix 11/05/2013   RNIL/NEG   HTN (hypertension)    Seasonal allergies    Synovial cyst of lumbar spine    Family History  Problem Relation Age of Onset   Cancer Mother        kidney   Diabetes Mother    Hypertension Mother    Breast cancer Neg Hx    Ovarian cancer Neg Hx    Current Outpatient Medications  Medication Sig Dispense Refill   amLODipine-olmesartan (AZOR) 5-40 MG tablet Take 1 tablet by mouth daily. 90 tablet 1   celecoxib (CELEBREX) 200 MG capsule Take 100 mg by mouth daily.      traMADol (ULTRAM) 50 MG tablet Take by mouth every 6 (six) hours as needed.     No current facility-administered medications for this visit.   No Known Allergies Social History   Socioeconomic History   Marital status: Single    Spouse name: Not on file   Number of children: Not on file   Years of education: Not on file   Highest education level: Not on file  Occupational History   Not on file  Tobacco Use   Smoking status: Former    Packs/day: 0.50    Years: 20.00    Pack years: 10.00    Types: Cigarettes, Cigars   Smokeless tobacco: Never  Vaping Use   Vaping Use: Never used  Substance and Sexual Activity   Alcohol use: Not Currently   Drug use: No   Sexual activity: Yes  Other Topics Concern   Not on file  Social History Narrative   Not on file   Social Determinants of Health   Financial Resource Strain: Not on file  Food Insecurity: Not on file  Transportation Needs: Not on file  Physical Activity: Not on file  Stress: Not on file  Social Connections: Not on file  Intimate Partner Violence: Not on file    Preoperative Risk Factors  1. Major predictors that require intensive management and may lead to delay in or cancellation of the operative procedure unless emergent:  No Unstable coronary syndromes including unstable or severe angina or recent MI  No  Decompensated heart failure including NYHA functional class 4 or worsening or new onset HF  No Significant arrhythmia including high grade AV block, symptomatic ventricular arrhythmias, supraventricular arrhythmias with a ventricular rate >100 bpm at rest, symptomatic bradycardia, and newly recognized ventricular tachycardia  No Severe heart valve disease including severe aortic stenosis or symptomatic mitral stenosis  2. Additional independent predictors of major cardiac complications:  No Hgh risk type of surgery (Vascular surgery and any open intraperitoneal or intrathoracic procedures)  Other clinical predictors that warrant careful assessment of current status  No History of ischemic heart disease NO History of CVA NO History of compensated heart failure or prior heart failure NO Diabetes mellitus on Insulin NO Renal insufficiency  3. Perioperative cardiac and long term risk is increased in pts unable to meet a 4-MET demand during most normal daily activities:   Yes Ability to climb 2 flights of stairs or walk four blocks   BP 130/86   Pulse 78   Temp 98.3 F (36.8 C)   Ht 5' 5.28" (1.658 m)   Wt 160 lb (72.6 kg)   SpO2 95%   BMI 26.40 kg/m   Body mass index is 26.4 kg/m.    Jon Billings 01/19/21  Relevant past medical, surgical, family and social history reviewed and updated as indicated. Interim medical history since our last visit reviewed. Allergies and medications reviewed and updated.  Review of Systems  Eyes:  Negative for visual disturbance.  Respiratory:  Negative for cough, chest tightness and shortness of breath.   Cardiovascular:  Negative for chest pain, palpitations and leg swelling.  Neurological:  Negative for dizziness and headaches.   Per HPI unless specifically indicated above     Objective:    BP 130/86   Pulse 78   Temp 98.3 F (36.8 C)   Ht 5' 5.28" (1.658 m)   Wt 160 lb (72.6 kg)   SpO2 95%   BMI 26.40 kg/m   Wt Readings  from Last 3 Encounters:  01/19/21 160 lb (72.6 kg)  11/10/20 162 lb 6 oz (73.7 kg)  05/26/20 177 lb 12.8 oz (80.6 kg)    Physical Exam Vitals and nursing note reviewed.  Constitutional:      General: She is awake. She is not in acute distress.    Appearance: Normal appearance. She is well-developed and normal weight. She is not ill-appearing, toxic-appearing or diaphoretic.  HENT:     Head: Normocephalic and atraumatic.     Right Ear: Hearing, tympanic membrane, ear canal and external ear normal. No drainage.     Left Ear: Hearing, tympanic membrane, ear canal and external ear normal. No drainage.     Nose: Nose normal.     Right Sinus:  No maxillary sinus tenderness or frontal sinus tenderness.     Left Sinus: No maxillary sinus tenderness or frontal sinus tenderness.     Mouth/Throat:     Mouth: Mucous membranes are moist.     Pharynx: Oropharynx is clear. Uvula midline. No pharyngeal swelling, oropharyngeal exudate or posterior oropharyngeal erythema.  Eyes:     General: Lids are normal.        Right eye: No discharge.        Left eye: No discharge.     Extraocular Movements: Extraocular movements intact.     Conjunctiva/sclera: Conjunctivae normal.     Pupils: Pupils are equal, round, and reactive to light.     Visual Fields: Right eye visual fields normal and left eye visual fields normal.  Neck:     Thyroid: No thyromegaly.     Vascular: No carotid bruit.     Trachea: Trachea normal.  Cardiovascular:     Rate and Rhythm: Normal rate and regular rhythm.     Heart sounds: Normal heart sounds. No murmur heard.   No gallop.  Pulmonary:     Effort: Pulmonary effort is normal. No accessory muscle usage or respiratory distress.     Breath sounds: Normal breath sounds. No wheezing or rales.  Chest:  Breasts:    Right: Normal. No axillary adenopathy or supraclavicular adenopathy.     Left: Normal. No axillary adenopathy or supraclavicular adenopathy.  Abdominal:     General:  Bowel sounds are normal.     Palpations: Abdomen is soft. There is no hepatomegaly or splenomegaly.     Tenderness: There is no abdominal tenderness.  Musculoskeletal:        General: Normal range of motion.     Cervical back: Normal range of motion and neck supple.     Right lower leg: No edema.     Left lower leg: No edema.  Lymphadenopathy:     Head:     Right side of head: No submental, submandibular, tonsillar, preauricular or posterior auricular adenopathy.     Left side of head: No submental, submandibular, tonsillar, preauricular or posterior auricular adenopathy.     Cervical: No cervical adenopathy.     Upper Body:     Right upper body: No supraclavicular, axillary or pectoral adenopathy.     Left upper body: No supraclavicular, axillary or pectoral adenopathy.  Skin:    General: Skin is warm and dry.     Capillary Refill: Capillary refill takes less than 2 seconds.     Findings: No rash.  Neurological:     General: No focal deficit present.     Mental Status: She is alert and oriented to person, place, and time. Mental status is at baseline.     Cranial Nerves: Cranial nerves are intact.     Gait: Gait is intact.     Deep Tendon Reflexes: Reflexes are normal and symmetric.     Reflex Scores:      Brachioradialis reflexes are 2+ on the right side and 2+ on the left side.      Patellar reflexes are 2+ on the right side and 2+ on the left side. Psychiatric:        Attention and Perception: Attention normal.        Mood and Affect: Mood normal.        Speech: Speech normal.        Behavior: Behavior normal. Behavior is cooperative.        Thought  Content: Thought content normal.        Judgment: Judgment normal.     Assessment & Plan:   Problem List Items Addressed This Visit       Cardiovascular and Mediastinum   Essential hypertension    Chronic.  Controlled.  Continue with current medication regimen.  Labs stable.          Respiratory   Deviated nasal  septum    Controlled.  No concerns at this time.         Endocrine   Multinodular goiter    Chronic.  Controlled.  Continue with current medication regimen.  Labs stable.         Other   Anxiety    Chronic.  Controlled.  Continue with current medication regimen.  Labs stable.       Overweight (BMI 25.0-29.9)    Chronic.  Controlled.  Continue with current medication regimen.  Labs stable.       Elevated LDL cholesterol level    Chronic.  Controlled.  Continue with current medication regimen.  Labs stable.       Other Visit Diagnoses     Pre-op exam    -  Primary   Labs and EKG ordered during visit today.  Will need Cardiac Clearance prior to surgery. EKG showed inverted P waves. Will need Cardiac clearance.   Relevant Orders   EKG 12-Lead (Completed)   Comp Met (CMET)   CBC w/Diff   Urinalysis, Routine w reflex microscopic   Lipid Profile   TSH   Ambulatory referral to Cardiology        Follow up plan: Return if symptoms worsen or fail to improve.

## 2021-01-19 NOTE — Assessment & Plan Note (Signed)
Chronic.  Controlled.  Continue with current medication regimen.  Labs stable.

## 2021-01-20 LAB — COMPREHENSIVE METABOLIC PANEL
ALT: 45 IU/L — ABNORMAL HIGH (ref 0–32)
AST: 54 IU/L — ABNORMAL HIGH (ref 0–40)
Albumin/Globulin Ratio: 1.7 (ref 1.2–2.2)
Albumin: 4.5 g/dL (ref 3.8–4.9)
Alkaline Phosphatase: 69 IU/L (ref 44–121)
BUN/Creatinine Ratio: 21 (ref 9–23)
BUN: 15 mg/dL (ref 6–24)
Bilirubin Total: 0.3 mg/dL (ref 0.0–1.2)
CO2: 23 mmol/L (ref 20–29)
Calcium: 9.1 mg/dL (ref 8.7–10.2)
Chloride: 103 mmol/L (ref 96–106)
Creatinine, Ser: 0.72 mg/dL (ref 0.57–1.00)
Globulin, Total: 2.6 g/dL (ref 1.5–4.5)
Glucose: 88 mg/dL (ref 65–99)
Potassium: 4.5 mmol/L (ref 3.5–5.2)
Sodium: 141 mmol/L (ref 134–144)
Total Protein: 7.1 g/dL (ref 6.0–8.5)
eGFR: 97 mL/min/{1.73_m2} (ref >59)

## 2021-01-20 LAB — LIPID PANEL
Chol/HDL Ratio: 2.6 ratio (ref 0.0–4.4)
Cholesterol, Total: 218 mg/dL — ABNORMAL HIGH (ref 100–199)
HDL: 85 mg/dL (ref >39)
LDL Chol Calc (NIH): 122 mg/dL — ABNORMAL HIGH (ref 0–99)
Triglycerides: 64 mg/dL (ref 0–149)
VLDL Cholesterol Cal: 11 mg/dL (ref 5–40)

## 2021-01-20 LAB — CBC WITH DIFFERENTIAL/PLATELET
Basophils Absolute: 0.1 10*3/uL (ref 0.0–0.2)
Basos: 1 %
EOS (ABSOLUTE): 0.1 10*3/uL (ref 0.0–0.4)
Eos: 1 %
Hematocrit: 39.1 % (ref 34.0–46.6)
Hemoglobin: 12.9 g/dL (ref 11.1–15.9)
Immature Grans (Abs): 0 10*3/uL (ref 0.0–0.1)
Immature Granulocytes: 0 %
Lymphocytes Absolute: 3.3 10*3/uL — ABNORMAL HIGH (ref 0.7–3.1)
Lymphs: 42 %
MCH: 29.9 pg (ref 26.6–33.0)
MCHC: 33 g/dL (ref 31.5–35.7)
MCV: 91 fL (ref 79–97)
Monocytes Absolute: 0.5 10*3/uL (ref 0.1–0.9)
Monocytes: 7 %
Neutrophils Absolute: 3.8 10*3/uL (ref 1.4–7.0)
Neutrophils: 49 %
Platelets: 312 10*3/uL (ref 150–450)
RBC: 4.32 x10E6/uL (ref 3.77–5.28)
RDW: 12.6 % (ref 11.7–15.4)
WBC: 7.7 10*3/uL (ref 3.4–10.8)

## 2021-01-20 LAB — TSH: TSH: 2 u[IU]/mL (ref 0.450–4.500)

## 2021-01-20 MED ORDER — HYDROXYZINE HCL 10 MG PO TABS: 10.0000 mg | ORAL_TABLET | Freq: Three times a day (TID) | ORAL | 0 refills | Status: DC | PRN

## 2021-01-20 NOTE — Progress Notes (Signed)
Hi Carolyn Wood.  Your liver enzymes are slightly elevated.  This is common for you and we will continue to monitor in the future. Your cholesterol is improved from prior. You are cleared for surgery pending your cardiac clearance. Please let me know if you have any questions.

## 2021-01-25 DIAGNOSIS — M4310 Spondylolisthesis, site unspecified: Secondary | ICD-10-CM | POA: Insufficient documentation

## 2021-01-25 DIAGNOSIS — M7138 Other bursal cyst, other site: Secondary | ICD-10-CM | POA: Insufficient documentation

## 2021-01-31 ENCOUNTER — Other Ambulatory Visit: Payer: Self-pay

## 2021-01-31 ENCOUNTER — Encounter: Payer: Self-pay | Admitting: Cardiology

## 2021-01-31 ENCOUNTER — Ambulatory Visit: Payer: BC Managed Care – PPO | Admitting: Cardiology

## 2021-01-31 VITALS — BP 128/92 | HR 67 | Ht 66.0 in | Wt 161.2 lb

## 2021-01-31 DIAGNOSIS — I10 Essential (primary) hypertension: Secondary | ICD-10-CM | POA: Diagnosis not present

## 2021-01-31 DIAGNOSIS — Z01818 Encounter for other preprocedural examination: Secondary | ICD-10-CM

## 2021-01-31 DIAGNOSIS — R9431 Abnormal electrocardiogram [ECG] [EKG]: Secondary | ICD-10-CM

## 2021-01-31 DIAGNOSIS — Z01810 Encounter for preprocedural cardiovascular examination: Secondary | ICD-10-CM

## 2021-01-31 NOTE — Progress Notes (Signed)
Cardiology Office Note:    Date:  01/31/2021   ID:  Carolyn Wood, DOB 09-11-1960, MRN [id number]  PCP:  Larae Grooms, NP   [facility] Providers Cardiologist:  None     Referring MD: Larae Grooms, NP   Chief Complaint  Patient presents with   Other    Pre op Abnormal EKG no complaints today. Meds reviewed verbally with pt.   Carolyn Wood is a 60 y.o. female who is being seen today for the evaluation of abnormal EKG at the request of Larae Grooms, NP.   History of Present Illness:    Carolyn Wood is a 60 y.o. female with a hx of hypertension, former smoker x10 years who presents due to abnormal EKG and preop eval.    Patient with history of chronic back pain, states having a cyst growing around her spine.  Laminectomy is being planned.  EKG obtained on 01/19/2021 showed P wave inversions and nonspecific T wave changes.  Denies history of heart disease, BP controlled on current meds.  Denies chest pain or shortness of breath, patient is very active.  Past Medical History:  Diagnosis Date   Anxiety    Arthritis    Complication of anesthesia    combative when waking up   Elevated liver enzymes    History of Papanicolaou smear of cervix 11/05/2013   RNIL/NEG   HTN (hypertension)    Seasonal allergies    Synovial cyst of lumbar spine     Past Surgical History:  Procedure Laterality Date   ABDOMINAL HYSTERECTOMY     AUGMENTATION MAMMAPLASTY Bilateral 2015   silicone   BUNIONECTOMY Bilateral    CESAREAN SECTION     chin implant     cyst removed     tail bone   JOINT REPLACEMENT Right    hip   KNEE ARTHROSCOPY Right 1989   "dont know what they did"   LIPOSUCTION     on neck and chin   NASAL SEPTUM SURGERY Right 07/2016   NECK SURGERY  05/26/2019   TONSILLECTOMY     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   ASHWAGANDHA PO Take by mouth at bedtime.   celecoxib (CELEBREX) 200 MG capsule Take 100 mg by mouth daily.    hydrOXYzine (ATARAX/VISTARIL) 10 MG tablet Take 1 tablet (10 mg total) by mouth every 8 (eight) hours as needed.   MAGNESIUM PO Take by mouth daily.   melatonin 5 MG TABS Take 5 mg by mouth at bedtime.   traMADol (ULTRAM) 50 MG tablet Take by mouth every 6 (six) hours as needed.     Allergies:   Patient has no known allergies.   Social History   Socioeconomic History   Marital status: Single    Spouse name: Not on file   Number of children: Not on file   Years of education: Not on file   Highest education level: Not on file  Occupational History   Not on file  Tobacco Use   Smoking status: Former  Packs/day: 0.50    Years: 20.00    Pack years: 10.00    Types: Cigarettes, Cigars   Smokeless tobacco: Never  Vaping Use   Vaping Use: Never used  Substance and Sexual Activity   Alcohol use: Not Currently   Drug use: No   Sexual activity: Yes  Other Topics Concern   Not on file  Social History Narrative   Not on file   Social Determinants of Health   Financial Resource Strain: Not on file  Food Insecurity: Not on file  Transportation Needs: Not on file  Physical Activity: Not on file  Stress: Not on file  Social Connections: Not on file     Family History: The patient's family history includes Cancer in her mother; Diabetes in her mother; Heart failure in her mother; Hypertension in her mother. There is no history of Breast cancer or Ovarian cancer.  ROS:   Please see the history of present  illness.     All other systems reviewed and are negative.  EKGs/Labs/Other Studies Reviewed:    The following studies were reviewed today:   EKG:  EKG is  ordered today.  The ekg ordered today demonstrates normal sinus rhythm, normal ECG.  Recent Labs: 01/19/2021: ALT 45; BUN 15; Creatinine, Ser 0.72; Hemoglobin 12.9; Platelets 312; Potassium 4.5; Sodium 141; TSH 2.000  Recent Lipid Panel    Component Value Date/Time   CHOL 218 (H) 01/19/2021 1433   TRIG 64 01/19/2021 1433   HDL 85 01/19/2021 1433   CHOLHDL 2.6 01/19/2021 1433   LDLCALC 122 (H) 01/19/2021 1433     Risk Assessment/Calculations:          Physical Exam:    VS:  BP (!) 128/92 (BP Location: Right Arm, Patient Position: Sitting, Cuff Size: Normal)   Pulse 67   Ht 5\' 6"  (1.676 m)   Wt 161 lb 4 oz (73.1 kg)   SpO2 98%   BMI 26.03 kg/m     Wt Readings from Last 3 Encounters:  01/31/21 161 lb 4 oz (73.1 kg)  01/19/21 160 lb (72.6 kg)  11/10/20 162 lb 6 oz (73.7 kg)     GEN:  Well nourished, well developed in no acute distress HEENT: Normal NECK: No JVD; No carotid bruits LYMPHATICS: No lymphadenopathy CARDIAC: RRR, no murmurs, rubs, gallops RESPIRATORY:  Clear to auscultation without rales, wheezing or rhonchi  ABDOMEN: Soft, non-tender, non-distended MUSCULOSKELETAL:  No edema; No deformity  SKIN: Warm and dry NEUROLOGIC:  Alert and oriented x 3 PSYCHIATRIC:  Normal affect   ASSESSMENT:    1. Nonspecific abnormal electrocardiogram (ECG) (EKG)   2. Primary hypertension   3. Pre-op evaluation    PLAN:    In order of problems listed above:  P wave inversion on EKG obtained from PCPs office.  Nonspecific T wave changes.  EKG today is normal.  Patient has no cardiac symptoms including chest pain or shortness of breath.  Continue to monitor, no clinical significance, no additional testing indicated. Hypertension, BP controlled.  Continue amlodipine, olmesartan. Preop eval, laminectomy.  No chest  pain, no shortness of breath.  EKG today normal.  Okay to proceed with procedure from a cardiac perspective.  No indication for additional testing at this time according to [facility] AHA guidelines.  Follow-up as needed     Medication Adjustments/Labs and Tests Ordered: Current medicines are reviewed at length with the patient today.  Concerns regarding medicines are outlined above.  Orders Placed This Encounter  Procedures  EKG 12-Lead    No orders of the defined types were placed in this encounter.   Patient Instructions  Medication Instructions:  Your physician recommends that you continue on your current medications as directed. Please refer to the Current Medication list given to you today.  *If you need a refill on your cardiac medications before your next appointment, please call your pharmacy*   Lab Work: None ordered If you have labs (blood work) drawn today and your tests are completely normal, you will receive your results only by: MyChart Message (if you have MyChart) OR A paper copy in the mail If you have any lab test that is abnormal or we need to change your treatment, we will call you to review the results.   Testing/Procedures: None ordered   Follow-Up: At [facility], you and your health needs are our priority.  As part of our continuing mission to provide you with exceptional heart care, we have created designated Provider Care Teams.  These Care Teams include your primary Cardiologist (physician) and Advanced Practice Providers (APPs -  Physician Assistants and Nurse Practitioners) who all work together to provide you with the care you need, when you need it.  We recommend signing up for the patient portal called "MyChart".  Sign up information is provided on this After Visit Summary.  MyChart is used to connect with patients for Virtual Visits (Telemedicine).  Patients are able to view lab/test results, encounter notes, upcoming appointments, etc.  Non-urgent  messages can be sent to your provider as well.   To learn more about what you can do with MyChart, go to [email or web address].    Your next appointment:   Follow up as needed   The format for your next appointment:   In Person  Provider:   You may see  or one of the following Advanced Practice Providers on your designated Care Team:   Nicolasa Ducking, NP Eula Listen, PA-C Marisue Ivan, PA-C Cadence [place], [place]   Other Instructions    Signed, Debbe Odea, MD  01/31/2021 4:41 PM     [facility]

## 2021-01-31 NOTE — Patient Instructions (Signed)
Medication Instructions:  Your physician recommends that you continue on your current medications as directed. Please refer to the Current Medication list given to you today.  *If you need a refill on your cardiac medications before your next appointment, please call your pharmacy*   Lab Work: None ordered If you have labs (blood work) drawn today and your tests are completely normal, you will receive your results only by: MyChart Message (if you have MyChart) OR A paper copy in the mail If you have any lab test that is abnormal or we need to change your treatment, we will call you to review the results.   Testing/Procedures: None ordered   Follow-Up: At [facility], you and your health needs are our priority.  As part of our continuing mission to provide you with exceptional heart care, we have created designated Provider Care Teams.  These Care Teams include your primary Cardiologist (physician) and Advanced Practice Providers (APPs -  Physician Assistants and Nurse Practitioners) who all work together to provide you with the care you need, when you need it.  We recommend signing up for the patient portal called "MyChart".  Sign up information is provided on this After Visit Summary.  MyChart is used to connect with patients for Virtual Visits (Telemedicine).  Patients are able to view lab/test results, encounter notes, upcoming appointments, etc.  Non-urgent messages can be sent to your provider as well.   To learn more about what you can do with MyChart, go to [email or web address].    Your next appointment:   Follow up as needed   The format for your next appointment:   In Person  Provider:   You may see  or one of the following Advanced Practice Providers on your designated Care Team:   Nicolasa Ducking, NP Eula Listen, PA-C Marisue Ivan, PA-C Cadence [place], [place]   Other Instructions

## 2021-03-30 ENCOUNTER — Other Ambulatory Visit: Payer: Self-pay

## 2021-03-30 ENCOUNTER — Encounter: Payer: Self-pay | Admitting: Advanced Practice Midwife

## 2021-03-30 ENCOUNTER — Ambulatory Visit (INDEPENDENT_AMBULATORY_CARE_PROVIDER_SITE_OTHER): Payer: BC Managed Care – PPO | Admitting: Advanced Practice Midwife

## 2021-03-30 VITALS — BP 125/83 | HR 89 | Ht 67.0 in | Wt 163.0 lb

## 2021-03-30 DIAGNOSIS — Z01419 Encounter for gynecological examination (general) (routine) without abnormal findings: Secondary | ICD-10-CM | POA: Diagnosis not present

## 2021-03-30 DIAGNOSIS — Z1239 Encounter for other screening for malignant neoplasm of breast: Secondary | ICD-10-CM | POA: Diagnosis not present

## 2021-03-30 NOTE — Progress Notes (Signed)
Gynecology Annual Exam  PCP: Larae Grooms, NP  Chief Complaint:  Chief Complaint  Patient presents with   Gynecologic Exam    Annual - needs MMG. Procedure RM    History of Present Illness:Patient is a 60 y.o. G2P2002 presents for annual exam. The patient has no gyn complaints today. She mentions a precancerous growth on her perineum that was removed years ago. She is unable to see the area and would like to have it examined. She has not noticed any symptoms such as itching, irritation or changes in the skin.   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 She is awaiting a neurology consult for possible surgery.  LMP: No LMP recorded. Patient has had a hysterectomy.   The patient is not currently sexually active. She denies dyspareunia.  The patient does perform self breast exams.  There is no notable family history of breast or ovarian cancer in her family.  The patient wears seatbelts: yes.   The patient has regular exercise:  she is active in her garden and caring for her animals, she admits healthy diet, adequate hydration and adequate sleep .    The patient denies current symptoms of depression.     Review of Systems: Review of Systems  Constitutional:  Negative for chills and fever.  HENT:  Negative for congestion, ear discharge, ear pain, hearing loss, sinus pain and sore throat.   Eyes:  Negative for blurred vision and double vision.  Respiratory:  Negative for cough, shortness of breath and wheezing.   Cardiovascular:  Negative for chest pain, palpitations and leg swelling.  Gastrointestinal:  Negative for abdominal pain, blood in stool, constipation, diarrhea, heartburn, melena, nausea and vomiting.  Genitourinary:  Negative for dysuria, flank pain, frequency, hematuria and urgency.  Musculoskeletal:  Positive for back pain and joint pain. Negative for myalgias.  Skin:  Negative for itching and rash.   Neurological:  Negative for dizziness, tingling, tremors, sensory change, speech change, focal weakness, seizures, loss of consciousness, weakness and headaches.  Endo/Heme/Allergies:  Negative for environmental allergies. Does not bruise/bleed easily.  Psychiatric/Behavioral:  Negative for depression, hallucinations, memory loss, substance abuse and suicidal ideas. The patient is not nervous/anxious and does not have insomnia.    Past Medical History:  Patient Active Problem List   Diagnosis Date Noted   Synovial cyst of lumbar spine 01/25/2021   Spondylolisthesis 01/25/2021   Elevated LDL cholesterol level 08/13/2019   DDD (degenerative disc disease), cervical 06/05/2019   Varicose veins of leg with pain, bilateral 02/14/2019   Multinodular goiter 06/11/2018   Overweight (BMI 25.0-29.9) 06/05/2018   S/P left THA, AA 06/04/2018   Osteoarthritis of hip 10/30/2017   Localized, primary osteoarthritis of shoulder region 09/19/2017   Deviated nasal septum 03/20/2016    Overview:  Added automatically from request for surgery [id number]    Nasal obstruction 03/20/2016    Overview:  Added automatically from request for surgery [id number]    Essential hypertension 11/17/2015   Tinnitus of both ears 10/05/2015   Insomnia 10/05/2015   Anxiety 03/22/2015    Reviewed anxiety care and treatment with patient. Encouraged to consider psychiatric care for more thorough evaluation and treatment than I am able to provide    S/P hip replacement 10/29/2012    Past Surgical History:  Past Surgical History:  Procedure Laterality Date   ABDOMINAL HYSTERECTOMY     AUGMENTATION MAMMAPLASTY Bilateral  2015   silicone   BUNIONECTOMY Bilateral    CESAREAN SECTION     chin implant     cyst removed     tail bone   JOINT REPLACEMENT Right    hip   KNEE ARTHROSCOPY Right 1989   "dont know what they did"   LIPOSUCTION     on neck and chin   NASAL SEPTUM SURGERY Right 07/2016   NECK SURGERY  05/26/2019    TONSILLECTOMY     TOTAL HIP ARTHROPLASTY Right 10/29/2012   Procedure: RIGHT TOTAL HIP ARTHROPLASTY ANTERIOR APPROACH;  Surgeon: Shelda Pal, MD;  Location: [facility];  Service: Orthopedics;  Laterality: Right;   TOTAL HIP ARTHROPLASTY Left 06/04/2018   Procedure: LEFT TOTAL HIP ARTHROPLASTY ANTERIOR APPROACH;  Surgeon: Durene Romans, MD;  Location: [facility];  Service: Orthopedics;  Laterality: Left;  70 mins    Gynecologic History:  No LMP recorded. Patient has had a hysterectomy. Last Pap: 2021 Results were:  no abnormalities  Last mammogram: 2021 Results were: BI-RAD I  Obstetric History: G2P2002  Family History:  Family History  Problem Relation Age of Onset   Heart failure Mother    Cancer Mother        kidney   Diabetes Mother    Hypertension Mother    Lung cancer Maternal Grandmother    Breast cancer Neg Hx    Ovarian cancer Neg Hx     Social History:  Social History   Socioeconomic History   Marital status: Single    Spouse name: Not on file   Number of children: Not on file   Years of education: Not on file   Highest education level: Not on file  Occupational History   Not on file  Tobacco Use   Smoking status: Former    Packs/day: 0.50    Years: 20.00    Pack years: 10.00    Types: Cigarettes, Cigars   Smokeless tobacco: Never  Vaping Use   Vaping Use: Never used  Substance and Sexual Activity   Alcohol use: Not Currently   Drug use: No   Sexual activity: Yes    Birth control/protection: Surgical    Comment: Hysterectomy  Other Topics Concern   Not on file  Social History Narrative   Not on file   Social Determinants of Health   Financial Resource Strain: Not on file  Food Insecurity: Not on file  Transportation Needs: Not on file  Physical Activity: Not on file  Stress: Not on file  Social Connections: Not on file  Intimate Partner Violence: Not on file    Allergies:  No Known Allergies  Medications: Prior to Admission medications    Medication Sig Start Date End Date Taking? Authorizing Provider  amLODipine-olmesartan (AZOR) 5-40 MG tablet Take 1 tablet by mouth daily. 11/10/20  Yes Larae Grooms, NP  ASHWAGANDHA PO Take by mouth at bedtime.   Yes [provider]  celecoxib (CELEBREX) 200 MG capsule Take 100 mg by mouth daily.    Yes [provider]  hydrOXYzine (ATARAX/VISTARIL) 10 MG tablet Take 1 tablet (10 mg total) by mouth every 8 (eight) hours as needed. 01/20/21  Yes Larae Grooms, NP  MAGNESIUM PO Take by mouth daily.   Yes [provider]  melatonin 5 MG TABS Take 5 mg by mouth at bedtime.   Yes [provider]  traMADol (ULTRAM) 50 MG tablet Take by mouth every 6 (six) hours as needed.   Yes [provider]  Physical Exam Vitals: Blood pressure 125/83, pulse 89, height 5\' 7"  (1.702 m), weight 163 lb (73.9 kg).  General: NAD HEENT: normocephalic, anicteric Thyroid: no enlargement, no palpable nodules Pulmonary: No increased work of breathing, CTAB Cardiovascular: RRR, distal pulses 2+ Breast: Breast symmetrical, no tenderness, no palpable nodules or masses, no skin or nipple retraction present, no nipple discharge.  No axillary or supraclavicular lymphadenopathy. Implants present. Abdomen: NABS, soft, non-tender, non-distended.  Umbilicus without lesions.  No hepatomegaly, splenomegaly or masses palpable. No evidence of hernia  Genitourinary:  External: Normal external female genitalia.  Normal urethral meatus, normal Bartholin's and Skene's glands. No evidence of perineal lesion  Vagina: Normal vaginal mucosa, no evidence of prolapse.   Extremities: no edema, erythema, or tenderness Neurologic: Grossly intact Psychiatric: mood appropriate, affect full    Assessment: 60 y.o. G2P2002 routine annual exam  Plan: Problem List Items Addressed This Visit   None Visit Diagnoses     Well woman exam with routine gynecological exam    -  Primary    Relevant Orders   MM 3D SCREEN BREAST BILATERAL   Breast screening       Relevant Orders   MM 3D SCREEN BREAST BILATERAL       1) Mammogram - recommend yearly screening mammogram.  Mammogram Was ordered today  2) STI screening  was offered and declined  3) ASCCP guidelines and rationale discussed.  Patient opts for every 5 years screening interval  4) Osteoporosis  - per USPTF routine screening DEXA at age 68  Consider FDA-approved medical therapies in postmenopausal women and men aged 49 years and older, based on the following: a) A hip or vertebral (clinical or morphometric) fracture b) T-score ? -2.5 at the femoral neck or spine after appropriate evaluation to exclude secondary causes C) Low bone mass (T-score between -1.0 and -2.5 at the femoral neck or spine) and a 10-year probability of a hip fracture ? 3% or a 10-year probability of a major osteoporosis-related fracture ? 20% based on the [place]-adapted WHO algorithm   5) Routine healthcare maintenance including cholesterol, diabetes screening discussed managed by PCP  6) Colonoscopy is up to date per patient report.  Screening recommended starting at age 75 for average risk individuals, age 76 for individuals deemed at increased risk (including African Americans) and recommended to continue until age 38.  For patient age 78-85 individualized approach is recommended.  Gold standard screening is via colonoscopy, Cologuard screening is an acceptable alternative for patient unwilling or unable to undergo colonoscopy.  "Colorectal cancer screening for average?risk adults: 2018 guideline update from the American Cancer Society"CA: A Cancer Journal for Clinicians: Nov 22, 2016   7) Return in about 1 year (around 03/30/2022) for annual established gyn.   05/30/2022, CNM [facility] 03/30/21, 4:11 PM

## 2021-03-30 NOTE — Patient Instructions (Signed)
Health Maintenance, Female Adopting a healthy lifestyle and getting preventive care are important in promoting health and wellness. Ask your health care provider about: The right schedule for you to have regular tests and exams. Things you can do on your own to prevent diseases and keep yourself healthy. What should I know about diet, weight, and exercise? Eat a healthy diet  Eat a diet that includes plenty of vegetables, fruits, low-fat dairy products, and lean protein. Do not eat a lot of foods that are high in solid fats, added sugars, or sodium. Maintain a healthy weight Body mass index (BMI) is used to identify weight problems. It estimates body fat based on height and weight. Your health care provider can help determine your BMI and help you achieve or maintain a healthy weight. Get regular exercise Get regular exercise. This is one of the most important things you can do for your health. Most adults should: Exercise for at least 150 minutes each week. The exercise should increase your heart rate and make you sweat (moderate-intensity exercise). Do strengthening exercises at least twice a week. This is in addition to the moderate-intensity exercise. Spend less time sitting. Even light physical activity can be beneficial. Watch cholesterol and blood lipids Have your blood tested for lipids and cholesterol at 58 years of age, then have this test every 5 years. Have your cholesterol levels checked more often if: Your lipid or cholesterol levels are high. You are older than 58 years of age. You are at high risk for heart disease. What should I know about cancer screening? Depending on your health history and family history, you may need to have cancer screening at various ages. This may include screening for: Breast cancer. Cervical cancer. Colorectal cancer. Skin cancer. Lung cancer. What should I know about heart disease, diabetes, and high blood pressure? Blood pressure and heart  disease High blood pressure causes heart disease and increases the risk of stroke. This is more likely to develop in people who have high blood pressure readings, are of African descent, or are overweight. Have your blood pressure checked: Every 3-5 years if you are 18-39 years of age. Every year if you are 40 years old or older. Diabetes Have regular diabetes screenings. This checks your fasting blood sugar level. Have the screening done: Once every three years after age 40 if you are at a normal weight and have a low risk for diabetes. More often and at a younger age if you are overweight or have a high risk for diabetes. What should I know about preventing infection? Hepatitis B If you have a higher risk for hepatitis B, you should be screened for this virus. Talk with your health care provider to find out if you are at risk for hepatitis B infection. Hepatitis C Testing is recommended for: Everyone born from 1945 through 1965. Anyone with known risk factors for hepatitis C. Sexually transmitted infections (STIs) Get screened for STIs, including gonorrhea and chlamydia, if: You are sexually active and are younger than 58 years of age. You are older than 58 years of age and your health care provider tells you that you are at risk for this type of infection. Your sexual activity has changed since you were last screened, and you are at increased risk for chlamydia or gonorrhea. Ask your health care provider if you are at risk. Ask your health care provider about whether you are at high risk for HIV. Your health care provider may recommend a prescription medicine   to help prevent HIV infection. If you choose to take medicine to prevent HIV, you should first get tested for HIV. You should then be tested every 3 months for as long as you are taking the medicine. Pregnancy If you are about to stop having your period (premenopausal) and you may become pregnant, seek counseling before you get  pregnant. Take 400 to 800 micrograms (mcg) of folic acid every day if you become pregnant. Ask for birth control (contraception) if you want to prevent pregnancy. Osteoporosis and menopause Osteoporosis is a disease in which the bones lose minerals and strength with aging. This can result in bone fractures. If you are 65 years old or older, or if you are at risk for osteoporosis and fractures, ask your health care provider if you should: Be screened for bone loss. Take a calcium or vitamin D supplement to lower your risk of fractures. Be given hormone replacement therapy (HRT) to treat symptoms of menopause. Follow these instructions at home: Lifestyle Do not use any products that contain nicotine or tobacco, such as cigarettes, e-cigarettes, and chewing tobacco. If you need help quitting, ask your health care provider. Do not use street drugs. Do not share needles. Ask your health care provider for help if you need support or information about quitting drugs. Alcohol use Do not drink alcohol if: Your health care provider tells you not to drink. You are pregnant, may be pregnant, or are planning to become pregnant. If you drink alcohol: Limit how much you use to 0-1 drink a day. Limit intake if you are breastfeeding. Be aware of how much alcohol is in your drink. In the [place] one drink equals one 12 oz bottle of beer (355 mL), one 5 oz glass of wine (148 mL), or one 1 oz glass of hard liquor (44 mL). General instructions Schedule regular health, dental, and eye exams. Stay current with your vaccines. Tell your health care provider if: You often feel depressed. You have ever been abused or do not feel safe at home. Summary Adopting a healthy lifestyle and getting preventive care are important in promoting health and wellness. Follow your health care provider's instructions about healthy diet, exercising, and getting tested or screened for diseases. Follow your health care provider's  instructions on monitoring your cholesterol and blood pressure. This information is not intended to replace advice given to you by your health care provider. Make sure you discuss any questions you have with your health care provider. Document Revised: 08/20/2020 Document Reviewed: 06/05/2018 Elsevier Patient Education  2022 Elsevier Inc.  

## 2021-04-06 ENCOUNTER — Other Ambulatory Visit: Payer: Self-pay

## 2021-04-07 MED ORDER — HYDROXYZINE HCL 10 MG PO TABS: 10.0000 mg | ORAL_TABLET | Freq: Three times a day (TID) | ORAL | 0 refills | Status: AC | PRN

## 2021-04-07 NOTE — Telephone Encounter (Signed)
Patient is requesting a refill for Hydroxyzine 10 mg Q8 PRN  Last ov 01/19/21  Next OV 11/11/21

## 2021-04-22 ENCOUNTER — Other Ambulatory Visit: Payer: Self-pay | Admitting: Obstetrics & Gynecology

## 2021-04-26 ENCOUNTER — Other Ambulatory Visit: Payer: Self-pay | Admitting: Obstetrics & Gynecology

## 2021-04-26 NOTE — Telephone Encounter (Signed)
Sch Annual

## 2021-04-28 ENCOUNTER — Other Ambulatory Visit: Payer: Self-pay | Admitting: Obstetrics & Gynecology

## 2021-04-28 MED ORDER — VALACYCLOVIR HCL 500 MG PO TABS: 500.0000 mg | ORAL_TABLET | Freq: Every day | ORAL | 3 refills | Status: DC

## 2021-05-30 ENCOUNTER — Other Ambulatory Visit: Payer: Self-pay

## 2021-05-30 MED ORDER — VALACYCLOVIR HCL 500 MG PO TABS: 500.0000 mg | ORAL_TABLET | Freq: Every day | ORAL | 3 refills | Status: DC

## 2021-06-02 ENCOUNTER — Ambulatory Visit
Admission: RE | Admit: 2021-06-02 | Discharge: 2021-06-02 | Disposition: A | Discharge status: Home / Self Care | Payer: BC Managed Care – PPO | Source: Ambulatory Visit | Attending: Advanced Practice Midwife | Admitting: Advanced Practice Midwife

## 2021-06-02 ENCOUNTER — Other Ambulatory Visit: Payer: Self-pay

## 2021-06-02 DIAGNOSIS — Z01419 Encounter for gynecological examination (general) (routine) without abnormal findings: Secondary | ICD-10-CM

## 2021-06-02 DIAGNOSIS — Z1231 Encounter for screening mammogram for malignant neoplasm of breast: Secondary | ICD-10-CM | POA: Insufficient documentation

## 2021-06-02 DIAGNOSIS — Z1239 Encounter for other screening for malignant neoplasm of breast: Secondary | ICD-10-CM

## 2021-07-21 ENCOUNTER — Other Ambulatory Visit: Payer: Self-pay

## 2021-07-21 ENCOUNTER — Encounter: Payer: Self-pay | Admitting: Nurse Practitioner

## 2021-07-21 ENCOUNTER — Ambulatory Visit: Payer: BC Managed Care – PPO | Admitting: Nurse Practitioner

## 2021-07-21 DIAGNOSIS — I10 Essential (primary) hypertension: Secondary | ICD-10-CM

## 2021-07-21 MED ORDER — VALSARTAN 80 MG PO TABS: 80.0000 mg | ORAL_TABLET | Freq: Every day | ORAL | 1 refills | Status: DC

## 2021-07-21 NOTE — Progress Notes (Signed)
BP (!) 151/91    Pulse 70    Temp 98.3 F (36.8 C) (Oral)    Ht 5' 7.01" (1.702 m)    Wt 174 lb 9.6 oz (79.2 kg)    SpO2 95%    BMI 27.34 kg/m    Subjective:    Patient ID: Carolyn Wood, female    DOB: 1961-05-04, 61 y.o.   MRN: [id number]  HPI: Carolyn Wood is a 61 y.o. female  Chief Complaint  Patient presents with   Hypertension   HYPERTENSION Patient states she has been out of her blood pressure medication for 3 months. Hypertension status: uncontrolled  Satisfied with current treatment? yes Duration of hypertension: years BP monitoring frequency:  not checking BP range:  BP medication side effects:  no Medication compliance: poor compliance Previous BP meds:amlodipine and olmesartan (benicar) Aspirin: no Recurrent headaches: no Visual changes: no Palpitations: no Dyspnea: no Chest pain: no Lower extremity edema: no Dizzy/lightheaded: no She states when she stopped the medication she stopped having swelling in her lower extremities.   Relevant past medical, surgical, family and social history reviewed and updated as indicated. Interim medical history since our last visit reviewed. Allergies and medications reviewed and updated.  Review of Systems  Eyes:  Negative for visual disturbance.  Respiratory:  Negative for cough, chest tightness and shortness of breath.   Cardiovascular:  Negative for chest pain, palpitations and leg swelling.  Neurological:  Negative for dizziness and headaches.   Per HPI unless specifically indicated above     Objective:    BP (!) 151/91    Pulse 70    Temp 98.3 F (36.8 C) (Oral)    Ht 5' 7.01" (1.702 m)    Wt 174 lb 9.6 oz (79.2 kg)    SpO2 95%    BMI 27.34 kg/m   Wt Readings from Last 3 Encounters:  07/21/21 174 lb 9.6 oz (79.2 kg)  03/30/21 163 lb (73.9 kg)  01/31/21 161 lb 4 oz (73.1 kg)    Physical Exam Vitals and nursing note reviewed.  Constitutional:      General: She is not in acute distress.     Appearance: Normal appearance. She is normal weight. She is not ill-appearing, toxic-appearing or diaphoretic.  HENT:     Head: Normocephalic.     Right Ear: External ear normal.     Left Ear: External ear normal.     Nose: Nose normal.     Mouth/Throat:     Mouth: Mucous membranes are moist.     Pharynx: Oropharynx is clear.  Eyes:     General:        Right eye: No discharge.        Left eye: No discharge.     Extraocular Movements: Extraocular movements intact.     Conjunctiva/sclera: Conjunctivae normal.     Pupils: Pupils are equal, round, and reactive to light.  Cardiovascular:     Rate and Rhythm: Normal rate and regular rhythm.     Heart sounds: No murmur heard. Pulmonary:     Effort: Pulmonary effort is normal. No respiratory distress.     Breath sounds: Normal breath sounds. No wheezing or rales.  Musculoskeletal:     Cervical back: Normal range of motion and neck supple.  Skin:    General: Skin is warm and dry.     Capillary Refill: Capillary refill takes less than 2 seconds.  Neurological:     General: No focal deficit present.  Mental Status: She is alert and oriented to person, place, and time. Mental status is at baseline.  Psychiatric:        Mood and Affect: Mood normal.        Behavior: Behavior normal.        Thought Content: Thought content normal.        Judgment: Judgment normal.    Results for orders placed or performed in visit on 01/19/21  Microscopic Examination   Urine  Result Value Ref Range   WBC, UA None seen 0 - 5 /hpf   RBC 0-2 0 - 2 /hpf   Epithelial Cells (non renal) None seen 0 - 10 /hpf   Bacteria, UA Few (A) None seen/Few  Comp Met (CMET)  Result Value Ref Range   Glucose 88 65 - 99 mg/dL   BUN 15 6 - 24 mg/dL   Creatinine, Ser 0.72 0.57 - 1.00 mg/dL   eGFR 97 >59 mL/min/1.73   BUN/Creatinine Ratio 21 9 - 23   Sodium 141 134 - 144 mmol/L   Potassium 4.5 3.5 - 5.2 mmol/L   Chloride 103 96 - 106 mmol/L   CO2 23 20 - 29 mmol/L    Calcium 9.1 8.7 - 10.2 mg/dL   Total Protein 7.1 6.0 - 8.5 g/dL   Albumin 4.5 3.8 - 4.9 g/dL   Globulin, Total 2.6 1.5 - 4.5 g/dL   Albumin/Globulin Ratio 1.7 1.2 - 2.2   Bilirubin Total 0.3 0.0 - 1.2 mg/dL   Alkaline Phosphatase 69 44 - 121 IU/L   AST 54 (H) 0 - 40 IU/L   ALT 45 (H) 0 - 32 IU/L  CBC w/Diff  Result Value Ref Range   WBC 7.7 3.4 - 10.8 x10E3/uL   RBC 4.32 3.77 - 5.28 x10E6/uL   Hemoglobin 12.9 11.1 - 15.9 g/dL   Hematocrit 39.1 34.0 - 46.6 %   MCV 91 79 - 97 fL   MCH 29.9 26.6 - 33.0 pg   MCHC 33.0 31.5 - 35.7 g/dL   RDW 12.6 11.7 - 15.4 %   Platelets 312 150 - 450 x10E3/uL   Neutrophils 49 Not Estab. %   Lymphs 42 Not Estab. %   Monocytes 7 Not Estab. %   Eos 1 Not Estab. %   Basos 1 Not Estab. %   Neutrophils Absolute 3.8 1.4 - 7.0 x10E3/uL   Lymphocytes Absolute 3.3 (H) 0.7 - 3.1 x10E3/uL   Monocytes Absolute 0.5 0.1 - 0.9 x10E3/uL   EOS (ABSOLUTE) 0.1 0.0 - 0.4 x10E3/uL   Basophils Absolute 0.1 0.0 - 0.2 x10E3/uL   Immature Granulocytes 0 Not Estab. %   Immature Grans (Abs) 0.0 0.0 - 0.1 x10E3/uL  Urinalysis, Routine w reflex microscopic  Result Value Ref Range   Specific Gravity, UA 1.025 1.005 - 1.030   pH, UA 5.5 5.0 - 7.5   Color, UA Yellow Yellow   Appearance Ur Clear Clear   Leukocytes,UA Negative Negative   Protein,UA Negative Negative/Trace   Glucose, UA Negative Negative   Ketones, UA Negative Negative   RBC, UA 1+ (A) Negative   Bilirubin, UA Negative Negative   Urobilinogen, Ur 0.2 0.2 - 1.0 mg/dL   Nitrite, UA Negative Negative   Microscopic Examination See below:   Lipid Profile  Result Value Ref Range   Cholesterol, Total 218 (H) 100 - 199 mg/dL   Triglycerides 64 0 - 149 mg/dL   HDL 85 >39 mg/dL   VLDL Cholesterol Cal 11 5 -  40 mg/dL   LDL Chol Calc (NIH) 122 (H) 0 - 99 mg/dL   Chol/HDL Ratio 2.6 0.0 - 4.4 ratio  TSH  Result Value Ref Range   TSH 2.000 0.450 - 4.500 uIU/mL      Assessment & Plan:   Problem List Items  Addressed This Visit       Cardiovascular and Mediastinum   Essential hypertension    Chronic.  Not well controlled.  Patient had swelling with Amlodipine.  Will start Valsartan $RemoveBeforeD'80mg'[id number]$  daily.  Side effects and benefits of medication discussed with patient during visit. Recommend keeping a log at home.  Follow up in 1 month for reevaluation.      Relevant Medications   valsartan (DIOVAN) 80 MG tablet     Follow up plan: Return in about 1 month (around 08/21/2021) for BP Check.

## 2021-07-21 NOTE — Assessment & Plan Note (Signed)
Chronic.  Not well controlled.  Patient had swelling with Amlodipine.  Will start Valsartan 80mg  daily.  Side effects and benefits of medication discussed with patient during visit. Recommend keeping a log at home.  Follow up in 1 month for reevaluation.

## 2021-08-12 ENCOUNTER — Ambulatory Visit: Payer: BC Managed Care – PPO | Admitting: Family Medicine

## 2021-08-12 ENCOUNTER — Encounter: Payer: Self-pay | Admitting: Family Medicine

## 2021-08-12 ENCOUNTER — Other Ambulatory Visit: Payer: Self-pay

## 2021-08-12 VITALS — BP 152/92 | HR 91 | Temp 98.1°F | Wt 173.4 lb

## 2021-08-12 DIAGNOSIS — M7138 Other bursal cyst, other site: Secondary | ICD-10-CM | POA: Diagnosis not present

## 2021-08-12 DIAGNOSIS — I10 Essential (primary) hypertension: Secondary | ICD-10-CM | POA: Diagnosis not present

## 2021-08-12 DIAGNOSIS — R002 Palpitations: Secondary | ICD-10-CM

## 2021-08-12 MED ORDER — OLMESARTAN MEDOXOMIL 40 MG PO TABS
40.0000 mg | ORAL_TABLET | Freq: Every day | ORAL | 1 refills | Status: DC
Start: 2021-08-12 — End: 2021-10-26

## 2021-08-12 NOTE — Progress Notes (Signed)
Interpreted by me today NSR 72bpm, no ST segment changes

## 2021-08-12 NOTE — Progress Notes (Signed)
BP (!) 152/92    Pulse 91    Temp 98.1 F (36.7 C)    Wt 173 lb 6.4 oz (78.7 kg)    SpO2 97%    BMI 27.15 kg/m    Subjective:    Patient ID: Carolyn Wood, female    DOB: Nov 30, 1960, 61 y.o.   MRN: [id number]  HPI: Carolyn Wood is a 61 y.o. female  Chief Complaint  Patient presents with   Hypertension   HYPERTENSION Hypertension status: uncontrolled  Satisfied with current treatment? no Duration of hypertension: chronic BP monitoring frequency:  not checking BP range:  BP medication side effects:  no Medication compliance: good compliance Previous BP meds:valsartan Aspirin: no Recurrent headaches: no Visual changes: no Palpitations: no Dyspnea: no Chest pain: no Lower extremity edema: no Dizzy/lightheaded: no  Has a chronic history of back pain. Having issues with insurance. Following with neurosurgery and awaiting surgery.  PALPITATIONS Duration: 5 days off and on Symptom description: fast Duration of episode: couple of minutes Frequency: a few times a day Activity when event occurred: sitting at desk Related to exertion: no Dyspnea: no Chest pain: no Syncope: no Anxiety/stress: yes Nausea/vomiting: yes Diaphoresis: no Coronary artery disease: no Congestive heart failure: no Arrhythmia:no Thyroid disease: no Caffeine intake:  2 cups today Status:  better Treatments attempted:none   Relevant past medical, surgical, family and social history reviewed and updated as indicated. Interim medical history since our last visit reviewed. Allergies and medications reviewed and updated.  Review of Systems  Constitutional: Negative.   Respiratory: Negative.    Cardiovascular:  Positive for palpitations and leg swelling. Negative for chest pain.  Gastrointestinal: Negative.   Musculoskeletal:  Positive for back pain. Negative for arthralgias, gait problem, joint swelling, myalgias, neck pain and neck stiffness.  Skin: Negative.   Neurological:  Negative.   Psychiatric/Behavioral: Negative.     Per HPI unless specifically indicated above     Objective:    BP (!) 152/92    Pulse 91    Temp 98.1 F (36.7 C)    Wt 173 lb 6.4 oz (78.7 kg)    SpO2 97%    BMI 27.15 kg/m   Wt Readings from Last 3 Encounters:  08/12/21 173 lb 6.4 oz (78.7 kg)  07/21/21 174 lb 9.6 oz (79.2 kg)  03/30/21 163 lb (73.9 kg)    Physical Exam Vitals and nursing note reviewed.  Constitutional:      General: She is not in acute distress.    Appearance: Normal appearance. She is not ill-appearing, toxic-appearing or diaphoretic.  HENT:     Head: Normocephalic and atraumatic.     Right Ear: External ear normal.     Left Ear: External ear normal.     Nose: Nose normal.     Mouth/Throat:     Mouth: Mucous membranes are moist.     Pharynx: Oropharynx is clear.  Eyes:     General: No scleral icterus.       Right eye: No discharge.        Left eye: No discharge.     Extraocular Movements: Extraocular movements intact.     Conjunctiva/sclera: Conjunctivae normal.     Pupils: Pupils are equal, round, and reactive to light.  Cardiovascular:     Rate and Rhythm: Normal rate and regular rhythm.     Pulses: Normal pulses.     Heart sounds: Normal heart sounds. No murmur heard.   No friction rub. No gallop.  Pulmonary:  Effort: Pulmonary effort is normal. No respiratory distress.     Breath sounds: Normal breath sounds. No stridor. No wheezing, rhonchi or rales.  Chest:     Chest wall: No tenderness.  Musculoskeletal:        General: Normal range of motion.     Cervical back: Normal range of motion and neck supple.  Skin:    General: Skin is warm and dry.     Capillary Refill: Capillary refill takes less than 2 seconds.     Coloration: Skin is not jaundiced or pale.     Findings: No bruising, erythema, lesion or rash.  Neurological:     General: No focal deficit present.     Mental Status: She is alert and oriented to person, place, and time.  Mental status is at baseline.  Psychiatric:        Mood and Affect: Mood normal.        Behavior: Behavior normal.        Thought Content: Thought content normal.        Judgment: Judgment normal.    Results for orders placed or performed in visit on 01/19/21  Microscopic Examination   Urine  Result Value Ref Range   WBC, UA None seen 0 - 5 /hpf   RBC 0-2 0 - 2 /hpf   Epithelial Cells (non renal) None seen 0 - 10 /hpf   Bacteria, UA Few (A) None seen/Few  Comp Met (CMET)  Result Value Ref Range   Glucose 88 65 - 99 mg/dL   BUN 15 6 - 24 mg/dL   Creatinine, Ser 0.72 0.57 - 1.00 mg/dL   eGFR 97 >59 mL/min/1.73   BUN/Creatinine Ratio 21 9 - 23   Sodium 141 134 - 144 mmol/L   Potassium 4.5 3.5 - 5.2 mmol/L   Chloride 103 96 - 106 mmol/L   CO2 23 20 - 29 mmol/L   Calcium 9.1 8.7 - 10.2 mg/dL   Total Protein 7.1 6.0 - 8.5 g/dL   Albumin 4.5 3.8 - 4.9 g/dL   Globulin, Total 2.6 1.5 - 4.5 g/dL   Albumin/Globulin Ratio 1.7 1.2 - 2.2   Bilirubin Total 0.3 0.0 - 1.2 mg/dL   Alkaline Phosphatase 69 44 - 121 IU/L   AST 54 (H) 0 - 40 IU/L   ALT 45 (H) 0 - 32 IU/L  CBC w/Diff  Result Value Ref Range   WBC 7.7 3.4 - 10.8 x10E3/uL   RBC 4.32 3.77 - 5.28 x10E6/uL   Hemoglobin 12.9 11.1 - 15.9 g/dL   Hematocrit 39.1 34.0 - 46.6 %   MCV 91 79 - 97 fL   MCH 29.9 26.6 - 33.0 pg   MCHC 33.0 31.5 - 35.7 g/dL   RDW 12.6 11.7 - 15.4 %   Platelets 312 150 - 450 x10E3/uL   Neutrophils 49 Not Estab. %   Lymphs 42 Not Estab. %   Monocytes 7 Not Estab. %   Eos 1 Not Estab. %   Basos 1 Not Estab. %   Neutrophils Absolute 3.8 1.4 - 7.0 x10E3/uL   Lymphocytes Absolute 3.3 (H) 0.7 - 3.1 x10E3/uL   Monocytes Absolute 0.5 0.1 - 0.9 x10E3/uL   EOS (ABSOLUTE) 0.1 0.0 - 0.4 x10E3/uL   Basophils Absolute 0.1 0.0 - 0.2 x10E3/uL   Immature Granulocytes 0 Not Estab. %   Immature Grans (Abs) 0.0 0.0 - 0.1 x10E3/uL  Urinalysis, Routine w reflex microscopic  Result Value Ref Range   Specific Gravity,  UA 1.025 1.005 - 1.030   pH, UA 5.5 5.0 - 7.5   Color, UA Yellow Yellow   Appearance Ur Clear Clear   Leukocytes,UA Negative Negative   Protein,UA Negative Negative/Trace   Glucose, UA Negative Negative   Ketones, UA Negative Negative   RBC, UA 1+ (A) Negative   Bilirubin, UA Negative Negative   Urobilinogen, Ur 0.2 0.2 - 1.0 mg/dL   Nitrite, UA Negative Negative   Microscopic Examination See below:   Lipid Profile  Result Value Ref Range   Cholesterol, Total 218 (H) 100 - 199 mg/dL   Triglycerides 64 0 - 149 mg/dL   HDL 85 >39 mg/dL   VLDL Cholesterol Cal 11 5 - 40 mg/dL   LDL Chol Calc (NIH) 122 (H) 0 - 99 mg/dL   Chol/HDL Ratio 2.6 0.0 - 4.4 ratio  TSH  Result Value Ref Range   TSH 2.000 0.450 - 4.500 uIU/mL      Assessment & Plan:   Problem List Items Addressed This Visit       Cardiovascular and Mediastinum   Essential hypertension    Feels like she has been having palpitations since being started on the valsartan. BP running high today. Will change her back to [facility] and recheck 2 weeks. May need beta-blocker if palpitations continue.       Relevant Medications   olmesartan (BENICAR) 40 MG tablet     Musculoskeletal and Integument   Synovial cyst of lumbar spine    Follow up with ortho as scheduled. They are managing.       Other Visit Diagnoses     Palpitations    -  Primary   EKG normal. Will change her valsartan back to olmesartan and rehcek in 2 weeks. Call wiht any concerns.    Relevant Orders   EKG 12-Lead (Completed)        Follow up plan: Return 2 weeks with PCP.

## 2021-08-12 NOTE — Assessment & Plan Note (Signed)
Follow up with ortho as scheduled. They are managing.

## 2021-08-12 NOTE — Assessment & Plan Note (Signed)
Feels like she has been having palpitations since being started on the valsartan. BP running high today. Will change her back to [facility] and recheck 2 weeks. May need beta-blocker if palpitations continue.

## 2021-09-13 ENCOUNTER — Telehealth: Payer: Self-pay | Admitting: Nurse Practitioner

## 2021-09-13 ENCOUNTER — Ambulatory Visit: Payer: Self-pay

## 2021-09-13 DIAGNOSIS — F419 Anxiety disorder, unspecified: Secondary | ICD-10-CM

## 2021-09-13 NOTE — Telephone Encounter (Signed)
Patient notified of Karen's message. Patient requested to come in to discuss medications with Clydie Braun. Appointment scheduled for 09/21/21.  ?

## 2021-09-13 NOTE — Telephone Encounter (Signed)
Copied from CRM [id number]. Topic: Referral - Request for Referral ?>> Sep 13, 2021  8:09 AM Wyonia Hough E wrote: ?Has patient seen PCP for this complaint? No  ?*If NO, is insurance requiring patient see PCP for this issue before PCP can refer them? ?Referral for which specialty: Psychiatry  ?Preferred provider/office: any office under her insurance  ?Reason for referral: for anxiety from work ?

## 2021-09-13 NOTE — Telephone Encounter (Signed)
? ? ?  Chief Complaint: Having more anxiety,"a lot of it is from work." States she has talked to PCP about this. ?Symptoms: Palpitations, difficulty sleeping ?Frequency: Started several months ago, getting worse. ?Pertinent Negatives: Patient denies  ?Disposition: [] ED /[] Urgent Care (no appt availability in office) / [] Appointment(In office/virtual)/ []  [facility]/ [] Home Care/ [] Refused Recommended Disposition /[] [facility]/ [x]  Follow-up with PCP ?Additional Notes: Pt. Would like a referral to "someone to help me with my anxiety." Please advise pt.  ?Answer Assessment - Initial Assessment Questions ?1. CONCERN: "Did anything happen that prompted you to call today?"  ?    Anxiety ?2. ANXIETY SYMPTOMS: "Can you describe how you (your loved one; patient) have been feeling?" (e.g., tense, restless, panicky, anxious, keyed up, overwhelmed, sense of impending doom).  ?    Anxiety ?3. ONSET: "How long have you been feeling this way?" (e.g., hours, days, weeks) ?    Months ?4. SEVERITY: "How would you rate the level of anxiety?" (e.g., 0 - 10; or mild, moderate, severe). ?    Moderate ?5. FUNCTIONAL IMPAIRMENT: "How have these feelings affected your ability to do daily activities?" "Have you had more difficulty than usual doing your normal daily activities?" (e.g., getting better, same, worse; self-care, school, work, interactions) ?    Getting worse ?6. HISTORY: "Have you felt this way before?" "Have you ever been diagnosed with an anxiety problem in the past?" (e.g., generalized anxiety disorder, panic attacks, PTSD). If Yes, ask: "How was this problem treated?" (e.g., medicines, counseling, etc.) ?    Yes ?7. RISK OF HARM - SUICIDAL IDEATION: "Do you ever have thoughts of hurting or killing yourself?" If Yes, ask:  "Do you have these feelings now?" "Do you have a plan on how you would do this?" ?    No ?8. TREATMENT:  "What has been done so far to treat this anxiety?" (e.g., medicines,  relaxation strategies). "What has helped?" ?    Yes ?9. TREATMENT - THERAPIST: "Do you have a counselor or therapist? Name?" ?    No ?10. POTENTIAL TRIGGERS: "Do you drink caffeinated beverages (e.g., coffee, colas, teas), and how much daily?" "Do you drink alcohol or use any drugs?" "Have you started any new medicines recently?" ?    No ?10. PATIENT SUPPORT: "Who is with you now?" "Who do you live with?" "Do you have family or friends who you can talk to?"  ?      Yes ?11. OTHER SYMPTOMS: "Do you have any other symptoms?" (e.g., feeling depressed, trouble concentrating, trouble sleeping, trouble breathing, palpitations or fast heartbeat, chest pain, sweating, nausea, or diarrhea) ?      Difficulty sleeping, palpitations ?12. PREGNANCY: "Is there any chance you are pregnant?" "When was your last menstrual period?" ?      No ? ?Protocols used: Anxiety and Panic Attack-A-AH ? ?

## 2021-09-21 ENCOUNTER — Ambulatory Visit: Payer: BC Managed Care – PPO | Admitting: Nurse Practitioner

## 2021-09-21 ENCOUNTER — Encounter: Payer: Self-pay | Admitting: Nurse Practitioner

## 2021-09-21 VITALS — BP 132/86 | HR 88 | Temp 98.6°F | Wt 177.2 lb

## 2021-09-21 DIAGNOSIS — F419 Anxiety disorder, unspecified: Secondary | ICD-10-CM

## 2021-09-21 MED ORDER — VENLAFAXINE HCL ER 37.5 MG PO CP24: 37.5000 mg | ORAL_CAPSULE | Freq: Every day | ORAL | 0 refills | Status: DC

## 2021-09-21 NOTE — Progress Notes (Signed)
? ?BP 132/86   Pulse 88   Temp 98.6 ?F (37 ?C) (Oral)   Wt 177 lb 3.2 oz (80.4 kg)   SpO2 96%   BMI 27.75 kg/m?   ? ?Subjective:  ? ? Patient ID: Carolyn Wood, female    DOB: 11-Mar-1961, 61 y.o.   MRN: [id number] ? ?HPI: ?Carolyn Wood is a 61 y.o. female ? ?Chief Complaint  ?Patient presents with  ? Anxiety  ?  Patient reports falling down recently and its causing her to have anxiety. Anxiety ongoing for the last year, but has been worsening.   ? ?ANXIETY ?Patient states she still hasn't had the surgery to remove the cyst from her spine.  She has fallen twice in the last two weeks.  States because of her back her legs get numb from the cyst pushing on the nerve.  This is what is making her fall.  She is working to get a cane through orthopedics.  Sitting puts pressure on her back.  Pain has been ongoing for 2 years.  She would like to see a therapist for her mental health.  ? ?[facility] Visit from 09/21/2021 in [place]  ?PHQ-9 Total Score 12  ? ?  ? ? ?  08/12/2021  ?  2:55 PM 07/21/2021  ?  3:27 PM 05/26/2020  ?  1:32 PM 08/13/2019  ?  4:00 PM  ?GAD 7 : Generalized Anxiety Score  ?Nervous, Anxious, on Edge _0 ?Control/stop worrying 0 _1 ?Worry too much - different things _2 ?Trouble relaxing _3 ?Restless 0 0 2 0  ?Easily annoyed or irritable 0 _4 ?Afraid - awful might happen _5 ?Total GAD 7 Score _6 ?Anxiety Difficulty  Not difficult at all Somewhat difficult Somewhat difficult  ? ? ? ? ?Relevant past medical, surgical, family and social history reviewed and updated as indicated. Interim medical history since our last visit reviewed. ?Allergies and medications reviewed and updated. ? ?Review of Systems  ?Neurological:  Positive for numbness.  ?     Falls  ?Psychiatric/Behavioral:  The patient is nervous/anxious.   ? ?Per HPI unless specifically indicated above ? ?   ?Objective:  ?  ?BP 132/86   Pulse 88   Temp 98.6 ?F (37 ?C) (Oral)    Wt 177 lb 3.2 oz (80.4 kg)   SpO2 96%   BMI 27.75 kg/m?   ?Wt Readings from Last 3 Encounters:  ?09/21/21 177 lb 3.2 oz (80.4 kg)  ?08/12/21 173 lb 6.4 oz (78.7 kg)  ?07/21/21 174 lb 9.6 oz (79.2 kg)  ?  ?Physical Exam ?Vitals and nursing note reviewed.  ?Constitutional:   ?   General: She is not in acute distress. ?   Appearance: Normal appearance. She is normal weight. She is not ill-appearing, toxic-appearing or diaphoretic.  ?HENT:  ?   Head: Normocephalic.  ?   Right Ear: External ear normal.  ?   Left Ear: External ear normal.  ?   Nose: Nose normal.  ?   Mouth/Throat:  ?   Mouth: Mucous membranes are moist.  ?   Pharynx: Oropharynx is clear.  ?Eyes:  ?   General:     ?   Right eye: No discharge.     ?   Left eye: No discharge.  ?   Extraocular Movements: Extraocular movements intact.  ?  Conjunctiva/sclera: Conjunctivae normal.  ?   Pupils: Pupils are equal, round, and reactive to light.  ?Cardiovascular:  ?   Rate and Rhythm: Normal rate and regular rhythm.  ?   Heart sounds: No murmur heard. ?Pulmonary:  ?   Effort: Pulmonary effort is normal. No respiratory distress.  ?   Breath sounds: Normal breath sounds. No wheezing or rales.  ?Musculoskeletal:  ?   Cervical back: Normal range of motion and neck supple.  ?Skin: ?   General: Skin is warm and dry.  ?   Capillary Refill: Capillary refill takes less than 2 seconds.  ?Neurological:  ?   General: No focal deficit present.  ?   Mental Status: She is alert and oriented to person, place, and time. Mental status is at baseline.  ?Psychiatric:     ?   Mood and Affect: Mood normal.     ?   Behavior: Behavior normal.     ?   Thought Content: Thought content normal.     ?   Judgment: Judgment normal.  ? ? ?Results for orders placed or performed in visit on 01/19/21  ?Microscopic Examination  ? Urine  ?Result Value Ref Range  ? WBC, UA None seen 0 - 5 /hpf  ? RBC 0-2 0 - 2 /hpf  ? Epithelial Cells (non renal) None seen 0 - 10 /hpf  ? Bacteria, UA Few (A) None  seen/Few  ?Comp Met (CMET)  ?Result Value Ref Range  ? Glucose 88 65 - 99 mg/dL  ? BUN 15 6 - 24 mg/dL  ? Creatinine, Ser 0.72 0.57 - 1.00 mg/dL  ? eGFR 97 >59 mL/min/1.73  ? BUN/Creatinine Ratio 21 9 - 23  ? Sodium 141 134 - 144 mmol/L  ? Potassium 4.5 3.5 - 5.2 mmol/L  ? Chloride 103 96 - 106 mmol/L  ? CO2 23 20 - 29 mmol/L  ? Calcium 9.1 8.7 - 10.2 mg/dL  ? Total Protein 7.1 6.0 - 8.5 g/dL  ? Albumin 4.5 3.8 - 4.9 g/dL  ? Globulin, Total 2.6 1.5 - 4.5 g/dL  ? Albumin/Globulin Ratio 1.7 1.2 - 2.2  ? Bilirubin Total 0.3 0.0 - 1.2 mg/dL  ? Alkaline Phosphatase 69 44 - 121 IU/L  ? AST 54 (H) 0 - 40 IU/L  ? ALT 45 (H) 0 - 32 IU/L  ?CBC w/Diff  ?Result Value Ref Range  ? WBC 7.7 3.4 - 10.8 x10E3/uL  ? RBC 4.32 3.77 - 5.28 x10E6/uL  ? Hemoglobin 12.9 11.1 - 15.9 g/dL  ? Hematocrit 39.1 34.0 - 46.6 %  ? MCV 91 79 - 97 fL  ? MCH 29.9 26.6 - 33.0 pg  ? MCHC 33.0 31.5 - 35.7 g/dL  ? RDW 12.6 11.7 - 15.4 %  ? Platelets 312 150 - 450 x10E3/uL  ? Neutrophils 49 Not Estab. %  ? Lymphs 42 Not Estab. %  ? Monocytes 7 Not Estab. %  ? Eos 1 Not Estab. %  ? Basos 1 Not Estab. %  ? Neutrophils Absolute 3.8 1.4 - 7.0 x10E3/uL  ? Lymphocytes Absolute 3.3 (H) 0.7 - 3.1 x10E3/uL  ? Monocytes Absolute 0.5 0.1 - 0.9 x10E3/uL  ? EOS (ABSOLUTE) 0.1 0.0 - 0.4 x10E3/uL  ? Basophils Absolute 0.1 0.0 - 0.2 x10E3/uL  ? Immature Granulocytes 0 Not Estab. %  ? Immature Grans (Abs) 0.0 0.0 - 0.1 x10E3/uL  ?Urinalysis, Routine w reflex microscopic  ?Result Value Ref Range  ? Specific Gravity, UA 1.025 1.005 -  1.030  ? pH, UA 5.5 5.0 - 7.5  ? Color, UA Yellow Yellow  ? Appearance Ur Clear Clear  ? Leukocytes,UA Negative Negative  ? Protein,UA Negative Negative/Trace  ? Glucose, UA Negative Negative  ? Ketones, UA Negative Negative  ? RBC, UA 1+ (A) Negative  ? Bilirubin, UA Negative Negative  ? Urobilinogen, Ur 0.2 0.2 - 1.0 mg/dL  ? Nitrite, UA Negative Negative  ? Microscopic Examination See below:   ?Lipid Profile  ?Result Value Ref Range  ?  Cholesterol, Total 218 (H) 100 - 199 mg/dL  ? Triglycerides 64 0 - 149 mg/dL  ? HDL 85 >39 mg/dL  ? VLDL Cholesterol Cal 11 5 - 40 mg/dL  ? LDL Chol Calc (NIH) 122 (H) 0 - 99 mg/dL  ? Chol/HDL Ratio 2.6 0.0 - 4.4 ratio  ?TSH  ?Result Value Ref Range  ? TSH 2.000 0.450 - 4.500 uIU/mL  ? ?   ?Assessment & Plan:  ? ?Problem List Items Addressed This Visit   ? ?  ? Other  ? Anxiety - Primary  ?  Chronic. Not well controlled.  Has a lot of stress over her back pain from her work injury.  Will start effexor 37.42m daily.  Side effects and benefits of medication discussed during visit.  Referral placed for therapy.  Follow up in 1 month for reevaluation.  ?  ?  ? Relevant Medications  ? venlafaxine XR (EFFEXOR XR) 37.5 MG 24 hr capsule  ? Other Relevant Orders  ? Ambulatory referral to Psychology  ?  ? ?Follow up plan: ?Return in about 1 month (around 10/22/2021) for Depression/Anxiety FU. ? ? ? ? ? ?

## 2021-09-21 NOTE — Assessment & Plan Note (Signed)
Chronic. Not well controlled.  Has a lot of stress over her back pain from her work injury.  Will start effexor 37.5mg  daily.  Side effects and benefits of medication discussed during visit.  Referral placed for therapy.  Follow up in 1 month for reevaluation.  ?

## 2021-10-19 ENCOUNTER — Telehealth: Payer: Self-pay | Admitting: Family Medicine

## 2021-10-19 NOTE — Telephone Encounter (Signed)
Called number listed memory box full unable to leave message. Patient is due for annual in May ?

## 2021-10-26 ENCOUNTER — Ambulatory Visit: Payer: BC Managed Care – PPO | Admitting: Nurse Practitioner

## 2021-10-26 ENCOUNTER — Encounter: Payer: Self-pay | Admitting: Nurse Practitioner

## 2021-10-26 VITALS — BP 114/73 | HR 83 | Temp 97.9°F | Wt 178.0 lb

## 2021-10-26 DIAGNOSIS — F419 Anxiety disorder, unspecified: Secondary | ICD-10-CM

## 2021-10-26 DIAGNOSIS — I10 Essential (primary) hypertension: Secondary | ICD-10-CM

## 2021-10-26 MED ORDER — LOSARTAN POTASSIUM 100 MG PO TABS: 100.0000 mg | ORAL_TABLET | Freq: Every day | ORAL | 1 refills | Status: AC

## 2021-10-26 MED ORDER — VENLAFAXINE HCL ER 37.5 MG PO CP24: 37.5000 mg | ORAL_CAPSULE | Freq: Every day | ORAL | 1 refills | Status: DC

## 2021-10-26 NOTE — Assessment & Plan Note (Signed)
Chronic.  Controlled.  Continue with current medication regimen of Losartan 100mg  daily.    Return to clinic in 3 months for reevaluation.  Call sooner if concerns arise.  ? ?

## 2021-10-26 NOTE — Progress Notes (Signed)
? ?BP 114/73   Pulse 83   Temp 97.9 ?F (36.6 ?C) (Oral)   Wt 178 lb (80.7 kg)   SpO2 98%   BMI 27.87 kg/m?   ? ?Subjective:  ? ? Patient ID: Carolyn Wood, female    DOB: Jan 29, 1961, 61 y.o.   MRN: [id number] ? ?HPI: ?Carolyn Wood is a 62 y.o. female ? ?Chief Complaint  ?Patient presents with  ? Anxiety  ? Depression  ?  Pt reports needing refill on losartan & effexor   ? ?ANXIETY ?Patient states she is doing better since she started on the Effexor.  Patient states she is not as worried about the little things.  She doesn't have an appointment with a therapist but filled out the paperwork and sent it back.   ? ?[place] Office Visit from 10/26/2021 in [place]  ?PHQ-9 Total Score 7  ? ?  ? ? ?  10/26/2021  ?  3:41 PM 08/12/2021  ?  2:55 PM 07/21/2021  ?  3:27 PM 05/26/2020  ?  1:32 PM  ?GAD 7 : Generalized Anxiety Score  ?Nervous, Anxious, on Edge _0 ?Control/stop worrying 1 0 1 3  ?Worry too much - different things _1 ?Trouble relaxing _2 ?Restless 1 0 0 2  ?Easily annoyed or irritable 3 0 1 2  ?Afraid - awful might happen _3 ?Total GAD 7 Score _4 ?Anxiety Difficulty Very difficult  Not difficult at all Somewhat difficult  ? ? ? ? ?Relevant past medical, surgical, family and social history reviewed and updated as indicated. Interim medical history since our last visit reviewed. ?Allergies and medications reviewed and updated. ? ?Review of Systems  ?Neurological:   ?     Falls  ?Psychiatric/Behavioral:  The patient is nervous/anxious.   ? ?Per HPI unless specifically indicated above ? ?   ?Objective:  ?  ?BP 114/73   Pulse 83   Temp 97.9 ?F (36.6 ?C) (Oral)   Wt 178 lb (80.7 kg)   SpO2 98%   BMI 27.87 kg/m?   ?Wt Readings from Last 3 Encounters:  ?10/26/21 178 lb (80.7 kg)  ?09/21/21 177 lb 3.2 oz (80.4 kg)  ?08/12/21 173 lb 6.4 oz (78.7 kg)  ?  ?Physical Exam ?Vitals and nursing note reviewed.  ?Constitutional:   ?   General: She is not in acute  distress. ?   Appearance: Normal appearance. She is normal weight. She is not ill-appearing, toxic-appearing or diaphoretic.  ?HENT:  ?   Head: Normocephalic.  ?   Right Ear: External ear normal.  ?   Left Ear: External ear normal.  ?   Nose: Nose normal.  ?   Mouth/Throat:  ?   Mouth: Mucous membranes are moist.  ?   Pharynx: Oropharynx is clear.  ?Eyes:  ?   General:     ?   Right eye: No discharge.     ?   Left eye: No discharge.  ?   Extraocular Movements: Extraocular movements intact.  ?   Conjunctiva/sclera: Conjunctivae normal.  ?   Pupils: Pupils are equal, round, and reactive to light.  ?Cardiovascular:  ?   Rate and Rhythm: Normal rate and regular rhythm.  ?   Heart sounds: No murmur heard. ?Pulmonary:  ?   Effort: Pulmonary effort is normal. No respiratory distress.  ?   Breath sounds: Normal breath sounds.  No wheezing or rales.  ?Musculoskeletal:  ?   Cervical back: Normal range of motion and neck supple.  ?Skin: ?   General: Skin is warm and dry.  ?   Capillary Refill: Capillary refill takes less than 2 seconds.  ?Neurological:  ?   General: No focal deficit present.  ?   Mental Status: She is alert and oriented to person, place, and time. Mental status is at baseline.  ?Psychiatric:     ?   Mood and Affect: Mood normal.     ?   Behavior: Behavior normal.     ?   Thought Content: Thought content normal.     ?   Judgment: Judgment normal.  ? ? ?Results for orders placed or performed in visit on 01/19/21  ?Microscopic Examination  ? Urine  ?Result Value Ref Range  ? WBC, UA None seen 0 - 5 /hpf  ? RBC 0-2 0 - 2 /hpf  ? Epithelial Cells (non renal) None seen 0 - 10 /hpf  ? Bacteria, UA Few (A) None seen/Few  ?Comp Met (CMET)  ?Result Value Ref Range  ? Glucose 88 65 - 99 mg/dL  ? BUN 15 6 - 24 mg/dL  ? Creatinine, Ser 0.72 0.57 - 1.00 mg/dL  ? eGFR 97 >59 mL/min/1.73  ? BUN/Creatinine Ratio 21 9 - 23  ? Sodium 141 134 - 144 mmol/L  ? Potassium 4.5 3.5 - 5.2 mmol/L  ? Chloride 103 96 - 106 mmol/L  ? CO2 23  20 - 29 mmol/L  ? Calcium 9.1 8.7 - 10.2 mg/dL  ? Total Protein 7.1 6.0 - 8.5 g/dL  ? Albumin 4.5 3.8 - 4.9 g/dL  ? Globulin, Total 2.6 1.5 - 4.5 g/dL  ? Albumin/Globulin Ratio 1.7 1.2 - 2.2  ? Bilirubin Total 0.3 0.0 - 1.2 mg/dL  ? Alkaline Phosphatase 69 44 - 121 IU/L  ? AST 54 (H) 0 - 40 IU/L  ? ALT 45 (H) 0 - 32 IU/L  ?CBC w/Diff  ?Result Value Ref Range  ? WBC 7.7 3.4 - 10.8 x10E3/uL  ? RBC 4.32 3.77 - 5.28 x10E6/uL  ? Hemoglobin 12.9 11.1 - 15.9 g/dL  ? Hematocrit 39.1 34.0 - 46.6 %  ? MCV 91 79 - 97 fL  ? MCH 29.9 26.6 - 33.0 pg  ? MCHC 33.0 31.5 - 35.7 g/dL  ? RDW 12.6 11.7 - 15.4 %  ? Platelets 312 150 - 450 x10E3/uL  ? Neutrophils 49 Not Estab. %  ? Lymphs 42 Not Estab. %  ? Monocytes 7 Not Estab. %  ? Eos 1 Not Estab. %  ? Basos 1 Not Estab. %  ? Neutrophils Absolute 3.8 1.4 - 7.0 x10E3/uL  ? Lymphocytes Absolute 3.3 (H) 0.7 - 3.1 x10E3/uL  ? Monocytes Absolute 0.5 0.1 - 0.9 x10E3/uL  ? EOS (ABSOLUTE) 0.1 0.0 - 0.4 x10E3/uL  ? Basophils Absolute 0.1 0.0 - 0.2 x10E3/uL  ? Immature Granulocytes 0 Not Estab. %  ? Immature Grans (Abs) 0.0 0.0 - 0.1 x10E3/uL  ?Urinalysis, Routine w reflex microscopic  ?Result Value Ref Range  ? Specific Gravity, UA 1.025 1.005 - 1.030  ? pH, UA 5.5 5.0 - 7.5  ? Color, UA Yellow Yellow  ? Appearance Ur Clear Clear  ? Leukocytes,UA Negative Negative  ? Protein,UA Negative Negative/Trace  ? Glucose, UA Negative Negative  ? Ketones, UA Negative Negative  ? RBC, UA 1+ (A) Negative  ? Bilirubin, UA Negative Negative  ? Urobilinogen, Ur  0.2 0.2 - 1.0 mg/dL  ? Nitrite, UA Negative Negative  ? Microscopic Examination See below:   ?Lipid Profile  ?Result Value Ref Range  ? Cholesterol, Total 218 (H) 100 - 199 mg/dL  ? Triglycerides 64 0 - 149 mg/dL  ? HDL 85 >39 mg/dL  ? VLDL Cholesterol Cal 11 5 - 40 mg/dL  ? LDL Chol Calc (NIH) 122 (H) 0 - 99 mg/dL  ? Chol/HDL Ratio 2.6 0.0 - 4.4 ratio  ?TSH  ?Result Value Ref Range  ? TSH 2.000 0.450 - 4.500 uIU/mL  ? ?   ?Assessment & Plan:   ? ?Problem List Items Addressed This Visit   ? ?  ? Cardiovascular and Mediastinum  ? Essential hypertension  ?  Chronic.  Controlled.  Continue with current medication regimen of Losartan 120m daily.    Return to clinic in 3 months for reevaluation.  Call sooner if concerns arise.  ? ? ?  ?  ? Relevant Medications  ? losartan (COZAAR) 100 MG tablet  ?  ? Other  ? Anxiety - Primary  ?  Chronic.  Controlled.  Continue with current medication regimen of Effexor 37.563m Return to clinic in 3 months for reevaluation.  Call sooner if concerns arise.  ? ? ?  ?  ? Relevant Medications  ? venlafaxine XR (EFFEXOR XR) 37.5 MG 24 hr capsule  ?  ? ?Follow up plan: ?Return in about 3 months (around 01/26/2022) for Depression/Anxiety FU. ? ? ? ? ? ?

## 2021-10-26 NOTE — Assessment & Plan Note (Signed)
Chronic.  Controlled.  Continue with current medication regimen of Effexor 37.5mg . Return to clinic in 3 months for reevaluation.  Call sooner if concerns arise.  ? ?

## 2021-11-11 ENCOUNTER — Encounter: Payer: BC Managed Care – PPO | Admitting: Nurse Practitioner

## 2021-11-23 ENCOUNTER — Ambulatory Visit (INDEPENDENT_AMBULATORY_CARE_PROVIDER_SITE_OTHER): Payer: BC Managed Care – PPO | Admitting: Nurse Practitioner

## 2021-11-23 ENCOUNTER — Encounter: Payer: Self-pay | Admitting: Nurse Practitioner

## 2021-11-23 VITALS — BP 128/28 | HR 69 | Temp 98.5°F | Ht 65.75 in | Wt 176.8 lb

## 2021-11-23 DIAGNOSIS — F419 Anxiety disorder, unspecified: Secondary | ICD-10-CM

## 2021-11-23 DIAGNOSIS — E7800 Pure hypercholesterolemia, unspecified: Secondary | ICD-10-CM | POA: Diagnosis not present

## 2021-11-23 DIAGNOSIS — Z0000 Encounter for general adult medical examination without abnormal findings: Secondary | ICD-10-CM | POA: Diagnosis not present

## 2021-11-23 DIAGNOSIS — I10 Essential (primary) hypertension: Secondary | ICD-10-CM

## 2021-11-23 LAB — URINALYSIS, ROUTINE W REFLEX MICROSCOPIC
Bilirubin, UA: NEGATIVE
Glucose, UA: NEGATIVE
Ketones, UA: NEGATIVE
Leukocytes,UA: NEGATIVE
Nitrite, UA: NEGATIVE
Protein,UA: NEGATIVE
RBC, UA: NEGATIVE
Specific Gravity, UA: 1.02 (ref 1.005–1.030)
Urobilinogen, Ur: 0.2 mg/dL (ref 0.2–1.0)
pH, UA: 6.5 (ref 5.0–7.5)

## 2021-11-23 NOTE — Progress Notes (Signed)
BP (!) 128/28   Pulse 69   Temp 98.5 F (36.9 C) (Oral)   Ht 5' 5.75" (1.67 m)   Wt 176 lb 12.8 oz (80.2 kg)   SpO2 96%   BMI 28.76 kg/m    Subjective:    Patient ID: Carolyn Wood, female    DOB: 05-23-1961, 61 y.o.   MRN: [id number]  HPI: Carolyn Wood is a 61 y.o. female presenting on 11/23/2021 for comprehensive medical examination. Current medical complaints include:none  She currently lives with: Menopausal Symptoms: no  HYPERTENSION / HYPERLIPIDEMIA Satisfied with current treatment? no Duration of hypertension: years BP monitoring frequency: not checking BP range:  BP medication side effects: no Past BP meds: losartan (cozaar) Duration of hyperlipidemia: years Cholesterol medication side effects: no Cholesterol supplements: none Past cholesterol medications: none Medication compliance: excellent compliance Aspirin: no Recent stressors: no Recurrent headaches: no Visual changes: no Palpitations: no Dyspnea: no Chest pain: no Lower extremity edema: no Dizzy/lightheaded: no  Depression Screen done today and results listed below:     10/26/2021    3:41 PM 09/21/2021    2:33 PM 08/12/2021    2:57 PM 07/21/2021    3:27 PM 11/10/2020    1:44 PM  Depression screen PHQ 2/9  Decreased Interest 1 2 0 1 0  Down, Depressed, Hopeless 1 2 1 1  0  PHQ - 2 Score 2 4 1 2  0  Altered sleeping 2 2 3 2    Tired, decreased energy 1 2 0 1   Change in appetite 1 2 1 1    Feeling bad or failure about yourself  0 0 0 0   Trouble concentrating 1 1 0 0   Moving slowly or fidgety/restless 0 0 0 0   Suicidal thoughts 0 1 0 0   PHQ-9 Score 7 12 5 6    Difficult doing work/chores Very difficult   Not difficult at all     The patient has a history of falls. I did complete a risk assessment for falls. A plan of care for falls was documented.   Past Medical History:  Past Medical History:  Diagnosis Date   Anxiety    Arthritis    Complication of anesthesia     combative when waking up   Elevated liver enzymes    History of Papanicolaou smear of cervix 11/05/2013   RNIL/NEG   HTN (hypertension)    Seasonal allergies    Synovial cyst of lumbar spine     Surgical History:  Past Surgical History:  Procedure Laterality Date   ABDOMINAL HYSTERECTOMY     AUGMENTATION MAMMAPLASTY Bilateral 1219   silicone   BUNIONECTOMY Bilateral    CESAREAN SECTION     chin implant     cyst removed     tail bone   JOINT REPLACEMENT Right    hip   KNEE ARTHROSCOPY Right 1989   "dont know what they did"   LIPOSUCTION     on neck and chin   NASAL SEPTUM SURGERY Right 07/2016   NECK SURGERY  05/26/2019   TONSILLECTOMY     TOTAL HIP ARTHROPLASTY 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   celecoxib (CELEBREX) 200 MG capsule Take 100 mg by mouth daily.    Cholecalciferol (VITAMIN D3) 125 MCG (5000 UT) CAPS Take 1 capsule by mouth daily.   Garlic 789 MG TABS Take by mouth daily.   Glucosamine HCl 1500 MG TABS    hydrOXYzine (ATARAX/VISTARIL) 10 MG tablet Take 1 tablet (10 mg total) by mouth every 8 (eight) hours as needed.   losartan (COZAAR) 100 MG tablet Take 1 tablet (100 mg total) by mouth daily.   MAGNESIUM PO Take by mouth daily.   Melatonin 10 MG TABS Take 1 tablet by mouth at bedtime.   Omega-3 Fatty Acids (OMEGA-3 EPA FISH OIL PO) Take by mouth.   traMADol (ULTRAM) 50 MG tablet Take by mouth every 6 (six) hours as needed.   valACYclovir (VALTREX) 500 MG tablet Take 1 tablet (500 mg total)  by mouth daily.   venlafaxine XR (EFFEXOR XR) 37.5 MG 24 hr capsule Take 1 capsule (37.5 mg total) by mouth daily with breakfast.   Vitamin E 200 units TABS Take by mouth.   Zinc Sulfate (ZINC 15 PO) Take by mouth.   No current facility-administered medications on file prior to visit.    Allergies:  No Known Allergies  Social History:  Social History   Socioeconomic History   Marital status: Single    Spouse name: Not on file   Number of children: Not on file   Years of education: Not on file   Highest education level: Not on file  Occupational History   Not on file  Tobacco Use   Smoking status: Former    Packs/day: 0.50    Years: 20.00    Pack years: 10.00    Types: Cigarettes, Cigars   Smokeless tobacco: Never  Vaping Use   Vaping Use: Never used  Substance and Sexual Activity   Alcohol use: Not Currently   Drug use: No   Sexual activity: Not Currently    Birth control/protection: Surgical    Comment: Hysterectomy  Other Topics Concern   Not on file  Social History Narrative   Not on file   Social Determinants of Health   Financial Resource Strain: Not on file  Food Insecurity: Not on file  Transportation Needs: Not on file  Physical Activity: Not on file  Stress: Not on file  Social Connections: Not on file  Intimate Partner Violence: Not on file   Social History   Tobacco Use  Smoking Status Former   Packs/day: 0.50   Years: 20.00   Pack years: 10.00   Types: Cigarettes, Cigars  Smokeless Tobacco Never   Social History   Substance and Sexual Activity  Alcohol Use Not Currently    Family History:  Family History  Problem Relation Age of Onset   Heart failure Mother    Cancer Mother        kidney   Diabetes Mother    Hypertension Mother    Lung cancer Maternal Grandmother    Breast cancer Neg Hx    Ovarian cancer Neg Hx     Past medical history, surgical history, medications, allergies, family history and social history reviewed with  patient today and changes made to appropriate areas of the chart.   Review of Systems  Eyes:  Negative for blurred vision and double vision.  Respiratory:  Negative for shortness of breath.  Cardiovascular:  Negative for chest pain, palpitations and leg swelling.  Neurological:  Negative for dizziness and headaches.  All other ROS negative except what is listed above and in the HPI.      Objective:    BP (!) 128/28   Pulse 69   Temp 98.5 F (36.9 C) (Oral)   Ht 5' 5.75" (1.67 m)   Wt 176 lb 12.8 oz (80.2 kg)   SpO2 96%   BMI 28.76 kg/m   Wt Readings from Last 3 Encounters:  11/23/21 176 lb 12.8 oz (80.2 kg)  10/26/21 178 lb (80.7 kg)  09/21/21 177 lb 3.2 oz (80.4 kg)    Physical Exam Vitals and nursing note reviewed.  Constitutional:      General: She is awake. She is not in acute distress.    Appearance: Normal appearance. She is well-developed and normal weight. She is not ill-appearing.  HENT:     Head: Normocephalic and atraumatic.     Right Ear: Hearing, tympanic membrane, ear canal and external ear normal. No drainage.     Left Ear: Hearing, tympanic membrane, ear canal and external ear normal. No drainage.     Nose: Nose normal.     Right Sinus: No maxillary sinus tenderness or frontal sinus tenderness.     Left Sinus: No maxillary sinus tenderness or frontal sinus tenderness.     Mouth/Throat:     Mouth: Mucous membranes are moist.     Pharynx: Oropharynx is clear. Uvula midline. No pharyngeal swelling, oropharyngeal exudate or posterior oropharyngeal erythema.  Eyes:     General: Lids are normal.        Right eye: No discharge.        Left eye: No discharge.     Extraocular Movements: Extraocular movements intact.     Conjunctiva/sclera: Conjunctivae normal.     Pupils: Pupils are equal, round, and reactive to light.     Visual Fields: Right eye visual fields normal and left eye visual fields normal.  Neck:     Thyroid: No thyromegaly.     Vascular: No  carotid bruit.     Trachea: Trachea normal.  Cardiovascular:     Rate and Rhythm: Normal rate and regular rhythm.     Heart sounds: Normal heart sounds. No murmur heard.   No gallop.  Pulmonary:     Effort: Pulmonary effort is normal. No accessory muscle usage or respiratory distress.     Breath sounds: Normal breath sounds.  Chest:  Breasts:    Right: Normal.     Left: Normal.  Abdominal:     General: Bowel sounds are normal.     Palpations: Abdomen is soft. There is no hepatomegaly or splenomegaly.     Tenderness: There is no abdominal tenderness.  Musculoskeletal:        General: Normal range of motion.     Cervical back: Normal range of motion and neck supple.     Right lower leg: No edema.     Left lower leg: No edema.  Lymphadenopathy:     Head:     Right side of head: No submental, submandibular, tonsillar, preauricular or posterior auricular adenopathy.     Left side of head: No submental, submandibular, tonsillar, preauricular or posterior auricular adenopathy.     Cervical: No cervical adenopathy.     Upper Body:     Right upper body: No supraclavicular, axillary or pectoral adenopathy.     Left upper body: No supraclavicular, axillary or  pectoral adenopathy.  Skin:    General: Skin is warm and dry.     Capillary Refill: Capillary refill takes less than 2 seconds.     Findings: No rash.  Neurological:     Mental Status: She is alert and oriented to person, place, and time.     Gait: Gait is intact.     Deep Tendon Reflexes: Reflexes are normal and symmetric.     Reflex Scores:      Brachioradialis reflexes are 2+ on the right side and 2+ on the left side.      Patellar reflexes are 2+ on the right side and 2+ on the left side. Psychiatric:        Attention and Perception: Attention normal.        Mood and Affect: Mood normal.        Speech: Speech normal.        Behavior: Behavior normal. Behavior is cooperative.        Thought Content: Thought content normal.         Judgment: Judgment normal.    Results for orders placed or performed in visit on 01/19/21  Microscopic Examination   Urine  Result Value Ref Range   WBC, UA None seen 0 - 5 /hpf   RBC 0-2 0 - 2 /hpf   Epithelial Cells (non renal) None seen 0 - 10 /hpf   Bacteria, UA Few (A) None seen/Few  Comp Met (CMET)  Result Value Ref Range   Glucose 88 65 - 99 mg/dL   BUN 15 6 - 24 mg/dL   Creatinine, Ser 0.72 0.57 - 1.00 mg/dL   eGFR 97 >59 mL/min/1.73   BUN/Creatinine Ratio 21 9 - 23   Sodium 141 134 - 144 mmol/L   Potassium 4.5 3.5 - 5.2 mmol/L   Chloride 103 96 - 106 mmol/L   CO2 23 20 - 29 mmol/L   Calcium 9.1 8.7 - 10.2 mg/dL   Total Protein 7.1 6.0 - 8.5 g/dL   Albumin 4.5 3.8 - 4.9 g/dL   Globulin, Total 2.6 1.5 - 4.5 g/dL   Albumin/Globulin Ratio 1.7 1.2 - 2.2   Bilirubin Total 0.3 0.0 - 1.2 mg/dL   Alkaline Phosphatase 69 44 - 121 IU/L   AST 54 (H) 0 - 40 IU/L   ALT 45 (H) 0 - 32 IU/L  CBC w/Diff  Result Value Ref Range   WBC 7.7 3.4 - 10.8 x10E3/uL   RBC 4.32 3.77 - 5.28 x10E6/uL   Hemoglobin 12.9 11.1 - 15.9 g/dL   Hematocrit 39.1 34.0 - 46.6 %   MCV 91 79 - 97 fL   MCH 29.9 26.6 - 33.0 pg   MCHC 33.0 31.5 - 35.7 g/dL   RDW 12.6 11.7 - 15.4 %   Platelets 312 150 - 450 x10E3/uL   Neutrophils 49 Not Estab. %   Lymphs 42 Not Estab. %   Monocytes 7 Not Estab. %   Eos 1 Not Estab. %   Basos 1 Not Estab. %   Neutrophils Absolute 3.8 1.4 - 7.0 x10E3/uL   Lymphocytes Absolute 3.3 (H) 0.7 - 3.1 x10E3/uL   Monocytes Absolute 0.5 0.1 - 0.9 x10E3/uL   EOS (ABSOLUTE) 0.1 0.0 - 0.4 x10E3/uL   Basophils Absolute 0.1 0.0 - 0.2 x10E3/uL   Immature Granulocytes 0 Not Estab. %   Immature Grans (Abs) 0.0 0.0 - 0.1 x10E3/uL  Urinalysis, Routine w reflex microscopic  Result Value Ref Range   Specific Gravity,  UA 1.025 1.005 - 1.030   pH, UA 5.5 5.0 - 7.5   Color, UA Yellow Yellow   Appearance Ur Clear Clear   Leukocytes,UA Negative Negative   Protein,UA Negative  Negative/Trace   Glucose, UA Negative Negative   Ketones, UA Negative Negative   RBC, UA 1+ (A) Negative   Bilirubin, UA Negative Negative   Urobilinogen, Ur 0.2 0.2 - 1.0 mg/dL   Nitrite, UA Negative Negative   Microscopic Examination See below:   Lipid Profile  Result Value Ref Range   Cholesterol, Total 218 (H) 100 - 199 mg/dL   Triglycerides 64 0 - 149 mg/dL   HDL 85 >39 mg/dL   VLDL Cholesterol Cal 11 5 - 40 mg/dL   LDL Chol Calc (NIH) 122 (H) 0 - 99 mg/dL   Chol/HDL Ratio 2.6 0.0 - 4.4 ratio  TSH  Result Value Ref Range   TSH 2.000 0.450 - 4.500 uIU/mL      Assessment & Plan:   Problem List Items Addressed This Visit       Cardiovascular and Mediastinum   Essential hypertension    Chronic.  Controlled.  Continue with current medication regimen.  Labs ordered today.  Return to clinic in 6 months for reevaluation.  Call sooner if concerns arise.         Relevant Orders   Comprehensive metabolic panel     Other   Anxiety    Chronic.  Controlled.  Continue with current medication regimen of Effexor 37.$RemoveBefor'5mg'[id number]$ . Return to clinic in 6 months for reevaluation.  Call sooner if concerns arise.         Elevated LDL cholesterol level    Chronic.  Controlled.  Continue with current medication regimen.  Labs ordered today.  Return to clinic in 6 months for reevaluation.  Call sooner if concerns arise.         Relevant Orders   Lipid panel   Other Visit Diagnoses     Annual physical exam    -  Primary   Health maintenance reviewed during visit today. Labs ordered. Up to date on vaccines    Relevant Orders   CBC with Differential/Platelet   Comprehensive metabolic panel   Lipid panel   TSH   Urinalysis, Routine w reflex microscopic        Follow up plan: Return in about 6 months (around 05/25/2022) for HTN, HLD, DM2 FU.   LABORATORY TESTING:  - Pap smear: up to date  IMMUNIZATIONS:   - Tdap: Tetanus vaccination status reviewed: last tetanus booster within  10 years. - Influenza: Postponed to flu season - Pneumovax: Not applicable - Prevnar: Not applicable - COVID: Refused - HPV: Not applicable - Shingrix vaccine: Refused  SCREENING: -Mammogram: Up to date  - Colonoscopy: Up to date  - Bone Density: Not applicable  -Hearing Test: Not applicable  -Spirometry: Not applicable   PATIENT COUNSELING:   Advised to take 1 mg of folate supplement per day if capable of pregnancy.   Sexuality: Discussed sexually transmitted diseases, partner selection, use of condoms, avoidance of unintended pregnancy  and contraceptive alternatives.   Advised to avoid cigarette smoking.  I discussed with the patient that most people either abstain from alcohol or drink within safe limits (<=14/week and <=4 drinks/occasion for males, <=7/weeks and <= 3 drinks/occasion for females) and that the risk for alcohol disorders and other health effects rises proportionally with the number of drinks per week and how often a drinker exceeds daily limits.  Discussed cessation/primary prevention of drug use and availability of treatment for abuse.   Diet: Encouraged to adjust caloric intake to maintain  or achieve ideal body weight, to reduce intake of dietary saturated fat and total fat, to limit sodium intake by avoiding high sodium foods and not adding table salt, and to maintain adequate dietary potassium and calcium preferably from fresh fruits, vegetables, and low-fat dairy products.    stressed the importance of regular exercise  Injury prevention: Discussed safety belts, safety helmets, smoke detector, smoking near bedding or upholstery.   Dental health: Discussed importance of regular tooth brushing, flossing, and dental visits.    NEXT PREVENTATIVE PHYSICAL DUE IN 1 YEAR. Return in about 6 months (around 05/25/2022) for HTN, HLD, DM2 FU.

## 2021-11-23 NOTE — Progress Notes (Deleted)
There were no vitals taken for this visit.   Subjective:    Patient ID: Carolyn Wood, female    DOB: [birth year], 61 y.o.   MRN: [id number]  HPI: Carolyn Wood is a 61 y.o. female presenting on 11/23/2021 for comprehensive medical examination. Current medical complaints include:{Blank single:19197::"none","***"}  He currently lives with: Interim Problems from his last visit: {Blank single:19197::"yes","no"}  HYPERTENSION / HYPERLIPIDEMIA Satisfied with current treatment? {Blank single:19197::"yes","no"} Duration of hypertension: {Blank single:19197::"chronic","months","years"} BP monitoring frequency: {Blank single:19197::"not checking","rarely","daily","weekly","monthly","a few times a day","a few times a week","a few times a month"} BP range:  BP medication side effects: {Blank single:19197::"yes","no"} Past BP meds: {Blank NWGNFAOZ:30865::"HQIO","NGEXBMWUXL","KGMWNUUVOZ/DGUYQIHKVQ","QVZDGLOV","FIEPPIRJJO","ACZYSAYTKZ/SWFU","XNATFTDDUK (bystolic)","carvedilol","chlorthalidone","clonidine","diltiazem","exforge HCT","HCTZ","irbesartan (avapro)","labetalol","lisinopril","lisinopril-HCTZ","losartan (cozaar)","methyldopa","nifedipine","olmesartan (benicar)","olmesartan-HCTZ","quinapril","ramipril","spironalactone","tekturna","valsartan","valsartan-HCTZ","verapamil"} Duration of hyperlipidemia: {Blank single:19197::"chronic","months","years"} Cholesterol medication side effects: {Blank single:19197::"yes","no"} Cholesterol supplements: {Blank multiple:19196::"none","fish oil","niacin","red yeast rice"} Past cholesterol medications: {Blank multiple:19196::"none","atorvastain (lipitor)","lovastatin (mevacor)","pravastatin (pravachol)","rosuvastatin (crestor)","simvastatin (zocor)","vytorin","fenofibrate (tricor)","gemfibrozil","ezetimide (zetia)","niaspan","lovaza"} Medication compliance: {Blank single:19197::"excellent compliance","good compliance","fair compliance","poor  compliance"} Aspirin: {Blank single:19197::"yes","no"} Recent stressors: {Blank single:19197::"yes","no"} Recurrent headaches: {Blank single:19197::"yes","no"} Visual changes: {Blank single:19197::"yes","no"} Palpitations: {Blank single:19197::"yes","no"} Dyspnea: {Blank single:19197::"yes","no"} Chest pain: {Blank single:19197::"yes","no"} Lower extremity edema: {Blank single:19197::"yes","no"} Dizzy/lightheaded: {Blank single:19197::"yes","no"}  ANXIETY  Depression Screen done today and results listed below:     10/26/2021    3:41 PM 09/21/2021    2:33 PM 08/12/2021    2:57 PM 07/21/2021    3:27 PM 11/10/2020    1:44 PM  Depression screen PHQ 2/9  Decreased Interest 1 2 0 1 0  Down, Depressed, Hopeless 1 2 1 1  0  PHQ - 2 Score 2 4 1 2  0  Altered sleeping 2 2 3 2    Tired, decreased energy 1 2 0 1   Change in appetite 1 2 1 1    Feeling bad or failure about yourself  0 0 0 0   Trouble concentrating 1 1 0 0   Moving slowly or fidgety/restless 0 0 0 0   Suicidal thoughts 0 1 0 0   PHQ-9 Score 7 12 5 6    Difficult doing work/chores Very difficult   Not difficult at all     The patient {has/does not have:19849} a history of falls. I {did/did not:19850} complete a risk assessment for falls. A plan of care for falls {was/was not:19852} documented.   Past Medical History:  Past Medical History:  Diagnosis Date   Anxiety    Arthritis    Complication of anesthesia    combative when waking up   Elevated liver enzymes    History of Papanicolaou smear of cervix 11/05/2013   RNIL/NEG   HTN (hypertension)    Seasonal allergies    Synovial cyst of lumbar spine     Surgical History:  Past Surgical History:  Procedure Laterality Date   ABDOMINAL HYSTERECTOMY     AUGMENTATION MAMMAPLASTY Bilateral 0254   silicone   BUNIONECTOMY Bilateral    CESAREAN SECTION     chin implant     cyst removed     tail bone   JOINT REPLACEMENT Right    hip   KNEE ARTHROSCOPY Right 1989   "dont  know what they did"   LIPOSUCTION     on neck and chin   NASAL SEPTUM SURGERY Right 07/2016   NECK SURGERY  05/26/2019   TONSILLECTOMY     TOTAL HIP ARTHROPLASTY 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   celecoxib (CELEBREX) 200 MG capsule Take 100 mg by mouth daily.    Cholecalciferol (VITAMIN D3) 125 MCG (5000 UT) CAPS Take 1 capsule by mouth daily.   Garlic 300 MG TABS Take by mouth daily.   Glucosamine HCl 1500 MG TABS    hydrOXYzine (ATARAX/VISTARIL) 10 MG tablet Take 1 tablet (10 mg total) by mouth every 8 (eight) hours as needed.   losartan (COZAAR) 100 MG tablet Take 1 tablet (100 mg total) by mouth daily.   MAGNESIUM PO Take by mouth daily.   Melatonin 10 MG TABS Take 1 tablet by mouth at bedtime.   Omega-3 Fatty Acids (OMEGA-3 EPA FISH OIL PO) Take by mouth.   traMADol (ULTRAM) 50 MG tablet Take by mouth every 6 (six) hours as needed.   valACYclovir (VALTREX) 500 MG tablet Take 1 tablet (500 mg total) by mouth daily.   venlafaxine XR (EFFEXOR XR) 37.5 MG 24 hr capsule Take 1 capsule (37.5 mg total) by mouth daily with breakfast.   Vitamin E 200 units TABS Take by mouth.   Zinc Sulfate (ZINC 15 PO) Take by mouth.   No current facility-administered medications on file prior to visit.    Allergies:  No Known Allergies  Social History:  Social History   Socioeconomic History   Marital status: Single    Spouse name: Not on file   Number of children: Not on file   Years of education: Not on file   Highest  education level: Not on file  Occupational History   Not on file  Tobacco Use   Smoking status: Former    Packs/day: 0.50    Years: 20.00    Pack years: 10.00    Types: Cigarettes, Cigars   Smokeless tobacco: Never  Vaping Use   Vaping Use: Never used  Substance and Sexual Activity   Alcohol use: Not Currently   Drug use: No   Sexual activity: Not Currently    Birth control/protection: Surgical    Comment: Hysterectomy  Other Topics Concern   Not on file  Social History Narrative   Not on file   Social Determinants of Health   Financial Resource Strain: Not on file  Food Insecurity: Not on file  Transportation Needs: Not on file  Physical Activity: Not on file  Stress: Not on file  Social Connections: Not on file  Intimate Partner Violence: Not on file   Social History   Tobacco Use  Smoking Status Former   Packs/day: 0.50   Years: 20.00   Pack years: 10.00   Types: Cigarettes, Cigars  Smokeless Tobacco Never   Social History   Substance and Sexual Activity  Alcohol Use Not Currently    Family History:  Family History  Problem Relation Age of Onset   Heart failure Mother    Cancer Mother        kidney   Diabetes Mother    Hypertension Mother    Lung cancer Maternal Grandmother    Breast cancer Neg Hx    Ovarian cancer Neg Hx     Past medical history, surgical history, medications, allergies, family history and social history reviewed with patient today and changes made to appropriate areas  of the chart.   ROS All other ROS negative except what is listed above and in the HPI.      Objective:    There were no vitals taken for this visit.  Wt Readings from Last 3 Encounters:  10/26/21 178 lb (80.7 kg)  09/21/21 177 lb 3.2 oz (80.4 kg)  08/12/21 173 lb 6.4 oz (78.7 kg)    Physical Exam  Results for orders placed or performed in visit on 01/19/21  Microscopic Examination   Urine  Result Value Ref Range   WBC, UA None seen 0 - 5 /hpf   RBC  0-2 0 - 2 /hpf   Epithelial Cells (non renal) None seen 0 - 10 /hpf   Bacteria, UA Few (A) None seen/Few  Comp Met (CMET)  Result Value Ref Range   Glucose 88 65 - 99 mg/dL   BUN 15 6 - 24 mg/dL   Creatinine, Ser 0.72 0.57 - 1.00 mg/dL   eGFR 97 >59 mL/min/1.73   BUN/Creatinine Ratio 21 9 - 23   Sodium 141 134 - 144 mmol/L   Potassium 4.5 3.5 - 5.2 mmol/L   Chloride 103 96 - 106 mmol/L   CO2 23 20 - 29 mmol/L   Calcium 9.1 8.7 - 10.2 mg/dL   Total Protein 7.1 6.0 - 8.5 g/dL   Albumin 4.5 3.8 - 4.9 g/dL   Globulin, Total 2.6 1.5 - 4.5 g/dL   Albumin/Globulin Ratio 1.7 1.2 - 2.2   Bilirubin Total 0.3 0.0 - 1.2 mg/dL   Alkaline Phosphatase 69 44 - 121 IU/L   AST 54 (H) 0 - 40 IU/L   ALT 45 (H) 0 - 32 IU/L  CBC w/Diff  Result Value Ref Range   WBC 7.7 3.4 - 10.8 x10E3/uL   RBC 4.32 3.77 - 5.28 x10E6/uL   Hemoglobin 12.9 11.1 - 15.9 g/dL   Hematocrit 39.1 34.0 - 46.6 %   MCV 91 79 - 97 fL   MCH 29.9 26.6 - 33.0 pg   MCHC 33.0 31.5 - 35.7 g/dL   RDW 12.6 11.7 - 15.4 %   Platelets 312 150 - 450 x10E3/uL   Neutrophils 49 Not Estab. %   Lymphs 42 Not Estab. %   Monocytes 7 Not Estab. %   Eos 1 Not Estab. %   Basos 1 Not Estab. %   Neutrophils Absolute 3.8 1.4 - 7.0 x10E3/uL   Lymphocytes Absolute 3.3 (H) 0.7 - 3.1 x10E3/uL   Monocytes Absolute 0.5 0.1 - 0.9 x10E3/uL   EOS (ABSOLUTE) 0.1 0.0 - 0.4 x10E3/uL   Basophils Absolute 0.1 0.0 - 0.2 x10E3/uL   Immature Granulocytes 0 Not Estab. %   Immature Grans (Abs) 0.0 0.0 - 0.1 x10E3/uL  Urinalysis, Routine w reflex microscopic  Result Value Ref Range   Specific Gravity, UA 1.025 1.005 - 1.030   pH, UA 5.5 5.0 - 7.5   Color, UA Yellow Yellow   Appearance Ur Clear Clear   Leukocytes,UA Negative Negative   Protein,UA Negative Negative/Trace   Glucose, UA Negative Negative   Ketones, UA Negative Negative   RBC, UA 1+ (A) Negative   Bilirubin, UA Negative Negative   Urobilinogen, Ur 0.2 0.2 - 1.0 mg/dL   Nitrite, UA Negative  Negative   Microscopic Examination See below:   Lipid Profile  Result Value Ref Range   Cholesterol, Total 218 (H) 100 - 199 mg/dL   Triglycerides 64 0 - 149 mg/dL   HDL 85 >39 mg/dL  VLDL Cholesterol Cal 11 5 - 40 mg/dL   LDL Chol Calc (NIH) 122 (H) 0 - 99 mg/dL   Chol/HDL Ratio 2.6 0.0 - 4.4 ratio  TSH  Result Value Ref Range   TSH 2.000 0.450 - 4.500 uIU/mL      Assessment & Plan:   Problem List Items Addressed This Visit       Cardiovascular and Mediastinum   Essential hypertension - Primary     Other   Anxiety   Elevated LDL cholesterol level     Discussed aspirin prophylaxis for myocardial infarction prevention and decision was {Blank single:19197::"it was not indicated","made to continue ASA","made to start ASA","made to stop ASA","that we recommended ASA, and patient refused"}  LABORATORY TESTING:  Health maintenance labs ordered today as discussed above.   The natural history of prostate cancer and ongoing controversy regarding screening and potential treatment outcomes of prostate cancer has been discussed with the patient. The meaning of a false positive PSA and a false negative PSA has been discussed. He indicates understanding of the limitations of this screening test and wishes *** to proceed with screening PSA testing.   IMMUNIZATIONS:   - Tdap: Tetanus vaccination status reviewed: {tetanus status:315746}. - Influenza: {Blank single:19197::"Up to date","Administered today","Postponed to flu season","Refused","Given elsewhere"} - Pneumovax: {Blank single:19197::"Up to date","Administered today","Not applicable","Refused","Given elsewhere"} - Prevnar: {Blank single:19197::"Up to date","Administered today","Not applicable","Refused","Given elsewhere"} - COVID: {Blank single:19197::"Up to date","Administered today","Not applicable","Refused","Given elsewhere"} - HPV: {Blank single:19197::"Up to date","Administered today","Not applicable","Refused","Given  elsewhere"} - Shingrix vaccine: {Blank single:19197::"Up to date","Administered today","Not applicable","Refused","Given elsewhere"}  SCREENING: - Colonoscopy: {Blank single:19197::"Up to date","Ordered today","Not applicable","Refused","Done elsewhere"}  Discussed with patient purpose of the colonoscopy is to detect colon cancer at curable precancerous or early stages   - AAA Screening: {Blank single:19197::"Up to date","Ordered today","Not applicable","Refused","Done elsewhere"}  -Hearing Test: {Blank single:19197::"Up to date","Ordered today","Not applicable","Refused","Done elsewhere"}  -Spirometry: {Blank single:19197::"Up to date","Ordered today","Not applicable","Refused","Done elsewhere"}   PATIENT COUNSELING:    Sexuality: Discussed sexually transmitted diseases, partner selection, use of condoms, avoidance of unintended pregnancy  and contraceptive alternatives.   Advised to avoid cigarette smoking.  I discussed with the patient that most people either abstain from alcohol or drink within safe limits (<=14/week and <=4 drinks/occasion for males, <=7/weeks and <= 3 drinks/occasion for females) and that the risk for alcohol disorders and other health effects rises proportionally with the number of drinks per week and how often a drinker exceeds daily limits.  Discussed cessation/primary prevention of drug use and availability of treatment for abuse.   Diet: Encouraged to adjust caloric intake to maintain  or achieve ideal body weight, to reduce intake of dietary saturated fat and total fat, to limit sodium intake by avoiding high sodium foods and not adding table salt, and to maintain adequate dietary potassium and calcium preferably from fresh fruits, vegetables, and low-fat dairy products.    stressed the importance of regular exercise  Injury prevention: Discussed safety belts, safety helmets, smoke detector, smoking near bedding or upholstery.   Dental health: Discussed  importance of regular tooth brushing, flossing, and dental visits.   Follow up plan: NEXT PREVENTATIVE PHYSICAL DUE IN 1 YEAR. No follow-ups on file.

## 2021-11-23 NOTE — Assessment & Plan Note (Signed)
Chronic.  Controlled.  Continue with current medication regimen.  Labs ordered today.  Return to clinic in 6 months for reevaluation.  Call sooner if concerns arise.  ? ?

## 2021-11-23 NOTE — Assessment & Plan Note (Signed)
Chronic.  Controlled.  Continue with current medication regimen of Effexor 37.5mg . Return to clinic in 6 months for reevaluation.  Call sooner if concerns arise.

## 2021-11-24 LAB — CBC WITH DIFFERENTIAL/PLATELET
Basophils Absolute: 0.1 10*3/uL (ref 0.0–0.2)
Basos: 1 %
EOS (ABSOLUTE): 0.2 10*3/uL (ref 0.0–0.4)
Eos: 2 %
Hematocrit: 36.2 % (ref 34.0–46.6)
Hemoglobin: 12.3 g/dL (ref 11.1–15.9)
Immature Grans (Abs): 0 10*3/uL (ref 0.0–0.1)
Immature Granulocytes: 0 %
Lymphocytes Absolute: 3.3 10*3/uL — ABNORMAL HIGH (ref 0.7–3.1)
Lymphs: 50 %
MCH: 30.4 pg (ref 26.6–33.0)
MCHC: 34 g/dL (ref 31.5–35.7)
MCV: 89 fL (ref 79–97)
Monocytes Absolute: 0.5 10*3/uL (ref 0.1–0.9)
Monocytes: 7 %
Neutrophils Absolute: 2.7 10*3/uL (ref 1.4–7.0)
Neutrophils: 40 %
Platelets: 275 10*3/uL (ref 150–450)
RBC: 4.05 x10E6/uL (ref 3.77–5.28)
RDW: 13.1 % (ref 11.7–15.4)
WBC: 6.8 10*3/uL (ref 3.4–10.8)

## 2021-11-24 LAB — COMPREHENSIVE METABOLIC PANEL
ALT: 26 IU/L (ref 0–32)
AST: 29 IU/L (ref 0–40)
Albumin/Globulin Ratio: 1.6 (ref 1.2–2.2)
Albumin: 4.1 g/dL (ref 3.8–4.9)
Alkaline Phosphatase: 52 IU/L (ref 44–121)
BUN/Creatinine Ratio: 13 (ref 9–23)
BUN: 8 mg/dL (ref 6–24)
Bilirubin Total: 0.3 mg/dL (ref 0.0–1.2)
CO2: 24 mmol/L (ref 20–29)
Calcium: 9.2 mg/dL (ref 8.7–10.2)
Chloride: 105 mmol/L (ref 96–106)
Creatinine, Ser: 0.63 mg/dL (ref 0.57–1.00)
Globulin, Total: 2.5 g/dL (ref 1.5–4.5)
Glucose: 90 mg/dL (ref 70–99)
Potassium: 4.5 mmol/L (ref 3.5–5.2)
Sodium: 140 mmol/L (ref 134–144)
Total Protein: 6.6 g/dL (ref 6.0–8.5)
eGFR: 103 mL/min/{1.73_m2} (ref >59)

## 2021-11-24 LAB — LIPID PANEL
Chol/HDL Ratio: 3.2 ratio (ref 0.0–4.4)
Cholesterol, Total: 215 mg/dL — ABNORMAL HIGH (ref 100–199)
HDL: 67 mg/dL (ref >39)
LDL Chol Calc (NIH): 132 mg/dL — ABNORMAL HIGH (ref 0–99)
Triglycerides: 92 mg/dL (ref 0–149)
VLDL Cholesterol Cal: 16 mg/dL (ref 5–40)

## 2021-11-24 LAB — TSH: TSH: 2.23 u[IU]/mL (ref 0.450–4.500)

## 2021-11-24 NOTE — Progress Notes (Signed)
Hi Carolyn Wood. It was nice to see you yesterday.  Your lab work looks good.  Your cholesterol is elevated.  I recommend a low fat diet. Continue with your current medication regimen.  Follow up as discussed.  Please let me know if you have any questions.

## 2021-12-09 NOTE — Progress Notes (Unsigned)
Psychiatric Initial Adult Assessment   Patient Identification: Carolyn Wood MRN:  [id number] Date of Evaluation:  12/13/2021 Referral Source: Jon Billings, NP  Chief Complaint:   Chief Complaint  Patient presents with   Establish Care   Visit Diagnosis:    ICD-10-CM   1. MDD (major depressive disorder), recurrent episode, mild ([place])  F33.0     2. Panic attacks  F41.0       History of Present Illness:   Carolyn Wood is a 61 y.o. year old female with a history of depression, anxiety, hypertension, who is referred for anxiety.   She states that she was wondering what she would tell on today's appointment.  She states that she has been struggling with physical pain.  It starts from her hip radiating to her right leg, and left hip.  She has seen 5 providers with the hope to get the surgery.  She is hiring Linna Hoff so that [employer] would cover the cost.  She states that she has been using handicapped placard, and uses cane "like an old person."  She states that although she has been prescribed opioid, she asks when she should take this as she needs to drive to go to work and she is not married and has no children.  She describes her current job as terrible.  She sits most of the time due to physical restriction she was advised by her provider.  She also reports that her black supervisor belittle her, and makes a comment like "You (white) people."  Several of the other employees has left the work.  She goes outside and doing gardening, taking care of her animals including 35 chickens, dogs and cats on weekend.  These are not enjoyable as much lately, although she still continues to do it as she needs to take care of them.   Depression-she has depressive symptoms as in PHQ-9.  She has insomnia, which she partly attributes to pain.  She reports passive SI, wondering about the purpose of life, though she denies any plan or intent.   Anxiety-she has intense anxiety with  palpitation, shortness of breath almost every day without significant triggers. She feels anxious and irritable.   Substance-she denies alcohol use or drug use.   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 She reports good relationship with her parents.  Her father died from colon cancer, and her mother from CHF in 2017.  She reports conflict with her siblings since the loss of her mother, who were "acting crazy." She feels resentment. She misses her parents every day.    Associated Signs/Symptoms: Depression Symptoms:  depressed mood, anhedonia, insomnia, fatigue, anxiety, (Hypo) Manic Symptoms:   denies decreased need for sleep, euphoria Anxiety Symptoms:   mild anxiety Psychotic Symptoms:   denies AH, VH, paranoia PTSD Symptoms: Negative  Past Psychiatric History:  Outpatient:  Psychiatry admission: denies Previous suicide attempt: denies  Past trials of medication: venlafaxine History of violence:    Previous Psychotropic Medications: Yes  Substance Abuse History in the last 12 months:  No.  Consequences of Substance Abuse: NA  Past Medical History:  Past Medical History:  Diagnosis Date   Anxiety    Arthritis    Complication of anesthesia    combative when waking up   Elevated liver enzymes    History of Papanicolaou smear of cervix 11/05/2013   RNIL/NEG   HTN (hypertension)    Seasonal allergies    Synovial cyst of lumbar spine     Past Surgical History:  Procedure Laterality Date   ABDOMINAL  HYSTERECTOMY     AUGMENTATION MAMMAPLASTY Bilateral 2015   silicone   BUNIONECTOMY Bilateral    CESAREAN SECTION     chin implant     cyst removed     tail bone   JOINT REPLACEMENT Right    hip   KNEE ARTHROSCOPY Right 1989   "dont know what they did"   LIPOSUCTION     on neck and chin   NASAL SEPTUM SURGERY Right 07/2016   NECK SURGERY  05/26/2019   TONSILLECTOMY     TOTAL HIP ARTHROPLASTY Right 10/29/2012   Procedure: RIGHT TOTAL HIP ARTHROPLASTY ANTERIOR APPROACH;  Surgeon: Shelda Pal, [place];  Location: [facility];  Service: Orthopedics;  Laterality: Right;   TOTAL HIP ARTHROPLASTY Left 06/04/2018   Procedure: LEFT TOTAL HIP ARTHROPLASTY ANTERIOR APPROACH;  Surgeon: Durene Romans, [place];  Location: [facility];  Service: Orthopedics;  Laterality: Left;  70 mins    Family Psychiatric History: as below  Family History:  Family History  Problem Relation Age of Onset   Heart failure Mother    Cancer Mother        kidney   Diabetes Mother    Hypertension Mother    Drug abuse Brother    Bipolar disorder Maternal Aunt    Lung cancer Maternal Grandmother    Breast cancer Neg Hx    Ovarian cancer Neg Hx     Social History:   Social History   Socioeconomic History   Marital status: Divorced    Spouse name: Not on file   Number of children: 2   Years of education: Not on file   Highest education level: Bachelor's degree (e.g., BA, AB, BS)  Occupational History   Not on file  Tobacco Use   Smoking status: Former    Packs/day: 0.50    Years: 20.00    Total pack years: 10.00    Types: Cigarettes, Cigars   Smokeless tobacco: Never  Vaping Use   Vaping Use: Never used  Substance and Sexual Activity   Alcohol use: Not Currently   Drug use: No   Sexual activity: Not Currently    Birth control/protection: Surgical    Comment: Hysterectomy  Other Topics Concern   Not on file  Social History Narrative   Not on file   Social Determinants of Health   Financial Resource Strain:  Not on file  Food Insecurity: Not on file  Transportation Needs: Not on file  Physical Activity: Not on file  Stress: Not on file  Social Connections: Not on file    Additional Social History: as above  Allergies:  No Known Allergies  Metabolic Disorder Labs: Lab Results  Component Value Date   HGBA1C 5.2 04/17/2018   No results found for: "PROLACTIN" Lab Results  Component Value Date   CHOL 215 (H) 11/23/2021   TRIG 92 11/23/2021   HDL 67 11/23/2021   CHOLHDL 3.2 11/23/2021   LDLCALC  132 (H) 11/23/2021   LDLCALC 122 (H) 01/19/2021   Lab Results  Component Value Date   TSH 2.230 11/23/2021    Therapeutic Level Labs: No results found for: "LITHIUM" No results found for: "CBMZ" No results found for: "VALPROATE"  Current Medications: Current Outpatient Medications  Medication Sig Dispense Refill   ASHWAGANDHA PO Take 1,200 mg by mouth at bedtime.     celecoxib (CELEBREX) 200 MG capsule Take 100 mg by mouth daily.      Cholecalciferol (VITAMIN D3) 125 MCG (5000 UT) CAPS Take 1 capsule by mouth daily.     Garlic 123XX123 MG TABS Take by mouth daily.     Glucosamine HCl 1500 MG TABS      HYDROcodone-acetaminophen (NORCO/VICODIN) 5-325 MG tablet hydrocodone 5 mg-acetaminophen 325 mg tablet  TAKE 1 TABLET BY MOUTH THREE TIMES A DAY AS NEEDED FOR 5 DAYS     hydrOXYzine (ATARAX/VISTARIL) 10 MG tablet Take 1 tablet (10 mg total) by mouth every 8 (eight) hours as needed. 30 tablet 0   losartan (COZAAR) 100 MG tablet Take 1 tablet (100 mg total) by mouth daily. 90 tablet 1   MAGNESIUM PO Take by mouth daily.     Melatonin 10 MG TABS Take 1 tablet by mouth at bedtime.     Omega-3 Fatty Acids (OMEGA-3 EPA FISH OIL PO) Take by mouth.     sertraline (ZOLOFT) 50 MG tablet 25 mg at night for one week, then 50 mg at night 30 tablet 1   valACYclovir (VALTREX) 500 MG tablet Take 1 tablet (500 mg total) by mouth daily. 90 tablet 3   Vitamin E 200 units TABS Take by mouth.     Zinc Sulfate  (ZINC 15 PO) Take by mouth.     No current facility-administered medications for this visit.    Musculoskeletal: Strength & Muscle Tone: within normal limits Gait & Station: normal Patient leans: N/A  Psychiatric Specialty Exam: Review of Systems  Psychiatric/Behavioral:  Positive for decreased concentration, dysphoric mood, sleep disturbance and suicidal ideas. Negative for agitation, behavioral problems, confusion, hallucinations and self-injury. The patient is nervous/anxious. The patient is not hyperactive.   All other systems reviewed and are negative.   Blood pressure (!) 142/89, pulse 78, temperature (!) 97.1 F (36.2 C), temperature source Temporal, weight 173 lb 6.4 oz (78.7 kg).Body mass index is 26.76 kg/m.  General Appearance: Fairly Groomed  Eye Contact:  Good  Speech:  Clear and Coherent  Volume:  Normal  Mood:  Depressed  Affect:  Appropriate, Congruent, and down  Thought Process:  Coherent  Orientation:  Full (Time, Place, and Person)  Thought Content:  Logical  Suicidal Thoughts:  No  Homicidal Thoughts:  No  Memory:  Immediate;   Good  Judgement:  Good  Insight:  Good  Psychomotor Activity:  Normal  Concentration:  Concentration: Good and Attention Span: Good  Recall:  Good  Fund of Knowledge:Good  Language: Good  Akathisia:  No  Handed:  Right  AIMS (if indicated):  not done  Assets:  Communication Skills Desire for Improvement  ADL's:  Intact  Cognition: WNL  Sleep:  Poor   Screenings: GAD-7    Flowsheet Row Office Visit from 10/26/2021 in [place] Visit from 08/12/2021 in [place] Visit from 07/21/2021 in [place] Visit from 05/26/2020 in [place] Visit from 08/13/2019 in [facility]  Total GAD-7 Score 14 4 6 18  6  PHQ2-9    Flowsheet Row Office Visit from 12/13/2021 in [facility] Office Visit from 10/26/2021 in  [facility] Office Visit from 09/21/2021 in [facility] Office Visit from 08/12/2021 in [facility] Office Visit from 07/21/2021 in [facility]  PHQ-2 Total Score 4 2 4 1 2   PHQ-9 Total Score 12 7 12 5 6       Flowsheet Row Office Visit from 12/13/2021 in [facility] ED from 12/11/2021 in [facility]  C-SSRS RISK CATEGORY Error: Q3, 4, or 5 should not be populated when Q2 is No No Risk       Assessment and Plan:  Carolyn Wood is a 61 y.o. year old female with a history of depression, anxiety, hypertension, right hip OA, who is referred for anxiety.   1. MDD (major depressive disorder), recurrent episode, mild ([facility]) 2. Panic attacks She reports worsening in depressive symptoms and anxiety for the past several months.  Psychosocial stressors includes hip pain, stress at work, conflict with her siblings, loss of her parents several years ago.  She reports symptoms of alexithymia from venlafaxine.  Will switch to sertraline to target depression , panic attacks and see if this medication mitigates its side effect.  Discussed potential risk of drowsiness, GI side effect. Will uptitrate judiciously to avoid serotonin syndrome with concomitant use of tramadol.   Plan Discontinue venlafaxine  Start sertraline 25 mg at night for one week, then 50 mg at night  Next appointment: 7/24 at 3 Pm for 30 mins, in person -on tramadol.   The patient demonstrates the following risk factors for suicide: Chronic risk factors for suicide include: psychiatric disorder of depression and chronic pain. Acute risk factors for suicide include: loss (financial, interpersonal, professional). Protective factors for this patient include: coping skills and hope for the future. Considering these factors, the overall suicide risk at this point appears to be low. Patient is appropriate for outpatient  follow up.        Collaboration of Care: Other reviewed chart  Patient/Guardian was advised Release of Information must be obtained prior to any record release in order to collaborate their care with an outside provider. Patient/Guardian was advised if they have not already done so to contact the registration department to sign all necessary forms in order for 41 to release information regarding their care.   Consent: Patient/Guardian gives verbal consent for treatment and assignment of benefits for services provided during this visit. Patient/Guardian expressed understanding and agreed to proceed.   8/24, [place] 6/20/202312:51 PM

## 2021-12-11 ENCOUNTER — Emergency Department
Admission: EM | Admit: 2021-12-11 | Discharge: 2021-12-11 | Disposition: A | Discharge status: Home / Self Care | Payer: BC Managed Care – PPO | Attending: Emergency Medicine | Admitting: Emergency Medicine

## 2021-12-11 ENCOUNTER — Other Ambulatory Visit: Payer: Self-pay

## 2021-12-11 ENCOUNTER — Emergency Department: Payer: BC Managed Care – PPO

## 2021-12-11 DIAGNOSIS — M533 Sacrococcygeal disorders, not elsewhere classified: Secondary | ICD-10-CM | POA: Diagnosis not present

## 2021-12-11 DIAGNOSIS — Z96643 Presence of artificial hip joint, bilateral: Secondary | ICD-10-CM | POA: Diagnosis not present

## 2021-12-11 DIAGNOSIS — W108XXA Fall (on) (from) other stairs and steps, initial encounter: Secondary | ICD-10-CM | POA: Insufficient documentation

## 2021-12-11 DIAGNOSIS — M545 Low back pain, unspecified: Secondary | ICD-10-CM | POA: Diagnosis not present

## 2021-12-11 DIAGNOSIS — I10 Essential (primary) hypertension: Secondary | ICD-10-CM | POA: Insufficient documentation

## 2021-12-11 LAB — CBC WITH DIFFERENTIAL/PLATELET
Abs Immature Granulocytes: 0.01 10*3/uL (ref 0.00–0.07)
Basophils Absolute: 0.1 10*3/uL (ref 0.0–0.1)
Basophils Relative: 1 %
Eosinophils Absolute: 0.2 10*3/uL (ref 0.0–0.5)
Eosinophils Relative: 3 %
HCT: 40.3 % (ref 36.0–46.0)
Hemoglobin: 12.8 g/dL (ref 12.0–15.0)
Immature Granulocytes: 0 %
Lymphocytes Relative: 46 %
Lymphs Abs: 3.2 10*3/uL (ref 0.7–4.0)
MCH: 29.5 pg (ref 26.0–34.0)
MCHC: 31.8 g/dL (ref 30.0–36.0)
MCV: 92.9 fL (ref 80.0–100.0)
Monocytes Absolute: 0.6 10*3/uL (ref 0.1–1.0)
Monocytes Relative: 8 %
Neutro Abs: 2.9 10*3/uL (ref 1.7–7.7)
Neutrophils Relative %: 42 %
Platelets: 285 10*3/uL (ref 150–400)
RBC: 4.34 MIL/uL (ref 3.87–5.11)
RDW: 13.6 % (ref 11.5–15.5)
WBC: 6.9 10*3/uL (ref 4.0–10.5)
nRBC: 0 % (ref 0.0–0.2)

## 2021-12-11 LAB — BASIC METABOLIC PANEL
Anion gap: 8 (ref 5–15)
BUN: 17 mg/dL (ref 6–20)
CO2: 22 mmol/L (ref 22–32)
Calcium: 8.8 mg/dL — ABNORMAL LOW (ref 8.9–10.3)
Chloride: 108 mmol/L (ref 98–111)
Creatinine, Ser: 0.51 mg/dL (ref 0.44–1.00)
GFR, Estimated: >60 mL/min (ref >60)
Glucose, Bld: 95 mg/dL (ref 70–99)
Potassium: 4 mmol/L (ref 3.5–5.1)
Sodium: 138 mmol/L (ref 135–145)

## 2021-12-11 NOTE — Discharge Instructions (Signed)
Your x-rays did not show any broken bone.  You likely bruised her coccyx.  Continue to take your pain medication at home and follow-up with your neurosurgeon regarding the cyst on your spine.

## 2021-12-11 NOTE — ED Provider Triage Note (Signed)
Emergency Medicine Provider Triage Evaluation Note  Carolyn Wood , a 61 y.o. female  was evaluated in triage.  Pt complains of tailbone pain radiating to elft hip since this morning. Mechanical fall feeding cats this morning. Has a cyst between L4-L5 that causes weakness and pain into right leg. Feels the the weakness has been getting worse for 1.5 years. Has noticed atrophy in her right leg 6-8 months ago. Sees Dr. Ethelene Hal at [facility] for this. No urinary or bowel problems. Uses a cane. Reports no plans for surgery at this time.  Review of Systems  Positive: Right leg weakness, left hip/tailbone pain Negative: Paresthesias, urinary/fecal incontinence  Physical Exam  BP (!) 166/94   Pulse 78   Temp 98.1 F (36.7 C) (Oral)   Resp 20   Ht 5' 7.5" (1.715 m)   Wt 76.2 kg   SpO2 96%   BMI 25.92 kg/m  Gen:   Awake, no distress   Resp:  Normal effort  MSK:   Moves extremities without difficulty  Other:    Medical Decision Making  Medically screening exam initiated at 1:17 PM.  Appropriate orders placed.  Carolyn Wood was informed that the remainder of the evaluation will be completed by another provider, this initial triage assessment does not replace that evaluation, and the importance of remaining in the ED until their evaluation is complete.     Carolyn Hoehn, PA-C 12/11/21 1322

## 2021-12-11 NOTE — ED Triage Notes (Signed)
Pt states she has a cyst between her L4 and 5- pt states she fell this morning while going down her porch steps- pt states that the pain goes down into her L hip- pt states she has not taken any medication for it d/t having to drive

## 2021-12-11 NOTE — ED Provider Notes (Signed)
[facility] Provider Note    Event Date/Time   First MD Initiated Contact with Patient 12/11/21 1432     (approximate)   History   Tailbone Pain   HPI  Carolyn Wood is a 61 y.o. female with past medical history of synovial cyst on the lumbar spine who presents with pain in the tailbone region after a fall.  Patient was going downstairs when the right leg gave out and she fell onto her buttocks and then proceeded to slide down the rest of the stairs.  Unsure if she hit her head.  She typically has low back pain that radiates down to the right leg with some associated weakness this is unchanged but she has had increasing falls related to this.  Denies bowel bladder incontinence or saddle anesthesia.  Since the fall she has been having pain lower down in the coccygeal region with some paresthesias around the left buttock which is new.  She is still able to ambulate.  Takes hydrocodone and Celebrex for pain at home.  She has seen a neurosurgeon in [place] and is planning to get surgery due to the synovial cyst that is causing the right-sided leg symptoms but is a workers comp case so is been delayed.    Past Medical History:  Diagnosis Date   Anxiety    Arthritis    Complication of anesthesia    combative when waking up   Elevated liver enzymes    History of Papanicolaou smear of cervix 11/05/2013   RNIL/NEG   HTN (hypertension)    Seasonal allergies    Synovial cyst of lumbar spine     Patient Active Problem List   Diagnosis Date Noted   Synovial cyst of lumbar spine 01/25/2021   Spondylolisthesis 01/25/2021   Elevated LDL cholesterol level 08/13/2019   DDD (degenerative disc disease), cervical 06/05/2019   Varicose veins of leg with pain, bilateral 02/14/2019   Multinodular goiter 06/11/2018   Overweight (BMI 25.0-29.9) 06/05/2018   S/P left THA, AA 06/04/2018   Osteoarthritis of hip 10/30/2017   Localized, primary osteoarthritis of  shoulder region 09/19/2017   Deviated nasal septum 03/20/2016   Nasal obstruction 03/20/2016   Essential hypertension 11/17/2015   Tinnitus of both ears 10/05/2015   Insomnia 10/05/2015   Anxiety 03/22/2015   S/P hip replacement 10/29/2012     Physical Exam  Triage Vital Signs: ED Triage Vitals  Enc Vitals Group     BP 12/11/21 1312 (!) 166/94     Pulse Rate 12/11/21 1312 78     Resp 12/11/21 1312 20     Temp 12/11/21 1312 98.1 F (36.7 C)     Temp Source 12/11/21 1312 Oral     SpO2 12/11/21 1312 96 %     Weight 12/11/21 1316 168 lb (76.2 kg)     Height 12/11/21 1316 5' 7.5" (1.715 m)     Head Circumference --      Peak Flow --      Pain Score 12/11/21 1315 7     Pain Loc --      Pain Edu? --      Excl. in GC? --     Most recent vital signs: Vitals:   12/11/21 1312  BP: (!) 166/94  Pulse: 78  Resp: 20  Temp: 98.1 F (36.7 C)  SpO2: 96%     General: Awake, no distress.  CV:  Good peripheral perfusion.  Resp:  Normal effort.  Abd:  No  distention.  Neuro:             Awake, Alert, Oriented x 3  Other:  Tenderness to palpation over the lumbar midline and tenderness to palpation over the lower sacrum slacks coccyx, no bruising 5-5 strength with hip flexion, knee flexion/knee extension, plantarflexion dorsiflexion bilaterally Patient able to ambulate   ED Results / Procedures / Treatments  Labs (all labs ordered are listed, but only abnormal results are displayed) Labs Reviewed  BASIC METABOLIC PANEL - Abnormal; Notable for the following components:      Result Value   Calcium 8.8 (*)    All other components within normal limits  CBC WITH DIFFERENTIAL/PLATELET     EKG     RADIOLOGY X-rays of the sacrum/coccyx interpreted by myself are negative for fracture   PROCEDURES:  Critical Care performed: No  Procedures    MEDICATIONS ORDERED IN ED: Medications - No data to display   IMPRESSION / MDM / ASSESSMENT AND PLAN / ED COURSE  I reviewed  the triage vital signs and the nursing notes.                              Patient's presentation is most consistent with acute complicated illness / injury requiring diagnostic workup.  Differential diagnosis includes, but is not limited to, coccyx fracture, contusion, less likely cauda equina syndrome/conus medullaris syndrome  Is a 61 year old female with a known synovial cyst on her lumbar spine with chronic right-sided radicular symptoms presents today after a fall on the stairs.  The fall occurred because her right leg gave out which has been happening frequently and she fell onto her buttocks and slid down the stairs.  She has new pain in the sacrum/coccygeal region as well as new numbness over the left buttock region but is still able to ambulate.  As far as her right leg symptoms these are fairly stable and she denies bowel or bladder incontinence saddle anesthesia or other signs of cauda equina syndrome.  Her exam overall is reassuring she is tender over the coccyx there is no over lying bruising or step-off she has good strength in her extremities bilaterally and is able to ambulate.  X-ray of the sacrum/coccyx obtained which is negative for fracture x-ray of the left hip also obtained shows prior total hip arthroplasty and is otherwise normal.  Again my suspicion for progression of her radicular symptoms to underlying cauda equina or spinal cord compression is low given her reassuring exam.  Low suspicion for occult fracture from the injury today given relatively low mechanism and that she is able to ambulate.  Discussed following up with her neurosurgeon and primary care doctor.  She ready has hydrocodone and Celebrex at home does not want anything new for pain.       FINAL CLINICAL IMPRESSION(S) / ED DIAGNOSES   Final diagnoses:  Coccygeal pain     Rx / DC Orders   ED Discharge Orders     None        Note:  This document was prepared using Dragon voice recognition  software and may include unintentional dictation errors.   Georga Hacking, MD 12/11/21 1537

## 2021-12-11 NOTE — ED Notes (Signed)
Dc ppw provided to patient. Pt questions and followup reviewed. PT declines vs at time of dc and provides verbal consent for DC. Pt alert and oriented on foot to lobby. 

## 2021-12-13 ENCOUNTER — Ambulatory Visit: Payer: BC Managed Care – PPO | Admitting: Psychiatry

## 2021-12-13 ENCOUNTER — Encounter: Payer: Self-pay | Admitting: Psychiatry

## 2021-12-13 VITALS — BP 142/89 | HR 78 | Temp 97.1°F | Wt 173.4 lb

## 2021-12-13 DIAGNOSIS — F330 Major depressive disorder, recurrent, mild: Secondary | ICD-10-CM | POA: Diagnosis not present

## 2021-12-13 DIAGNOSIS — F410 Panic disorder [episodic paroxysmal anxiety] without agoraphobia: Secondary | ICD-10-CM

## 2021-12-13 MED ORDER — SERTRALINE HCL 50 MG PO TABS: ORAL_TABLET | ORAL | 1 refills | Status: DC

## 2021-12-13 NOTE — Patient Instructions (Signed)
Discontinue venlafaxine  Start sertraline 25 mg at night for one week, then 50 mg at night  Next appointment: 7/24 at 3 PM, in person

## 2022-01-12 NOTE — Progress Notes (Deleted)
BH MD/PA/NP OP Progress Note  01/12/2022 1:00 PM Carolyn Wood  MRN:  [id number]  Chief Complaint: No chief complaint on file.  HPI: *** Visit Diagnosis: No diagnosis found.  Past Psychiatric History: Please see initial evaluation for full details. I have reviewed the history. No updates at this time.     Past Medical History:  Past Medical History:  Diagnosis Date   Anxiety    Arthritis    Complication of anesthesia    combative when waking up   Elevated liver enzymes    History of Papanicolaou smear of cervix 11/05/2013   RNIL/NEG   HTN (hypertension)    Seasonal allergies    Synovial cyst of lumbar spine     Past Surgical History:  Procedure Laterality Date   ABDOMINAL HYSTERECTOMY     AUGMENTATION MAMMAPLASTY Bilateral 2015   silicone   BUNIONECTOMY Bilateral    CESAREAN SECTION     chin implant     cyst removed     tail bone   JOINT REPLACEMENT Right    hip   KNEE ARTHROSCOPY Right 1989   "dont know what they did"   LIPOSUCTION     on neck and chin   NASAL SEPTUM SURGERY Right 07/2016   NECK SURGERY  05/26/2019   TONSILLECTOMY     TOTAL HIP ARTHROPLASTY Right 10/29/2012   Procedure: RIGHT TOTAL HIP ARTHROPLASTY ANTERIOR APPROACH;  Surgeon: Shelda Pal, [place];  Location: [facility];  Service: Orthopedics;  Laterality: Right;   TOTAL HIP ARTHROPLASTY Left 06/04/2018   Procedure: LEFT TOTAL HIP ARTHROPLASTY ANTERIOR APPROACH;  Surgeon: Durene Romans, [place];  Location: [facility];  Service: Orthopedics;  Laterality: Left;  70 mins    Family Psychiatric History: Please see initial evaluation for full details. I have reviewed the history. No updates at this time.     Family History:  Family History  Problem Relation Age of Onset   Heart failure Mother    Cancer Mother        kidney   Diabetes Mother    Hypertension Mother    Drug abuse Brother    Bipolar disorder Maternal Aunt    Lung cancer Maternal Grandmother    Breast cancer Neg Hx    Ovarian cancer Neg  Hx     Social History:  Social History   Socioeconomic History   Marital status: Divorced    Spouse name: Not on file   Number of children: 2   Years of education: Not on file   Highest education level: Bachelor's degree (e.g., BA, AB, BS)  Occupational History   Not on file  Tobacco Use   Smoking status: Former    Packs/day: 0.50    Years: 20.00    Total pack years: 10.00    Types: Cigarettes, Cigars   Smokeless tobacco: Never  Vaping Use   Vaping Use: Never used  Substance and Sexual Activity   Alcohol use: Not Currently   Drug use: No   Sexual activity: Not Currently    Birth control/protection: Surgical    Comment: Hysterectomy  Other Topics Concern   Not on file  Social History Narrative   Not on file   Social Determinants of Health   Financial Resource Strain: Not on file  Food Insecurity: Not on file  Transportation Needs: Not on file  Physical Activity: Not on file  Stress: Not on file  Social Connections: Not on file    Allergies: No Known Allergies  Metabolic Disorder Labs:  Lab Results  Component Value Date   HGBA1C 5.2 04/17/2018   No results found for: "PROLACTIN" Lab Results  Component Value Date   CHOL 215 (H) 11/23/2021   TRIG 92 11/23/2021   HDL 67 11/23/2021   CHOLHDL 3.2 11/23/2021   LDLCALC 132 (H) 11/23/2021   LDLCALC 122 (H) 01/19/2021   Lab Results  Component Value Date   TSH 2.230 11/23/2021   TSH 2.000 01/19/2021    Therapeutic Level Labs: No results found for: "LITHIUM" No results found for: "VALPROATE" No results found for: "CBMZ"  Current Medications: Current Outpatient Medications  Medication Sig Dispense Refill   ASHWAGANDHA PO Take 1,200 mg by mouth at bedtime.     celecoxib (CELEBREX) 200 MG capsule Take 100 mg by mouth daily.      Cholecalciferol (VITAMIN D3) 125 MCG (5000 UT) CAPS Take 1 capsule by mouth daily.     Garlic 100 MG TABS Take by mouth daily.     Glucosamine HCl 1500 MG TABS       HYDROcodone-acetaminophen (NORCO/VICODIN) 5-325 MG tablet hydrocodone 5 mg-acetaminophen 325 mg tablet  TAKE 1 TABLET BY MOUTH THREE TIMES A DAY AS NEEDED FOR 5 DAYS     hydrOXYzine (ATARAX/VISTARIL) 10 MG tablet Take 1 tablet (10 mg total) by mouth every 8 (eight) hours as needed. 30 tablet 0   losartan (COZAAR) 100 MG tablet Take 1 tablet (100 mg total) by mouth daily. 90 tablet 1   MAGNESIUM PO Take by mouth daily.     Melatonin 10 MG TABS Take 1 tablet by mouth at bedtime.     Omega-3 Fatty Acids (OMEGA-3 EPA FISH OIL PO) Take by mouth.     sertraline (ZOLOFT) 50 MG tablet 25 mg at night for one week, then 50 mg at night 30 tablet 1   valACYclovir (VALTREX) 500 MG tablet Take 1 tablet (500 mg total) by mouth daily. 90 tablet 3   Vitamin E 200 units TABS Take by mouth.     Zinc Sulfate (ZINC 15 PO) Take by mouth.     No current facility-administered medications for this visit.     Musculoskeletal: Strength & Muscle Tone: within normal limits Gait & Station: normal Patient leans: N/A  Psychiatric Specialty Exam: Review of Systems  There were no vitals taken for this visit.There is no height or weight on file to calculate BMI.  General Appearance: {Appearance:22683}  Eye Contact:  {BHH EYE CONTACT:22684}  Speech:  Clear and Coherent  Volume:  Normal  Mood:  {BHH MOOD:22306}  Affect:  {Affect (PAA):22687}  Thought Process:  Coherent  Orientation:  Full (Time, Place, and Person)  Thought Content: Logical   Suicidal Thoughts:  {ST/HT (PAA):22692}  Homicidal Thoughts:  {ST/HT (PAA):22692}  Memory:  Immediate;   Good  Judgement:  {Judgement (PAA):22694}  Insight:  {Insight (PAA):22695}  Psychomotor Activity:  Normal  Concentration:  Concentration: Good and Attention Span: Good  Recall:  Good  Fund of Knowledge: Good  Language: Good  Akathisia:  No  Handed:  Right  AIMS (if indicated): not done  Assets:  Communication Skills Desire for Improvement  ADL's:  Intact   Cognition: WNL  Sleep:  {BHH GOOD/FAIR/POOR:22877}   Screenings: GAD-7    Flowsheet Row Office Visit from 10/26/2021 in [facility] Office Visit from 08/12/2021 in [facility] Office Visit from 07/21/2021 in [facility] Office Visit from 05/26/2020 in [facility] Office Visit from 08/13/2019 in [facility]  Total GAD-7  Score 14 4 6 18 6       PHQ2-9    Flowsheet Row Office Visit from 12/13/2021 in [facility] Office Visit from 10/26/2021 in [facility] Office Visit from 09/21/2021 in [facility] Office Visit from 08/12/2021 in [facility] Office Visit from 07/21/2021 in [facility]  PHQ-2 Total Score 4 2 4 1 2   PHQ-9 Total Score 12 7 12 5 6       Flowsheet Row Office Visit from 12/13/2021 in [facility] ED from 12/11/2021 in [facility]  C-SSRS RISK CATEGORY Error: Q3, 4, or 5 should not be populated when Q2 is No No Risk        Assessment and Plan:  Deauna Yaw is a 61 y.o. year old female with a history of depression, anxiety, hypertension, right hip OA, who presents for follow up appointment for below.    1. MDD (major depressive disorder), recurrent episode, mild (HCC) 2. Panic attacks She reports worsening in depressive symptoms and anxiety for the past several months.  Psychosocial stressors includes hip pain, stress at work, conflict with her siblings, loss of her parents several years ago.  She reports symptoms of alexithymia from venlafaxine.  Will switch to sertraline to target depression , panic attacks and see if this medication mitigates its side effect.  Discussed potential risk of drowsiness, GI side effect. Will uptitrate judiciously to avoid serotonin syndrome with concomitant use of tramadol.     Plan Discontinue venlafaxine  Start sertraline 25 mg at  night for one week, then 50 mg at night  Next appointment: 7/24 at 3 Pm for 30 mins, in person -on tramadol.    The patient demonstrates the following risk factors for suicide: Chronic risk factors for suicide include: psychiatric disorder of depression and chronic pain. Acute risk factors for suicide include: loss (financial, interpersonal, professional). Protective factors for this patient include: coping skills and hope for the future. Considering these factors, the overall suicide risk at this point appears to be low. Patient is appropriate for outpatient follow up.              Collaboration of Care: Collaboration of Care: {BH OP Collaboration of Care:21014065}  Patient/Guardian was advised Release of Information must be obtained prior to any record release in order to collaborate their care with an outside provider. Patient/Guardian was advised if they have not already done so to contact the registration department to sign all necessary forms in order for Roselle Locus to release information regarding their care.   Consent: Patient/Guardian gives verbal consent for treatment and assignment of benefits for services provided during this visit. Patient/Guardian expressed understanding and agreed to proceed.    41, MD 01/12/2022, 1:00 PM

## 2022-01-16 ENCOUNTER — Ambulatory Visit: Payer: BC Managed Care – PPO | Admitting: Psychiatry

## 2022-01-26 ENCOUNTER — Ambulatory Visit: Payer: BC Managed Care – PPO | Admitting: Nurse Practitioner

## 2022-01-26 NOTE — Progress Notes (Signed)
BH MD/PA/NP OP Progress Note  01/30/2022 3:04 PM Carolyn Wood  MRN:  [id number]  Chief Complaint:  Chief Complaint  Patient presents with   Follow-up   HPI:  This is a follow-up appointment for depression.  She states that she had mediation, and there is an agreement for her to have surgery for back pain.  She feels glad and relieved.  She has pain in her back.  She also reports injury on her knee again.  She feels stressed at work.  She states in the chair, doing nothing.  She thinks her supervisor is trying to make her life miserable.  She is hoping to have another job after she is able to get the surgery.  She enjoys growing vegetables on her garden.  She is able to supply most of her own food.  She enjoys taking care of her cats.  She wonders if it is weird for her enjoining being around with animals, while she does not want to interact with other people.  She thinks people are mean nowadays. She has depressive symptoms as in PHQ-9.  She denies SI.  She does not think she has noticed any change since starting sertraline.  She denies any side effect, and is willing to try higher dose at this time.   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 She reports good relationship with her parents.  Her father died from colon cancer, and her mother from CHF in 2017.  She reports conflict with her siblings since the loss of her mother, who were "acting crazy." She feels resentment. She misses her parents every day.    Visit Diagnosis:    ICD-10-CM   1. MDD (major depressive disorder), recurrent episode, moderate (HCC)  F33.1       Past Psychiatric History: Please see initial evaluation for full details. I have reviewed the history. No updates at this time.      Past Medical History:  Past Medical History:  Diagnosis Date   Anxiety    Arthritis    Complication of anesthesia    combative when waking up   Elevated liver enzymes    History of Papanicolaou smear of cervix 11/05/2013   RNIL/NEG   HTN (hypertension)    Seasonal allergies    Synovial cyst of lumbar spine     Past Surgical History:  Procedure Laterality Date   ABDOMINAL HYSTERECTOMY     AUGMENTATION MAMMAPLASTY Bilateral 2015   silicone   BUNIONECTOMY Bilateral    CESAREAN SECTION     chin implant     cyst removed     tail bone   JOINT REPLACEMENT Right    hip   KNEE ARTHROSCOPY Right 1989   "dont know what they did"   LIPOSUCTION     on neck and chin   NASAL SEPTUM SURGERY Right 07/2016   NECK SURGERY  05/26/2019   TONSILLECTOMY     TOTAL HIP ARTHROPLASTY Right 10/29/2012   Procedure: RIGHT TOTAL HIP ARTHROPLASTY ANTERIOR APPROACH;  Surgeon: Shelda Pal, [place];  Location: [facility];  Service: Orthopedics;  Laterality: Right;   TOTAL HIP ARTHROPLASTY Left 06/04/2018   Procedure: LEFT TOTAL HIP ARTHROPLASTY ANTERIOR APPROACH;  Surgeon: Durene Romans, [place];  Location: [facility];  Service: Orthopedics;  Laterality: Left;  70 mins    Family Psychiatric History: Please see initial evaluation for full details. I have reviewed the history. No updates at this time.     Family History:  Family History  Problem Relation Age of Onset   Heart failure Mother    Cancer Mother        kidney   Diabetes Mother    Hypertension Mother    Drug abuse Brother    Bipolar disorder Maternal Aunt    Lung cancer Maternal Grandmother    Breast cancer Neg Hx    Ovarian cancer Neg Hx     Social History:  Social History   Socioeconomic History   Marital status: Divorced    Spouse name: Not on file   Number of children: 2   Years of education: Not on file   Highest education level: Bachelor's degree (e.g., BA, AB, BS)  Occupational History   Not on file  Tobacco Use   Smoking status:  Former    Packs/day: 0.50    Years: 20.00    Total pack years: 10.00    Types: Cigarettes, Cigars   Smokeless tobacco: Never  Vaping Use   Vaping Use: Never used  Substance and Sexual Activity   Alcohol use: Not Currently   Drug use: No   Sexual activity: Not Currently    Birth control/protection: Surgical    Comment: Hysterectomy  Other Topics Concern   Not on file  Social History Narrative   Not on file   Social Determinants of Health   Financial Resource Strain: Not on file  Food Insecurity: Not on file  Transportation Needs: Not on file  Physical Activity: Not on file  Stress: Not on file  Social Connections: Not on file    Allergies: No Known Allergies  Metabolic Disorder Labs: Lab Results  Component Value Date   HGBA1C 5.2 04/17/2018   No results found for: "PROLACTIN" Lab Results  Component Value Date   CHOL 215 (H) 11/23/2021   TRIG 92 11/23/2021   HDL 67 11/23/2021   CHOLHDL 3.2 11/23/2021   LDLCALC 132 (H) 11/23/2021   LDLCALC 122 (H) 01/19/2021   Lab Results  Component Value Date   TSH 2.230 11/23/2021   TSH 2.000 01/19/2021    Therapeutic Level Labs: No results found for: "LITHIUM" No results found for: "VALPROATE" No results found for: "CBMZ"  Current Medications: Current Outpatient Medications  Medication Sig Dispense Refill   celecoxib (CELEBREX) 200 MG capsule Take 100 mg by mouth daily.      HYDROcodone-acetaminophen (NORCO/VICODIN) 5-325 MG tablet hydrocodone 5 mg-acetaminophen 325 mg tablet  TAKE 1 TABLET BY MOUTH THREE TIMES A DAY AS NEEDED FOR 5 DAYS     hydrOXYzine (ATARAX/VISTARIL) 10 MG tablet Take 1 tablet (10 mg total) by mouth every 8 (eight) hours as needed. 30 tablet 0   losartan (COZAAR) 100 MG tablet Take 1 tablet (100 mg total) by mouth daily. 90 tablet 1   Melatonin 10 MG TABS Take 1 tablet by mouth at bedtime.     valACYclovir (VALTREX) 500 MG tablet Take 1 tablet (500 mg total) by mouth daily. 90 tablet 3    sertraline (ZOLOFT) 100 MG tablet Take 1 tablet (100 mg total) by mouth daily. 30 tablet 1   No current facility-administered medications for this visit.     Musculoskeletal: Strength & Muscle Tone: within normal limits Gait & Station: normal Patient leans: N/A  Psychiatric Specialty Exam: Review  of Systems  Psychiatric/Behavioral:  Positive for dysphoric mood. Negative for agitation, behavioral problems, confusion, decreased concentration, hallucinations, self-injury, sleep disturbance and suicidal ideas. The patient is nervous/anxious. The patient is not hyperactive.   All other systems reviewed and are negative.   Blood pressure (!) 150/89, pulse 73, temperature 98.3 F (36.8 C), temperature source Temporal, weight 169 lb (76.7 kg).Body mass index is 26.08 kg/m.  General Appearance: Fairly Groomed  Eye Contact:  Good  Speech:  Clear and Coherent  Volume:  Normal  Mood:   not changed  Affect:  Appropriate, Congruent, and calm  Thought Process:  Coherent  Orientation:  Full (Time, Place, and Person)  Thought Content: Logical   Suicidal Thoughts:  No  Homicidal Thoughts:  No  Memory:  Immediate;   Good  Judgement:  Good  Insight:  Good  Psychomotor Activity:  Normal  Concentration:  Concentration: Good and Attention Span: Good  Recall:  Good  Fund of Knowledge: Good  Language: Good  Akathisia:  No  Handed:  Right  AIMS (if indicated): not done  Assets:  Communication Skills Desire for Improvement  ADL's:  Intact  Cognition: WNL  Sleep:  Good   Screenings: GAD-7    Flowsheet Row Office Visit from 01/30/2022 in [facility] Office Visit from 10/26/2021 in [facility] Office Visit from 08/12/2021 in [facility] Office Visit from 07/21/2021 in [facility] Office Visit from 05/26/2020 in [facility]  Total GAD-7 Score 18 14 4 6 18       PHQ2-9    Flowsheet Row Office Visit from 01/30/2022 in  [facility] Office Visit from 12/13/2021 in [facility] Office Visit from 10/26/2021 in [facility] Office Visit from 09/21/2021 in [facility] Office Visit from 08/12/2021 in [facility]  PHQ-2 Total Score 4 4 2 4 1   PHQ-9 Total Score 12 12 7 12 5       Flowsheet Row Office Visit from 01/30/2022 in [facility] Office Visit from 12/13/2021 in [facility] ED from 12/11/2021 in [facility]  C-SSRS RISK CATEGORY Error: Q3, 4, or 5 should not be populated when Q2 is No Error: Q3, 4, or 5 should not be populated when Q2 is No No Risk        Assessment and Plan:  Golden Emile is a 61 y.o. year old female with a history of depression, anxiety, hypertension, right hip OA, who presents for follow up appointment for below.     1. MDD (major depressive disorder), recurrent episode, moderate (HCC) She continues to report depressive symptoms and anxiety since switching from venlafaxine to sertraline. Psychosocial stressors includes hip pain, stress at work, conflict with her siblings, loss of her parents several years ago.  Will do further uptitration of sertraline to optimize treatment for depression and anxiety. Will uptitrate judiciously to avoid serotonin syndrome with concomitant use of tramadol.   Plan Increase sertraline 50 mg at night - monitor alexithymia Next appointment: 9/26 at 3 Pm for 30 mins, in person -on tramadol.    The patient demonstrates the following risk factors for suicide: Chronic risk factors for suicide include: psychiatric disorder of depression and chronic pain. Acute risk factors for suicide include: loss (financial, interpersonal, professional). Protective factors for this patient include: coping skills and hope for the future. Considering these factors, the overall suicide risk at  this point appears to be low. Patient  is appropriate for outpatient follow up.      Collaboration of Care: Collaboration of Care: Other N/A  Patient/Guardian was advised Release of Information must be obtained prior to any record release in order to collaborate their care with an outside provider. Patient/Guardian was advised if they have not already done so to contact the registration department to sign all necessary forms in order for [place] to release information regarding their care.   Consent: Patient/Guardian gives verbal consent for treatment and assignment of benefits for services provided during this visit. Patient/Guardian expressed understanding and agreed to proceed.    Neysa Hotter, MD 01/30/2022, 3:04 PM

## 2022-01-30 ENCOUNTER — Encounter: Payer: Self-pay | Admitting: Psychiatry

## 2022-01-30 ENCOUNTER — Ambulatory Visit: Payer: BC Managed Care – PPO | Admitting: Psychiatry

## 2022-01-30 VITALS — BP 150/89 | HR 73 | Temp 98.3°F | Wt 169.0 lb

## 2022-01-30 DIAGNOSIS — F331 Major depressive disorder, recurrent, moderate: Secondary | ICD-10-CM

## 2022-01-30 MED ORDER — SERTRALINE HCL 100 MG PO TABS: 100.0000 mg | ORAL_TABLET | Freq: Every day | ORAL | 1 refills | Status: DC

## 2022-02-08 ENCOUNTER — Other Ambulatory Visit: Payer: Self-pay | Admitting: Neurosurgery

## 2022-02-16 NOTE — Progress Notes (Signed)
Surgical Instructions    Your procedure is scheduled on Monday, 02/20/2022.  Report to [facility] Main Entrance "A" at 6:00 A.M., then check in with the Admitting office.  Call this number if you have problems the morning of surgery:  [phone]   If you have any questions prior to your surgery date call [phone]: Open Monday-Friday 8am-4pm    Remember:  Do not eat or drink after midnight the night before your surgery    Take these medicines the morning of surgery with A SIP OF WATER:  IF NEEDED: HYDROcodone-acetaminophen (NORCO/VICODIN) hydrOXYzine (ATARAX/VISTARIL)  As of today, STOP taking any Aspirin (unless otherwise instructed by your surgeon) Aleve, Naproxen, Ibuprofen, Motrin, Advil, Goody's, BC's, all herbal medications, fish oil, celecoxib (CELEBREX) and all vitamins.           Do not wear jewelry or makeup. Do not wear lotions, powders, perfumes/cologne or deodorant. Do not shave 48 hours prior to surgery.   Do not bring valuables to the hospital. Do not wear nail polish, gel polish, artificial nails, or any other type of covering on natural nails (fingers and toes) If you have artificial nails or gel coating that need to be removed by a nail salon, please have this removed prior to surgery. Artificial nails or gel coating may interfere with anesthesia's ability to adequately monitor your vital signs.  [facility] is not responsible for any belongings or valuables.    Do NOT Smoke (Tobacco/Vaping)  24 hours prior to your procedure  If you use a CPAP at night, you may bring your mask for your overnight stay.   Contacts, glasses, hearing aids, dentures or partials may not be worn into surgery, please bring cases for these belongings   For patients admitted to the hospital, discharge time will be determined by your treatment team.   Patients discharged the day of surgery will not be allowed to drive home, and someone needs to stay with them for 24  hours.   SURGICAL WAITING ROOM VISITATION Patients having surgery or a procedure may have no more than 2 support people in the waiting area - these visitors may rotate.   Children under the age of 35 must have an adult with them who is not the patient. If the patient needs to stay at the hospital during part of their recovery, the visitor guidelines for inpatient rooms apply. Pre-op nurse will coordinate an appropriate time for 1 support person to accompany patient in pre-op.  This support person may not rotate.   Please refer to the [facility] website for the visitor guidelines for Inpatients (after your surgery is over and you are in a regular room).    Special instructions:    Oral Hygiene is also important to reduce your risk of infection.  Remember - BRUSH YOUR TEETH THE MORNING OF SURGERY WITH YOUR REGULAR TOOTHPASTE   [facility]- Preparing For Surgery  Before surgery, you can play an important role. Because skin is not sterile, your skin needs to be as free of germs as possible. You can reduce the number of germs on your skin by washing with CHG (chlorahexidine gluconate) Soap before surgery.  CHG is an antiseptic cleaner which kills germs and bonds with the skin to continue killing germs even after washing.     Please do not use if you have an allergy to CHG or antibacterial soaps. If your skin becomes reddened/irritated stop using the CHG.  Do not shave (including legs and underarms) for at least  48 hours prior to first CHG shower. It is OK to shave your face.  Please follow these instructions carefully.     Shower the NIGHT BEFORE SURGERY and the MORNING OF SURGERY with CHG Soap.   If you chose to wash your hair, wash your hair first as usual with your normal shampoo. After you shampoo, rinse your hair and body thoroughly to remove the shampoo.  Then [employer] and genitals (private parts) with your normal soap and rinse thoroughly to remove soap.  After that Use CHG Soap as  you would any other liquid soap. You can apply CHG directly to the skin and wash gently with a scrungie or a clean washcloth.   Apply the CHG Soap to your body ONLY FROM THE NECK DOWN.  Do not use on open wounds or open sores. Avoid contact with your eyes, ears, mouth and genitals (private parts). Wash Face and genitals (private parts)  with your normal soap.   Wash thoroughly, paying special attention to the area where your surgery will be performed.  Thoroughly rinse your body with warm water from the neck down.  DO NOT shower/wash with your normal soap after using and rinsing off the CHG Soap.  Pat yourself dry with a CLEAN TOWEL.  Wear CLEAN PAJAMAS to bed the night before surgery  Place CLEAN SHEETS on your bed the night before your surgery  DO NOT SLEEP WITH PETS.   Day of Surgery: Take a shower with CHG soap. Wear Clean/Comfortable clothing the morning of surgery Do not apply any deodorants/lotions.   Remember to brush your teeth WITH YOUR REGULAR TOOTHPASTE.    If you received a COVID test during your pre-op visit, it is requested that you wear a mask when out in public, stay away from anyone that may not be feeling well, and notify your surgeon if you develop symptoms. If you have been in contact with anyone that has tested positive in the last 10 days, please notify your surgeon.    Please read over the following fact sheets that you were given.

## 2022-02-17 ENCOUNTER — Other Ambulatory Visit: Payer: Self-pay

## 2022-02-17 ENCOUNTER — Encounter (HOSPITAL_COMMUNITY): Payer: Self-pay

## 2022-02-17 ENCOUNTER — Encounter (HOSPITAL_COMMUNITY)
Admission: RE | Admit: 2022-02-17 | Discharge: 2022-02-17 | Disposition: A | Discharge status: Home / Self Care | Payer: No Typology Code available for payment source | Source: Ambulatory Visit | Attending: Neurosurgery | Admitting: Neurosurgery

## 2022-02-17 VITALS — BP 127/84 | HR 91 | Temp 98.2°F | Resp 17 | Ht 67.5 in | Wt 170.0 lb

## 2022-02-17 DIAGNOSIS — Z01818 Encounter for other preprocedural examination: Secondary | ICD-10-CM

## 2022-02-17 DIAGNOSIS — Z01812 Encounter for preprocedural laboratory examination: Secondary | ICD-10-CM | POA: Diagnosis present

## 2022-02-17 DIAGNOSIS — I10 Essential (primary) hypertension: Secondary | ICD-10-CM | POA: Diagnosis not present

## 2022-02-17 HISTORY — DX: Other specified postprocedural states: R11.2

## 2022-02-17 HISTORY — DX: Nausea with vomiting, unspecified: Z98.890

## 2022-02-17 LAB — CBC
HCT: 39.5 % (ref 36.0–46.0)
Hemoglobin: 13.2 g/dL (ref 12.0–15.0)
MCH: 30.8 pg (ref 26.0–34.0)
MCHC: 33.4 g/dL (ref 30.0–36.0)
MCV: 92.1 fL (ref 80.0–100.0)
Platelets: 279 10*3/uL (ref 150–400)
RBC: 4.29 MIL/uL (ref 3.87–5.11)
RDW: 13.7 % (ref 11.5–15.5)
WBC: 6.9 10*3/uL (ref 4.0–10.5)
nRBC: 0 % (ref 0.0–0.2)

## 2022-02-17 LAB — BASIC METABOLIC PANEL
Anion gap: 10 (ref 5–15)
BUN: 11 mg/dL (ref 6–20)
CO2: 25 mmol/L (ref 22–32)
Calcium: 9.2 mg/dL (ref 8.9–10.3)
Chloride: 105 mmol/L (ref 98–111)
Creatinine, Ser: 0.73 mg/dL (ref 0.44–1.00)
GFR, Estimated: >60 mL/min (ref >60)
Glucose, Bld: 108 mg/dL — ABNORMAL HIGH (ref 70–99)
Potassium: 3.9 mmol/L (ref 3.5–5.1)
Sodium: 140 mmol/L (ref 135–145)

## 2022-02-17 LAB — TYPE AND SCREEN
ABO/RH(D): A POS
Antibody Screen: NEGATIVE

## 2022-02-17 LAB — SURGICAL PCR SCREEN
MRSA, PCR: NEGATIVE
Staphylococcus aureus: NEGATIVE

## 2022-02-17 NOTE — Progress Notes (Signed)
PCP - Larae Grooms Cardiologist - denies  PPM/ICD - denies   Chest x-ray - n/a EKG - 08/12/21 Stress Test - denies ECHO - denies Cardiac Cath - denies  Sleep Study - denies   NO diabetes  As of today, STOP taking any Aspirin (unless otherwise instructed by your surgeon) Aleve, Naproxen, Ibuprofen, Motrin, Advil, Goody's, BC's, all herbal medications, fish oil, celecoxib (CELEBREX) and all vitamins.  ERAS Protcol -no   COVID TEST- no   Anesthesia review: no  Patient denies shortness of breath, fever, cough and chest pain at PAT appointment   All instructions explained to the patient, with a verbal understanding of the material. Patient agrees to go over the instructions while at home for a better understanding. Patient also instructed to self quarantine after being tested for COVID-19. The opportunity to ask questions was provided.

## 2022-02-19 NOTE — Anesthesia Preprocedure Evaluation (Signed)
Anesthesia Evaluation  Patient identified by MRN, date of birth, ID band Patient awake    Reviewed: Allergy & Precautions, Patient's Chart, lab work & pertinent test results  History of Anesthesia Complications (+) PONV, Emergence Delirium and history of anesthetic complications  Airway Mallampati: I       Dental no notable dental hx.    Pulmonary former smoker,    Pulmonary exam normal        Cardiovascular hypertension, Pt. on medications Normal cardiovascular exam     Neuro/Psych PSYCHIATRIC DISORDERS Anxiety    GI/Hepatic negative GI ROS, Neg liver ROS,   Endo/Other  negative endocrine ROS  Renal/GU negative Renal ROS  negative genitourinary   Musculoskeletal  (+) Arthritis , Osteoarthritis,    Abdominal Normal abdominal exam  (+)   Peds  Hematology negative hematology ROS (+)   Anesthesia Other Findings   Reproductive/Obstetrics                            Anesthesia Physical Anesthesia Plan  ASA: 2  Anesthesia Plan: General   Post-op Pain Management: Dilaudid IV   Induction: Intravenous  PONV Risk Score and Plan: Scopolamine patch - Pre-op, Midazolam, Ondansetron and Dexamethasone  Airway Management Planned: Oral ETT  Additional Equipment: None  Intra-op Plan:   Post-operative Plan: Extubation in OR  Informed Consent: I have reviewed the patients History and Physical, chart, labs and discussed the procedure including the risks, benefits and alternatives for the proposed anesthesia with the patient or authorized representative who has indicated his/her understanding and acceptance.     Dental advisory given  Plan Discussed with: CRNA  Anesthesia Plan Comments:        Anesthesia Quick Evaluation

## 2022-02-20 ENCOUNTER — Other Ambulatory Visit: Payer: Self-pay

## 2022-02-20 ENCOUNTER — Ambulatory Visit (HOSPITAL_BASED_OUTPATIENT_CLINIC_OR_DEPARTMENT_OTHER): Payer: No Typology Code available for payment source

## 2022-02-20 ENCOUNTER — Encounter (HOSPITAL_COMMUNITY): Payer: Self-pay | Admitting: Neurosurgery

## 2022-02-20 ENCOUNTER — Ambulatory Visit (HOSPITAL_COMMUNITY): Payer: No Typology Code available for payment source

## 2022-02-20 ENCOUNTER — Observation Stay (HOSPITAL_COMMUNITY)
Admission: RE | Admit: 2022-02-20 | Discharge: 2022-02-21 | Disposition: A | Discharge status: Home / Self Care | Payer: No Typology Code available for payment source | Attending: Neurosurgery | Admitting: Neurosurgery

## 2022-02-20 ENCOUNTER — Encounter (HOSPITAL_COMMUNITY)
Admission: RE | Disposition: A | Discharge status: Home / Self Care | Payer: Self-pay | Source: Home / Self Care | Attending: Neurosurgery

## 2022-02-20 ENCOUNTER — Ambulatory Visit (HOSPITAL_COMMUNITY): Payer: BC Managed Care – PPO

## 2022-02-20 DIAGNOSIS — M7138 Other bursal cyst, other site: Secondary | ICD-10-CM

## 2022-02-20 DIAGNOSIS — Z96642 Presence of left artificial hip joint: Secondary | ICD-10-CM | POA: Diagnosis not present

## 2022-02-20 DIAGNOSIS — M4316 Spondylolisthesis, lumbar region: Principal | ICD-10-CM | POA: Insufficient documentation

## 2022-02-20 DIAGNOSIS — I10 Essential (primary) hypertension: Secondary | ICD-10-CM | POA: Diagnosis not present

## 2022-02-20 DIAGNOSIS — Z96641 Presence of right artificial hip joint: Secondary | ICD-10-CM | POA: Insufficient documentation

## 2022-02-20 DIAGNOSIS — Z87891 Personal history of nicotine dependence: Secondary | ICD-10-CM | POA: Insufficient documentation

## 2022-02-20 DIAGNOSIS — M7120 Synovial cyst of popliteal space [Baker], unspecified knee: Secondary | ICD-10-CM | POA: Insufficient documentation

## 2022-02-20 DIAGNOSIS — Z79899 Other long term (current) drug therapy: Secondary | ICD-10-CM | POA: Insufficient documentation

## 2022-02-20 DIAGNOSIS — M5416 Radiculopathy, lumbar region: Secondary | ICD-10-CM | POA: Diagnosis not present

## 2022-02-20 SURGERY — POSTERIOR LUMBAR FUSION 1 LEVEL
Anesthesia: General | Site: Back | Laterality: Bilateral

## 2022-02-20 MED ORDER — SODIUM CHLORIDE 0.9% FLUSH
3.0000 mL | Freq: Two times a day (BID) | INTRAVENOUS | Status: DC
  Administered 2022-02-20 (×2): 3 mL via INTRAVENOUS

## 2022-02-20 MED ORDER — 0.9 % SODIUM CHLORIDE (POUR BTL) OPTIME
TOPICAL | Status: DC | PRN
  Administered 2022-02-20: 1000 mL

## 2022-02-20 MED ORDER — LIDOCAINE 2% (20 MG/ML) 5 ML SYRINGE
INTRAMUSCULAR | Status: AC
  Filled 2022-02-20: qty 5

## 2022-02-20 MED ORDER — CHLORHEXIDINE GLUCONATE CLOTH 2 % EX PADS: 6.0000 | MEDICATED_PAD | Freq: Once | CUTANEOUS | Status: DC

## 2022-02-20 MED ORDER — ACETAMINOPHEN 10 MG/ML IV SOLN
INTRAVENOUS | Status: AC
  Filled 2022-02-20: qty 100

## 2022-02-20 MED ORDER — PROMETHAZINE HCL 25 MG/ML IJ SOLN
INTRAMUSCULAR | Status: AC
  Filled 2022-02-20: qty 1

## 2022-02-20 MED ORDER — PHENYLEPHRINE 80 MCG/ML (10ML) SYRINGE FOR IV PUSH (FOR BLOOD PRESSURE SUPPORT)
PREFILLED_SYRINGE | INTRAVENOUS | Status: DC | PRN
  Administered 2022-02-20 (×2): 80 ug via INTRAVENOUS
  Administered 2022-02-20 (×2): 160 ug via INTRAVENOUS
  Administered 2022-02-20: 80 ug via INTRAVENOUS

## 2022-02-20 MED ORDER — LACTATED RINGERS IV SOLN: INTRAVENOUS | Status: DC

## 2022-02-20 MED ORDER — HYDROXYZINE HCL 50 MG/ML IM SOLN: 50.0000 mg | Freq: Four times a day (QID) | INTRAMUSCULAR | Status: DC | PRN

## 2022-02-20 MED ORDER — DIAZEPAM 5 MG PO TABS
5.0000 mg | ORAL_TABLET | Freq: Four times a day (QID) | ORAL | Status: DC | PRN
  Administered 2022-02-20: 10 mg via ORAL
  Administered 2022-02-21: 5 mg via ORAL
  Filled 2022-02-20: qty 2
  Filled 2022-02-20: qty 1

## 2022-02-20 MED ORDER — FENTANYL CITRATE (PF) 100 MCG/2ML IJ SOLN
INTRAMUSCULAR | Status: AC
  Filled 2022-02-20: qty 2

## 2022-02-20 MED ORDER — DEXAMETHASONE SODIUM PHOSPHATE 10 MG/ML IJ SOLN
INTRAMUSCULAR | Status: AC
Start: 2022-02-20 — End: ?
  Filled 2022-02-20: qty 1

## 2022-02-20 MED ORDER — KETOROLAC TROMETHAMINE 30 MG/ML IJ SOLN
30.0000 mg | Freq: Once | INTRAMUSCULAR | Status: AC | PRN
  Administered 2022-02-20: 30 mg via INTRAVENOUS

## 2022-02-20 MED ORDER — BISACODYL 10 MG RE SUPP: 10.0000 mg | Freq: Every day | RECTAL | Status: DC | PRN

## 2022-02-20 MED ORDER — MIDAZOLAM HCL 2 MG/2ML IJ SOLN
INTRAMUSCULAR | Status: DC | PRN
  Administered 2022-02-20: 2 mg via INTRAVENOUS

## 2022-02-20 MED ORDER — ORAL CARE MOUTH RINSE: 15.0000 mL | Freq: Once | OROMUCOSAL | Status: AC

## 2022-02-20 MED ORDER — MEPERIDINE HCL 25 MG/ML IJ SOLN: 6.2500 mg | INTRAMUSCULAR | Status: DC | PRN

## 2022-02-20 MED ORDER — LIDOCAINE 2% (20 MG/ML) 5 ML SYRINGE
INTRAMUSCULAR | Status: DC | PRN
  Administered 2022-02-20: 80 mg via INTRAVENOUS

## 2022-02-20 MED ORDER — EPHEDRINE SULFATE-NACL 50-0.9 MG/10ML-% IV SOSY
PREFILLED_SYRINGE | INTRAVENOUS | Status: DC | PRN
  Administered 2022-02-20: 10 mg via INTRAVENOUS
  Administered 2022-02-20 (×2): 5 mg via INTRAVENOUS

## 2022-02-20 MED ORDER — HYDROXYZINE HCL 10 MG PO TABS
10.0000 mg | ORAL_TABLET | Freq: Three times a day (TID) | ORAL | Status: DC | PRN
Start: 2022-02-20 — End: 2022-02-21

## 2022-02-20 MED ORDER — FENTANYL CITRATE (PF) 100 MCG/2ML IJ SOLN
50.0000 ug | Freq: Once | INTRAMUSCULAR | Status: AC
  Administered 2022-02-20: 50 ug via INTRAVENOUS

## 2022-02-20 MED ORDER — FENTANYL CITRATE (PF) 250 MCG/5ML IJ SOLN
INTRAMUSCULAR | Status: AC
  Filled 2022-02-20: qty 5

## 2022-02-20 MED ORDER — DEXAMETHASONE SODIUM PHOSPHATE 10 MG/ML IJ SOLN
INTRAMUSCULAR | Status: DC | PRN
  Administered 2022-02-20: 10 mg via INTRAVENOUS

## 2022-02-20 MED ORDER — CEFAZOLIN SODIUM-DEXTROSE 1-4 GM/50ML-% IV SOLN
1.0000 g | Freq: Three times a day (TID) | INTRAVENOUS | Status: AC
  Administered 2022-02-20 (×2): 1 g via INTRAVENOUS
  Filled 2022-02-20 (×2): qty 50

## 2022-02-20 MED ORDER — LOSARTAN POTASSIUM 50 MG PO TABS
100.0000 mg | ORAL_TABLET | Freq: Every day | ORAL | Status: DC
  Administered 2022-02-20: 100 mg via ORAL
  Filled 2022-02-20: qty 2

## 2022-02-20 MED ORDER — PHENOL 1.4 % MT LIQD: 1.0000 | OROMUCOSAL | Status: DC | PRN

## 2022-02-20 MED ORDER — THROMBIN 20000 UNITS EX SOLR
CUTANEOUS | Status: AC
Start: 2022-02-20 — End: ?
  Filled 2022-02-20: qty 20000

## 2022-02-20 MED ORDER — SUGAMMADEX SODIUM 200 MG/2ML IV SOLN
INTRAVENOUS | Status: DC | PRN
  Administered 2022-02-20: 200 mg via INTRAVENOUS

## 2022-02-20 MED ORDER — BUPIVACAINE HCL (PF) 0.25 % IJ SOLN
INTRAMUSCULAR | Status: DC | PRN
  Administered 2022-02-20: 20 mL

## 2022-02-20 MED ORDER — MIDAZOLAM HCL 2 MG/2ML IJ SOLN
INTRAMUSCULAR | Status: AC
  Filled 2022-02-20: qty 2

## 2022-02-20 MED ORDER — ONDANSETRON HCL 4 MG PO TABS: 4.0000 mg | ORAL_TABLET | Freq: Four times a day (QID) | ORAL | Status: DC | PRN

## 2022-02-20 MED ORDER — KETOROLAC TROMETHAMINE 30 MG/ML IJ SOLN
INTRAMUSCULAR | Status: AC
  Filled 2022-02-20: qty 1

## 2022-02-20 MED ORDER — DEXMEDETOMIDINE HCL IN NACL 80 MCG/20ML IV SOLN
INTRAVENOUS | Status: AC
  Filled 2022-02-20: qty 20

## 2022-02-20 MED ORDER — ROCURONIUM BROMIDE 10 MG/ML (PF) SYRINGE
PREFILLED_SYRINGE | INTRAVENOUS | Status: DC | PRN
  Administered 2022-02-20: 20 mg via INTRAVENOUS
  Administered 2022-02-20: 60 mg via INTRAVENOUS
  Administered 2022-02-20: 20 mg via INTRAVENOUS

## 2022-02-20 MED ORDER — FLEET ENEMA 7-19 GM/118ML RE ENEM
1.0000 | ENEMA | Freq: Once | RECTAL | Status: DC | PRN
Start: 2022-02-20 — End: 2022-02-21

## 2022-02-20 MED ORDER — SODIUM CHLORIDE 0.9% FLUSH: 3.0000 mL | INTRAVENOUS | Status: DC | PRN

## 2022-02-20 MED ORDER — SODIUM CHLORIDE 0.9 % IV SOLN
250.0000 mL | INTRAVENOUS | Status: DC
  Administered 2022-02-20: 250 mL via INTRAVENOUS

## 2022-02-20 MED ORDER — ONDANSETRON HCL 4 MG/2ML IJ SOLN
4.0000 mg | Freq: Four times a day (QID) | INTRAMUSCULAR | Status: DC | PRN
Start: 2022-02-20 — End: 2022-02-21

## 2022-02-20 MED ORDER — CELECOXIB 200 MG PO CAPS: 200.0000 mg | ORAL_CAPSULE | Freq: Two times a day (BID) | ORAL | Status: DC | PRN

## 2022-02-20 MED ORDER — ROCURONIUM BROMIDE 10 MG/ML (PF) SYRINGE
PREFILLED_SYRINGE | INTRAVENOUS | Status: AC
Start: 2022-02-20 — End: ?
  Filled 2022-02-20: qty 10

## 2022-02-20 MED ORDER — ONDANSETRON HCL 4 MG/2ML IJ SOLN
INTRAMUSCULAR | Status: AC
Start: 2022-02-20 — End: ?
  Filled 2022-02-20: qty 2

## 2022-02-20 MED ORDER — HYDROMORPHONE HCL 1 MG/ML IJ SOLN
0.2500 mg | INTRAMUSCULAR | Status: DC | PRN
  Administered 2022-02-20 (×2): 0.5 mg via INTRAVENOUS
  Administered 2022-02-20 (×2): 0.25 mg via INTRAVENOUS
  Administered 2022-02-20: 0.5 mg via INTRAVENOUS

## 2022-02-20 MED ORDER — ACETAMINOPHEN 10 MG/ML IV SOLN
1000.0000 mg | Freq: Once | INTRAVENOUS | Status: AC
Start: 2022-02-20 — End: 2022-02-20
  Administered 2022-02-20: 1000 mg via INTRAVENOUS

## 2022-02-20 MED ORDER — THROMBIN 20000 UNITS EX SOLR
CUTANEOUS | Status: DC | PRN
  Administered 2022-02-20: 20 mL via TOPICAL

## 2022-02-20 MED ORDER — POLYETHYLENE GLYCOL 3350 17 G PO PACK
17.0000 g | PACK | Freq: Every day | ORAL | Status: DC | PRN
Start: 2022-02-20 — End: 2022-02-21

## 2022-02-20 MED ORDER — PROPOFOL 10 MG/ML IV BOLUS
INTRAVENOUS | Status: AC
  Filled 2022-02-20: qty 20

## 2022-02-20 MED ORDER — EPHEDRINE 5 MG/ML INJ
INTRAVENOUS | Status: AC
  Filled 2022-02-20: qty 5

## 2022-02-20 MED ORDER — BUPIVACAINE HCL (PF) 0.25 % IJ SOLN
INTRAMUSCULAR | Status: AC
Start: 2022-02-20 — End: ?
  Filled 2022-02-20: qty 30

## 2022-02-20 MED ORDER — CHLORHEXIDINE GLUCONATE 0.12 % MT SOLN
15.0000 mL | Freq: Once | OROMUCOSAL | Status: AC
  Administered 2022-02-20: 15 mL via OROMUCOSAL
  Filled 2022-02-20: qty 15

## 2022-02-20 MED ORDER — HYDROMORPHONE HCL 1 MG/ML IJ SOLN: 1.0000 mg | INTRAMUSCULAR | Status: DC | PRN

## 2022-02-20 MED ORDER — PROMETHAZINE HCL 25 MG/ML IJ SOLN
6.2500 mg | INTRAMUSCULAR | Status: DC | PRN
  Administered 2022-02-20: 6.25 mg via INTRAVENOUS

## 2022-02-20 MED ORDER — HYDROMORPHONE HCL 1 MG/ML IJ SOLN
INTRAMUSCULAR | Status: AC
  Filled 2022-02-20: qty 1

## 2022-02-20 MED ORDER — VANCOMYCIN HCL 1000 MG IV SOLR
INTRAVENOUS | Status: DC | PRN
  Administered 2022-02-20: 1000 mg

## 2022-02-20 MED ORDER — CEFAZOLIN SODIUM-DEXTROSE 2-4 GM/100ML-% IV SOLN
2.0000 g | INTRAVENOUS | Status: AC
  Administered 2022-02-20: 2 g via INTRAVENOUS
  Filled 2022-02-20: qty 100

## 2022-02-20 MED ORDER — ONDANSETRON HCL 4 MG/2ML IJ SOLN
INTRAMUSCULAR | Status: DC | PRN
  Administered 2022-02-20: 4 mg via INTRAVENOUS

## 2022-02-20 MED ORDER — PROPOFOL 10 MG/ML IV BOLUS
INTRAVENOUS | Status: DC | PRN
  Administered 2022-02-20: 150 mg via INTRAVENOUS

## 2022-02-20 MED ORDER — ACETAMINOPHEN 325 MG PO TABS
650.0000 mg | ORAL_TABLET | ORAL | Status: DC | PRN
  Administered 2022-02-20 – 2022-02-21 (×3): 650 mg via ORAL
  Filled 2022-02-20 (×3): qty 2

## 2022-02-20 MED ORDER — OXYCODONE HCL 5 MG PO TABS
10.0000 mg | ORAL_TABLET | ORAL | Status: DC | PRN
  Administered 2022-02-20 – 2022-02-21 (×6): 10 mg via ORAL
  Filled 2022-02-20 (×6): qty 2

## 2022-02-20 MED ORDER — VANCOMYCIN HCL 1000 MG IV SOLR
INTRAVENOUS | Status: AC
Start: 2022-02-20 — End: ?
  Filled 2022-02-20: qty 20

## 2022-02-20 MED ORDER — FENTANYL CITRATE (PF) 250 MCG/5ML IJ SOLN
INTRAMUSCULAR | Status: DC | PRN
  Administered 2022-02-20: 50 ug via INTRAVENOUS
  Administered 2022-02-20: 25 ug via INTRAVENOUS
  Administered 2022-02-20: 50 ug via INTRAVENOUS
  Administered 2022-02-20: 25 ug via INTRAVENOUS
  Administered 2022-02-20: 100 ug via INTRAVENOUS

## 2022-02-20 MED ORDER — DEXMEDETOMIDINE HCL IN NACL 200 MCG/50ML IV SOLN
INTRAVENOUS | Status: DC | PRN
  Administered 2022-02-20: 8 ug via INTRAVENOUS
  Administered 2022-02-20 (×3): 4 ug via INTRAVENOUS
  Administered 2022-02-20: 8 ug via INTRAVENOUS

## 2022-02-20 MED ORDER — ACETAMINOPHEN 650 MG RE SUPP: 650.0000 mg | RECTAL | Status: DC | PRN

## 2022-02-20 MED ORDER — HYDROCODONE-ACETAMINOPHEN 10-325 MG PO TABS: 1.0000 | ORAL_TABLET | ORAL | Status: DC | PRN

## 2022-02-20 MED ORDER — MENTHOL 3 MG MT LOZG: 1.0000 | LOZENGE | OROMUCOSAL | Status: DC | PRN

## 2022-02-20 MED ORDER — PHENYLEPHRINE 80 MCG/ML (10ML) SYRINGE FOR IV PUSH (FOR BLOOD PRESSURE SUPPORT)
PREFILLED_SYRINGE | INTRAVENOUS | Status: AC
  Filled 2022-02-20: qty 10

## 2022-02-20 SURGICAL SUPPLY — 66 items
BAG COUNTER SPONGE SURGICOUNT (BAG) ×1 IMPLANT
BAG DECANTER FOR FLEXI CONT (MISCELLANEOUS) ×1 IMPLANT
BENZOIN TINCTURE PRP APPL 2/3 (GAUZE/BANDAGES/DRESSINGS) ×1 IMPLANT
BLADE BONE MILL MEDIUM (MISCELLANEOUS) ×1 IMPLANT
BLADE CLIPPER SURG (BLADE) IMPLANT
BUR CUTTER 7.0 ROUND (BURR) IMPLANT
BUR MATCHSTICK NEURO 3.0 LAGG (BURR) ×1 IMPLANT
CAGE EXP CATALYFT 9 (Plate) IMPLANT
CANISTER SUCT 3000ML PPV (MISCELLANEOUS) ×1 IMPLANT
CAP LCK SPNE (Orthopedic Implant) ×4 IMPLANT
CAP LOCK SPINE RADIUS (Orthopedic Implant) IMPLANT
CAP LOCKING (Orthopedic Implant) ×4 IMPLANT
CARTRIDGE OIL MAESTRO DRILL (MISCELLANEOUS) ×1 IMPLANT
CLSR STERI-STRIP ANTIMIC 1/2X4 (GAUZE/BANDAGES/DRESSINGS) IMPLANT
CNTNR URN SCR LID CUP LEK RST (MISCELLANEOUS) ×1 IMPLANT
CONT SPEC 4OZ STRL OR WHT (MISCELLANEOUS) ×1
COVER BACK TABLE 60X90IN (DRAPES) ×1 IMPLANT
DERMABOND IMPLANT
DERMABOND ADVANCED (GAUZE/BANDAGES/DRESSINGS) ×1
DERMABOND ADVANCED .7 DNX12 (GAUZE/BANDAGES/DRESSINGS) ×1 IMPLANT
DIFFUSER DRILL AIR PNEUMATIC (MISCELLANEOUS) ×1 IMPLANT
DRAPE C-ARM 42X72 X-RAY (DRAPES) ×2 IMPLANT
DRAPE HALF SHEET 40X57 (DRAPES) IMPLANT
DRAPE LAPAROTOMY 100X72X124 (DRAPES) ×1 IMPLANT
DRAPE SURG 17X23 STRL (DRAPES) ×4 IMPLANT
DRSG OPSITE POSTOP 4X6 (GAUZE/BANDAGES/DRESSINGS) ×1 IMPLANT
DURAPREP 26ML APPLICATOR (WOUND CARE) ×1 IMPLANT
ELECT REM PT RETURN 9FT ADLT (ELECTROSURGICAL) ×1
ELECTRODE REM PT RTRN 9FT ADLT (ELECTROSURGICAL) ×1 IMPLANT
EVACUATOR 1/8 PVC DRAIN (DRAIN) IMPLANT
GAUZE 4X4 16PLY ~~LOC~~+RFID DBL (SPONGE) IMPLANT
GAUZE SPONGE 4X4 12PLY STRL (GAUZE/BANDAGES/DRESSINGS) IMPLANT
GLOVE BIO SURGEON STRL SZ 6.5 (GLOVE) ×1 IMPLANT
GLOVE BIOGEL PI IND STRL 6.5 (GLOVE) ×1 IMPLANT
GLOVE BIOGEL PI IND STRL 7.0 (GLOVE) IMPLANT
GLOVE BIOGEL PI INDICATOR 6.5 (GLOVE) ×1
GLOVE BIOGEL PI INDICATOR 7.0 (GLOVE) ×2
GLOVE ECLIPSE 9.0 STRL (GLOVE) ×2 IMPLANT
GLOVE EXAM NITRILE XL STR (GLOVE) IMPLANT
GOWN STRL REUS W/ TWL LRG LVL3 (GOWN DISPOSABLE) IMPLANT
GOWN STRL REUS W/ TWL XL LVL3 (GOWN DISPOSABLE) ×2 IMPLANT
GOWN STRL REUS W/TWL 2XL LVL3 (GOWN DISPOSABLE) IMPLANT
GOWN STRL REUS W/TWL LRG LVL3 (GOWN DISPOSABLE) ×3
GOWN STRL REUS W/TWL XL LVL3 (GOWN DISPOSABLE) ×2
KIT BASIN OR (CUSTOM PROCEDURE TRAY) ×1 IMPLANT
KIT TURNOVER KIT B (KITS) ×1 IMPLANT
MILL MEDIUM DISP (BLADE) ×1 IMPLANT
NEEDLE HYPO 22GX1.5 SAFETY (NEEDLE) ×1 IMPLANT
NS IRRIG 1000ML POUR BTL (IV SOLUTION) ×1 IMPLANT
OIL CARTRIDGE MAESTRO DRILL (MISCELLANEOUS) ×1
PACK LAMINECTOMY NEURO (CUSTOM PROCEDURE TRAY) ×1 IMPLANT
PUTTY GRAFTON DBF 6CC W/DELIVE (Putty) IMPLANT
ROD RADIUS 35MM (Rod) IMPLANT
SCREW 5.75X40M (Screw) IMPLANT
SCREW 5.75X45MM (Screw) IMPLANT
SPIKE FLUID TRANSFER (MISCELLANEOUS) ×1 IMPLANT
SPONGE SURGIFOAM ABS GEL 100 (HEMOSTASIS) ×1 IMPLANT
STRIP CLOSURE SKIN 1/2X4 (GAUZE/BANDAGES/DRESSINGS) ×2 IMPLANT
SUT VIC AB 0 CT1 18XCR BRD8 (SUTURE) ×2 IMPLANT
SUT VIC AB 0 CT1 8-18 (SUTURE) ×1
SUT VIC AB 2-0 CT1 18 (SUTURE) ×1 IMPLANT
SUT VIC AB 3-0 SH 8-18 (SUTURE) ×2 IMPLANT
TOWEL GREEN STERILE (TOWEL DISPOSABLE) ×1 IMPLANT
TOWEL GREEN STERILE FF (TOWEL DISPOSABLE) ×1 IMPLANT
TRAY FOLEY MTR SLVR 16FR STAT (SET/KITS/TRAYS/PACK) ×1 IMPLANT
WATER STERILE IRR 1000ML POUR (IV SOLUTION) ×1 IMPLANT

## 2022-02-20 NOTE — Brief Op Note (Signed)
02/20/2022  10:30 AM  PATIENT:  Carolyn Wood  61 y.o. female  PRE-OPERATIVE DIAGNOSIS:  synovial cyst  POST-OPERATIVE DIAGNOSIS:  synovial cyst  PROCEDURE:  Procedure(s): Laminectomy for facet/synovial cyst - bilateral - Lumbar four-Lumbar five with Posterior  Lumbar Interbody Fusion WITH PEDICLE SCREWS (Bilateral)  SURGEON:  Surgeon(s) and Role:    Julio Sicks, MD - Primary  PHYSICIAN ASSISTANT:   ASSISTANTSMarland Mcalpine   ANESTHESIA:   general  EBL:  275 mL   BLOOD ADMINISTERED:none  DRAINS: none   LOCAL MEDICATIONS USED:  MARCAINE     SPECIMEN:  No Specimen  DISPOSITION OF SPECIMEN:  N/A  COUNTS:  YES  TOURNIQUET:  * No tourniquets in log *  DICTATION: .Dragon Dictation  PLAN OF CARE: Admit to inpatient   PATIENT DISPOSITION:  PACU - hemodynamically stable.   Delay start of Pharmacological VTE agent (>24hrs) due to surgical blood loss or risk of bleeding: yes

## 2022-02-20 NOTE — Transfer of Care (Signed)
Immediate Anesthesia Transfer of Care Note  [name]  Procedure(s) Performed: Laminectomy for facet/synovial cyst - bilateral - Lumbar four-Lumbar five with Posterior  Lumbar Interbody Fusion WITH PEDICLE SCREWS (Bilateral: Back)  Patient Location: PACU  Anesthesia Type:General  Level of Consciousness: awake, alert  and oriented  Airway & Oxygen Therapy: Patient Spontanous Breathing  Post-op Assessment: Report given to RN and Post -op Vital signs reviewed and stable  Post vital signs: Reviewed and stable  Last Vitals:  Vitals Value Taken Time  BP 125/73 02/20/22 1041  Temp    Pulse 80 02/20/22 1043  Resp 14 02/20/22 1043  SpO2 94 % 02/20/22 1043  Vitals shown include unvalidated device data.  Last Pain:  Vitals:   02/20/22 0638  TempSrc:   PainSc: 5       Patients Stated Pain Goal: 3 (02/20/22 8333)  Complications: No notable events documented.

## 2022-02-20 NOTE — Progress Notes (Signed)
Orthopedic Tech Progress Note Patient Details:  Carolyn Wood 1960/09/20 [id number]  Ortho Devices Type of Ortho Device: Lumbar corsett Ortho Device/Splint Location: BACK Ortho Device/Splint Interventions: Ordered   Post Interventions Patient Tolerated: Well Instructions Provided: Care of device  Donald Pore 02/20/2022, 1:58 PM

## 2022-02-20 NOTE — Anesthesia Procedure Notes (Signed)
Procedure Name: Intubation Date/Time: 02/20/2022 8:16 AM  Performed by: Pearson Grippe, CRNAPre-anesthesia Checklist: Patient identified, Emergency Drugs available, Suction available and Patient being monitored Patient Re-evaluated:Patient Re-evaluated prior to induction Oxygen Delivery Method: Circle system utilized Preoxygenation: Pre-oxygenation with 100% oxygen Induction Type: IV induction Ventilation: Mask ventilation without difficulty Laryngoscope Size: Miller and 2 Grade View: Grade I Tube type: Oral Tube size: 7.0 mm Number of attempts: 1 Airway Equipment and Method: Stylet and Oral airway Placement Confirmation: ETT inserted through vocal cords under direct vision, positive ETCO2 and breath sounds checked- equal and bilateral Secured at: 21 cm Tube secured with: Tape Dental Injury: Teeth and Oropharynx as per pre-operative assessment

## 2022-02-20 NOTE — Op Note (Signed)
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  L4-5 posterior lumbar interbody fusion utilizing interbody cages, local harvested autograft, and morselized allograft  L4-5 posterior lateral arthrodesis utilizing nonsegmental pedicle screw fixation and local autograft  Surgeon:Lochlin Eppinger A.Braydan Marriott, M.D.  Asst. Surgeon: Doran Durand, NP  Anesthesia: General  Indication: 61 year old female with severe back and bilateral lower extremity pain right greater than left failing conservative management work-up demonstrates evidence for mobile degenerative spondylolisthesis with associated synovial cyst causing severe stenosis at L4-5.  Patient presents now for L4-5 decompression and fusion surgery.  Operative note: After induction of anesthesia, patient position prone on the Wilson frame and appropriately padded.  Lumbar region prepped and draped sterilely.  Incision made overlying L4-5.  Dissection performed bilaterally.  Retractor placed.  Fluoroscopy used.  Levels confirmed.  Decompressive laminotomies and facetectomies then performed bilaterally using Leksell rongeurs, Kerrison rongeurs and high-speed drill to remove the inferior two thirds of the lamina of L4, the entire inferior facet of L4 bilaterally and the superior aspect of the superior tubular process of L5 and the superior aspect of the L5 lamina.  Ligament flavum elevated and resected.  Foraminotomies completed on the course the exiting L4 and L5 nerve roots on the left side.  On the right side there was a large adherent  synovial cyst.  This was dissected free circumferentially.  Microscope was used for microdissection.  The synovial cyst was carefully resected from the thecal sac and left L5 nerve root.  Complete resection was achieved.  There was no evidence of injury to the thecal sac and nerve roots.  Wound was then irrigated.  Bilateral discectomy was then performed using various instruments.  The space then prepared for interbody fusion.  With distractor placed patient's right side.  To space further cleaned on the left side.  A 9 mm [employer] expandable cage was then impacted in the place and expanded.  Distractor was removed patient's right side.  The space was prepared on the right side.  Morselized autograft was packed into the interspace.  Second cage was impacted in the place and expanded.  Pedicles of L4 and L5 were identified using surface landmarks and intraoperative fluoroscopy and superficial bone overlying the pedicle was then removed and high-speed drill.  Pedicle was then probed using a pedicle all each pedicle all track was then probed and found to be solidly within the bone.  5.75 x 45 mm radius brand screws from [employer] placed bilaterally at L4.  5.75 x 40 mm screws placed bilaterally at L5.  Final images reveal good position of the cages and the hardware at the proper upper level with normal alignment of spine.  Each cage was further packed with demineralized bone fibrils.  Gelfoam was placed over the laminotomy defect.  Transverse processes were decorticated.  Morselized autograft was packed posterior laterally.  Short segment titanium rod placed over the screw heads at L4 and L5.  Locking caps then placed over the screws with locking caps then engaged with a construct or compression.  Vancomycin powder was placed in deep wound space.  Wounds and closed in layers with Vicryl sutures.  Steri-Strips and sterile dressing were applied.  No apparent complications.  Patient tolerated the procedure well and  she returns to recovery room postop.

## 2022-02-20 NOTE — H&P (Signed)
Carolyn Wood is an 61 y.o. female.   Chief Complaint: Back pain HPI: 61 year old female with severe back and bilateral lower extremity symptoms right greater than left failing conservative management.  Work-up demonstrates evidence of an unstable degenerative spondylolisthesis with associated synovial cyst causing significant thecal sac and in particular right-sided L5 nerve root compression.  Patient has failed conservative management presents now for decompression and fusion in hopes of improving her symptoms.  Past Medical History:  Diagnosis Date   Anxiety    Arthritis    Complication of anesthesia    combative when waking up   Elevated liver enzymes    History of Papanicolaou smear of cervix 11/05/2013   RNIL/NEG   HTN (hypertension)    PONV (postoperative nausea and vomiting)    Seasonal allergies    Synovial cyst of lumbar spine     Past Surgical History:  Procedure Laterality Date   ABDOMINAL HYSTERECTOMY     ABDOMINOPLASTY  2015   AUGMENTATION MAMMAPLASTY Bilateral 2015   silicone   BUNIONECTOMY Bilateral    CESAREAN SECTION     chin implant     cyst removed     tail bone   JOINT REPLACEMENT Right    hip   KNEE ARTHROSCOPY Right 1989   "dont know what they did"   LIPOSUCTION     on neck and chin   NASAL SEPTUM SURGERY Right 07/2016   NECK SURGERY  05/26/2019   TONSILLECTOMY     TOTAL HIP ARTHROPLASTY Right 10/29/2012   Procedure: RIGHT TOTAL HIP ARTHROPLASTY ANTERIOR APPROACH;  Surgeon: Shelda Pal, MD;  Location: [facility];  Service: Orthopedics;  Laterality: Right;   TOTAL HIP ARTHROPLASTY Left 06/04/2018   Procedure: LEFT TOTAL HIP ARTHROPLASTY ANTERIOR APPROACH;  Surgeon: Durene Romans, MD;  Location: [facility];  Service: Orthopedics;  Laterality: Left;  70 mins    Family History  Problem Relation Age of Onset   Heart failure Mother    Cancer Mother        kidney   Diabetes Mother    Hypertension Mother    Drug abuse Brother    Bipolar disorder  Maternal Aunt    Lung cancer Maternal Grandmother    Breast cancer Neg Hx    Ovarian cancer Neg Hx    Social History:  reports that she has quit smoking. Her smoking use included cigarettes and cigars. She has a 10.00 pack-year smoking history. She has never used smokeless tobacco. She reports that she does not currently use alcohol. She reports that she does not use drugs.  Allergies: No Known Allergies  Medications Prior to Admission  Medication Sig Dispense Refill   celecoxib (CELEBREX) 200 MG capsule Take 200 mg by mouth 2 (two) times daily as needed for moderate pain.     HYDROcodone-acetaminophen (NORCO/VICODIN) 5-325 MG tablet Take 1 tablet by mouth 3 (three) times daily as needed for moderate pain.     losartan (COZAAR) 100 MG tablet Take 1 tablet (100 mg total) by mouth daily. 90 tablet 1   hydrOXYzine (ATARAX/VISTARIL) 10 MG tablet Take 1 tablet (10 mg total) by mouth every 8 (eight) hours as needed. 30 tablet 0   sertraline (ZOLOFT) 100 MG tablet Take 1 tablet (100 mg total) by mouth daily. (Patient not taking: Reported on 02/10/2022) 30 tablet 1   valACYclovir (VALTREX) 500 MG tablet Take 1 tablet (500 mg total) by mouth daily. (Patient not taking: Reported on 02/10/2022) 90 tablet 3    No results  found for this or any previous visit (from the past 48 hour(s)). No results found.  Pertinent items noted in HPI and remainder of comprehensive ROS otherwise negative.  Blood pressure 139/81, pulse 67, temperature 98 F (36.7 C), temperature source Oral, resp. rate 17, height 5' 7.5" (1.715 m), weight 76.7 kg, SpO2 97 %.  Patient is awake and alert.  She is oriented and appropriate.  Speech is fluent.  Judgment insight are intact.  Cranial nerve function normal bilateral.  Motor examination reveals mild weakness of dorsiflexion on the right side otherwise motor strength intact.  Sensory examination with decrease sensation to very light touch in her right L5 dermatome.  Deep Temrex  normal active.  No evidence of long track signs.  Gait antalgic.  Posture mildly flexed peer examination head ears eyes nose and throat is unremarked.  Chest and abdomen are benign.  Extremities are free from injury or deformity. Assessment/Plan Unstable L4-5 degenerative spondylolisthesis with associated synovial cyst and intractable radiculopathy.  Plan bilateral L4-5 decompressive laminotomies with resection of synovial cyst followed by posterior lumbar to my fusion utilizing interbody cages, local autograft, augmented with posterior lateral arthrodesis utilizing nonsegmental pedicle screw fixation and local autografting.  Risks and benefits been explained.  Patient wishes to proceed.Marland Kitchen  Carolyn Wood Carolyn Wood 02/20/2022, 7:52 AM

## 2022-02-20 NOTE — Anesthesia Postprocedure Evaluation (Signed)
Anesthesia Post Note  Patient: Eldred Lievanos  Procedure(s) Performed: Laminectomy for facet/synovial cyst - bilateral - Lumbar four-Lumbar five with Posterior  Lumbar Interbody Fusion WITH PEDICLE SCREWS (Bilateral: Back)     Patient location during evaluation: PACU Anesthesia Type: General Level of consciousness: awake and sedated Pain management: pain level not controlled Vital Signs Assessment: post-procedure vital signs reviewed and stable Respiratory status: spontaneous breathing Cardiovascular status: stable Postop Assessment: no apparent nausea or vomiting Anesthetic complications: no   No notable events documented.  Last Vitals:  Vitals:   02/20/22 1040 02/20/22 1055  BP: 125/73 118/75  Pulse: 82 78  Resp: 18 11  Temp: 36.6 C   SpO2: 93% 92%    Last Pain:  Vitals:   02/20/22 1059  TempSrc:   PainSc: 7                  John F [address]

## 2022-02-21 DIAGNOSIS — M4316 Spondylolisthesis, lumbar region: Secondary | ICD-10-CM | POA: Diagnosis not present

## 2022-02-21 MED ORDER — DIPHENHYDRAMINE HCL 25 MG PO CAPS: 25.0000 mg | ORAL_CAPSULE | ORAL | Status: DC | PRN

## 2022-02-21 MED ORDER — METHOCARBAMOL 500 MG PO TABS: 500.0000 mg | ORAL_TABLET | Freq: Four times a day (QID) | ORAL | 0 refills | Status: AC

## 2022-02-21 MED ORDER — HYDROCODONE-ACETAMINOPHEN 10-325 MG PO TABS: 1.0000 | ORAL_TABLET | ORAL | 0 refills | Status: AC | PRN

## 2022-02-21 NOTE — Progress Notes (Signed)
Patient alert and oriented, mae's well, voiding adequate amount of urine, swallowing without difficulty, no c/o pain at time of discharge. Patient discharged home with family. Script and discharged instructions given to patient. Patient and family stated understanding of instructions given. Patient has an appointment with Dr. Pool in 2 weeks 

## 2022-02-21 NOTE — Discharge Summary (Signed)
Physician Discharge Summary  Patient ID: Carolyn Wood MRN: [id number] DOB/AGE: 1961/06/24 61 y.o.  Admit date: 02/20/2022 Discharge date: 02/21/2022  Admission Diagnoses:  Discharge Diagnoses:  Principal Problem:   Synovial cyst of lumbar facet joint   Discharged Condition: good  Hospital Course: Patient admitted to the hospital where she went uncomplicated L4-5 decompression and fusion surgery.  Postoperatively doing well.  Back pain well controlled.  No lower extremity pain.  Standing ambulating and voiding without difficulty.  Ready for discharge home.  Consults:   Significant Diagnostic Studies:   Treatments:   Discharge Exam: Blood pressure 116/73, pulse 79, temperature 98.2 F (36.8 C), temperature source Oral, resp. rate 16, height 5' 7.5" (1.715 m), weight 76.7 kg, SpO2 96 %. Awake and alert.  Oriented and appropriate.  Motor and sensory function intact.  Wound clean and dry.  Chest and abdomen benign.  Disposition:    Allergies as of 02/21/2022   No Known Allergies      Medication List     STOP taking these medications    HYDROcodone-acetaminophen 5-325 MG tablet Commonly known as: NORCO/VICODIN Replaced by: HYDROcodone-acetaminophen 10-325 MG tablet       TAKE these medications    celecoxib 200 MG capsule Commonly known as: CELEBREX Take 200 mg by mouth 2 (two) times daily as needed for moderate pain.   HYDROcodone-acetaminophen 10-325 MG tablet Commonly known as: NORCO Take 1 tablet by mouth every 4 (four) hours as needed for moderate pain ((score 4 to 6)). Replaces: HYDROcodone-acetaminophen 5-325 MG tablet   hydrOXYzine 10 MG tablet Commonly known as: ATARAX Take 1 tablet (10 mg total) by mouth every 8 (eight) hours as needed.   losartan 100 MG tablet Commonly known as: COZAAR Take 1 tablet (100 mg total) by mouth daily.   methocarbamol 500 MG tablet Commonly known as: ROBAXIN Take 1 tablet (500 mg total) by mouth 4 (four) times  daily.               Durable Medical Equipment  (From admission, onward)           Start     Ordered   02/20/22 1303  DME Walker rolling  Once       Question:  Patient needs a walker to treat with the following condition  Answer:  Synovial cyst of lumbar facet joint   02/20/22 1302   02/20/22 1303  DME 3 n 1  Once        02/20/22 1302             Signed: Kathaleen Maser Rola Lennon 02/21/2022, 8:06 AM

## 2022-02-21 NOTE — Evaluation (Signed)
Occupational Therapy Evaluation Patient Details Name: Carolyn Wood MRN: [id number] DOB: 1960/08/06 Today's Date: 02/21/2022   History of Present Illness 61 y/o female who presents s/p L4-L5 PLIF on 02/20/2022. PMH signficant for anxiety, HTN, R and L hip replacement, neck surgery.   Clinical Impression   PTA, pt was living alone and was independent. Currently, pt performing at Mod I level for ADLs and functional mobility. Provided education and handout on back precautions, brace management, grooming, LB ADLs, toilet transfer, car transfer, and shower transfer; pt demonstrated understanding. Answered all pt questions. Recommend dc home once medically stable per physician. All acute OT needs met and will sign off. Thank you.    Recommendations for follow up therapy are one component of a multi-disciplinary discharge planning process, led by the attending physician.  Recommendations may be updated based on patient status, additional functional criteria and insurance authorization.   Follow Up Recommendations  No OT follow up    Assistance Recommended at Discharge PRN  Patient can return home with the following      Functional Status Assessment  Patient has had a recent decline in their functional status and demonstrates the ability to make significant improvements in function in a reasonable and predictable amount of time.  Equipment Recommendations  None recommended by OT    Recommendations for Other Services       Precautions / Restrictions Precautions Precautions: Fall;Back Precaution Booklet Issued: Yes (comment) Precaution Comments: Reviewed handout and pt was cued for precautions during functional mobility. Required Braces or Orthoses: Spinal Brace Spinal Brace: Lumbar corset;Applied in sitting position Restrictions Weight Bearing Restrictions: No      Mobility Bed Mobility Overal bed mobility: Modified Independent Bed Mobility: Rolling, Sidelying to Sit            General bed mobility comments: HOB flat and rails lowered to simulate home environment.    Transfers Overall transfer level: Modified independent Equipment used: None               General transfer comment: Pt demonstrated proper hand placement for transfers. VC's for wide BOS to minimize bending during sit<>stand.      Balance Overall balance assessment: Mild deficits observed, not formally tested                                         ADL either performed or assessed with clinical judgement   ADL Overall ADL's : Modified independent                                       General ADL Comments: Pt performing at Mod I level with increased time and adaptive techniques. Providing education on back precautions, brace management, bed mobility, LB ADLs (with figure four and sock aide), toileting, grooming, and shower transfer. Pt demonstrating udnerstanding.     Vision         Perception     Praxis      Pertinent Vitals/Pain Pain Assessment Pain Assessment: Faces Faces Pain Scale: Hurts a little bit Pain Location: incision site Pain Descriptors / Indicators: Operative site guarding Pain Intervention(s): Monitored during session, Limited activity within patient's tolerance, Repositioned     Hand Dominance Right   Extremity/Trunk Assessment Upper Extremity Assessment Upper Extremity Assessment: Overall WFL for tasks assessed   Lower Extremity  Assessment Lower Extremity Assessment: Defer to PT evaluation RLE Deficits / Details: Decreased strength and muscular endurance consistent with pre-op diagnosis   Cervical / Trunk Assessment Cervical / Trunk Assessment: Back Surgery   Communication Communication Communication: No difficulties   Cognition Arousal/Alertness: Awake/alert Behavior During Therapy: WFL for tasks assessed/performed Overall Cognitive Status: Within Functional Limits for tasks assessed                                        General Comments       Exercises     Shoulder Instructions      Home Living Family/patient expects to be discharged to:: Private residence Living Arrangements: Alone Available Help at Discharge: Neighbor;Friend(s) Type of Home: House Home Access: Stairs to enter [employer] of Steps: 5-6 Entrance Stairs-Rails: Right;Left;Can reach both Home Layout: One level     Bathroom Shower/Tub: Occupational psychologist: Handicapped height     Home Equipment: [place] - single point;Shower seat - built in;Hand held shower head          Prior Functioning/Environment Prior Level of Function : Independent/Modified Independent             Mobility Comments: Pt independent and caring for several animals at the house including 40 chickens, guineas, cats, dogs          OT Problem List: Decreased activity tolerance;Decreased range of motion;Decreased knowledge of use of DME or AE;Decreased knowledge of precautions      OT Treatment/Interventions:      OT Goals(Current goals can be found in the care plan section) Acute Rehab OT Goals Patient Stated Goal: Go home OT Goal Formulation: All assessment and education complete, DC therapy  OT Frequency:      Co-evaluation              AM-PAC OT "6 Clicks" Daily Activity     Outcome Measure Help from another person eating meals?: None Help from another person taking care of personal grooming?: None Help from another person toileting, which includes using toliet, bedpan, or urinal?: None Help from another person bathing (including washing, rinsing, drying)?: None Help from another person to put on and taking off regular upper body clothing?: None Help from another person to put on and taking off regular lower body clothing?: None 6 Click Score: 24   End of Session Equipment Utilized During Treatment: Back brace Nurse Communication: Mobility status  Activity Tolerance: Patient  tolerated treatment well Patient left: in chair;with call bell/phone within reach  OT Visit Diagnosis: Muscle weakness (generalized) (M62.81);Pain                Time: 6767-2094 OT Time Calculation (min): 24 min Charges:  OT General Charges $OT Visit: 1 Visit OT Evaluation $OT Eval Low Complexity: 1 Low OT Treatments $Self Care/Home Management : 8-22 mins  Aunisty Reali MSOT, OTR/L Acute Rehab Office: [place] 02/21/2022, 10:19 AM

## 2022-02-21 NOTE — Discharge Instructions (Addendum)
Wound Care Keep incision covered and dry for three days.   Do not put any creams, lotions, or ointments on incision. Leave steri-strips on back.  They will fall off by themselves. Activity Walk each and every day, increasing distance each day. No lifting greater than 5 lbs.  Avoid excessive back bending. No driving for 2 weeks; may ride as a passenger locally. If provided with back brace, wear when out of bed.  It is not necessary to wear brace in bed. Diet Resume your normal diet.  Return to Work Will be discussed at you follow up appointment. Call Your Doctor If Any of These Occur Redness, drainage, or swelling at the wound.  Temperature greater than 101 degrees. Severe pain not relieved by pain medication. Incision starts to come apart. Follow Up Appt Call today for appointment in 1-2 weeks ([phone]) or for problems.  If you have any hardware placed in your spine, you will need an x-ray before your appointment.  

## 2022-02-21 NOTE — TOC Transition Note (Signed)
Transition of Care [facility]) - CM/SW Discharge Note   Patient Details  Name: Carolyn Wood MRN: [id number] Date of Birth: [birth year]  Transition of [facility]) CM/SW Contact:  Glennon Mac, RN Phone Number: 02/21/2022, 10:09 AM   Clinical Narrative:    Pt is a 61 y/o female who presents s/p L4-L5 PLIF on 02/20/2022. PMH signficant for anxiety, HTN, R and L hip replacement, neck surgery. PTA, pt independent and living at home; has friends and neighbors to assist with needed care. Recommended DME to be provided by [facility] Staff; PT recommending no OP follow up.   Final next level of care: Home/Self Care Barriers to Discharge: Barriers Resolved            Discharge Plan and Services   Discharge Planning Services: CM Consult            DME Arranged: 3-N-1, Walker rolling   Date DME Agency Contacted: 02/21/22   Representative spoke with at DME Agency: Provided by [facility] staff            Social Determinants of Health (SDOH) Interventions     Readmission Risk Interventions     No data to display         Quintella Baton, RN, BSN  Trauma/Neuro ICU Case Manager [phone]

## 2022-02-21 NOTE — Evaluation (Signed)
Physical Therapy Evaluation  Patient Details Name: Carolyn Wood MRN: [id number] DOB: 1961/05/28 Today's Date: 02/21/2022  History of Present Illness  Pt is a 61 y/o female who presents s/p L4-L5 PLIF on 02/20/2022. PMH signficant for anxiety, HTN, R and L hip replacement, neck surgery.   Clinical Impression  Pt admitted with above diagnosis. At the time of PT eval, pt was able to demonstrate transfers and ambulation with up to supervision for safety and no AD. Pt was educated on precautions, brace application/wearing schedule, appropriate activity progression, and car transfer. Pt currently with functional limitations due to the deficits listed below (see PT Problem List). Pt will benefit from skilled PT to increase their independence and safety with mobility to allow discharge to the venue listed below.         Recommendations for follow up therapy are one component of a multi-disciplinary discharge planning process, led by the attending physician.  Recommendations may be updated based on patient status, additional functional criteria and insurance authorization.  Follow Up Recommendations No PT follow up      Assistance Recommended at Discharge PRN  Patient can return home with the following  A little help with walking and/or transfers;Assistance with cooking/housework;Assist for transportation;Help with stairs or ramp for entrance    Equipment Recommendations None recommended by PT  Recommendations for Other Services       Functional Status Assessment Patient has had a recent decline in their functional status and demonstrates the ability to make significant improvements in function in a reasonable and predictable amount of time.     Precautions / Restrictions Precautions Precautions: Fall;Back Precaution Booklet Issued: Yes (comment) Precaution Comments: Reviewed handout and pt was cued for precautions during functional mobility. Required Braces or Orthoses: Spinal  Brace Spinal Brace: Lumbar corset;Applied in sitting position Restrictions Weight Bearing Restrictions: No      Mobility  Bed Mobility Overal bed mobility: Modified Independent Bed Mobility: Rolling, Sidelying to Sit           General bed mobility comments: HOB flat and rails lowered to simulate home environment.    Transfers Overall transfer level: Modified independent Equipment used: None               General transfer comment: Pt demonstrated proper hand placement for transfers. VC's for wide BOS to minimize bending during sit<>stand.    Ambulation/Gait Ambulation/Gait assistance: Supervision Gait Distance (Feet): 300 Feet Assistive device: None Gait Pattern/deviations: Step-through pattern, Decreased stride length, Trunk flexed Gait velocity: Decreased Gait velocity interpretation: <1.31 ft/sec, indicative of household ambulator   General Gait Details: VC's for improved posture. No gross unsteadiness or LOB noted.  Stairs Stairs: Yes Stairs assistance: Min guard Stair Management: One rail Right, Step to pattern, Forwards Number of Stairs: 5 General stair comments: VC's for sequencing and general safety. No assist required.  Wheelchair Mobility    Modified Rankin (Stroke Patients Only)       Balance Overall balance assessment: Mild deficits observed, not formally tested                                           Pertinent Vitals/Pain Pain Assessment Pain Assessment: Faces Faces Pain Scale: Hurts a little bit Pain Location: incision site Pain Descriptors / Indicators: Operative site guarding Pain Intervention(s): Limited activity within patient's tolerance, Monitored during session, Repositioned    Home Living Family/patient expects to  be discharged to:: Private residence Living Arrangements: Alone Available Help at Discharge: Neighbor;Friend(s) Type of Home: House Home Access: Stairs to enter Entrance Stairs-Rails:  Right;Left;Can reach both Entrance Stairs-Number of Steps: 5-6   Home Layout: One level Home Equipment: [place] - single point;Shower seat - built in;Hand held shower head      Prior Function Prior Level of Function : Independent/Modified Independent             Mobility Comments: Pt independent and caring for several animals at the house including 40 chickens, guineas, cats, dogs       Hand Dominance   Dominant Hand: Right    Extremity/Trunk Assessment   Upper Extremity Assessment Upper Extremity Assessment: Overall WFL for tasks assessed    Lower Extremity Assessment Lower Extremity Assessment: RLE deficits/detail RLE Deficits / Details: Decreased strength and muscular endurance consistent with pre-op diagnosis    Cervical / Trunk Assessment Cervical / Trunk Assessment: Back Surgery  Communication   Communication: No difficulties  Cognition Arousal/Alertness: Awake/alert Behavior During Therapy: WFL for tasks assessed/performed Overall Cognitive Status: Within Functional Limits for tasks assessed                                          General Comments      Exercises     Assessment/Plan    PT Assessment Patient needs continued PT services  PT Problem List Decreased strength;Decreased activity tolerance;Decreased balance;Decreased mobility;Decreased knowledge of use of DME;Decreased safety awareness;Decreased knowledge of precautions;Pain       PT Treatment Interventions DME instruction;Gait training;Stair training;Functional mobility training;Therapeutic activities;Therapeutic exercise;Balance training;Patient/family education    PT Goals (Current goals can be found in the Care Plan section)  Acute Rehab PT Goals Patient Stated Goal: Home today, be able to take care of her animals at home PT Goal Formulation: With patient Time For Goal Achievement: 02/28/22 Potential to Achieve Goals: Good    Frequency Min 5X/week      Co-evaluation               AM-PAC PT "6 Clicks" Mobility  Outcome Measure Help needed turning from your back to your side while in a flat bed without using bedrails?: None Help needed moving from lying on your back to sitting on the side of a flat bed without using bedrails?: None Help needed moving to and from a bed to a chair (including a wheelchair)?: A Little Help needed standing up from a chair using your arms (e.g., wheelchair or bedside chair)?: A Little Help needed to walk in hospital room?: A Little Help needed climbing 3-5 steps with a railing? : A Little 6 Click Score: 20    End of Session Equipment Utilized During Treatment: Gait belt;Back brace Activity Tolerance: Patient tolerated treatment well Patient left: in chair;with call bell/phone within reach Nurse Communication: Mobility status PT Visit Diagnosis: Unsteadiness on feet (R26.81);Pain Pain - part of body:  (back)    Time: 7741-2878 PT Time Calculation (min) (ACUTE ONLY): 25 min   Charges:   PT Evaluation $PT Eval Low Complexity: 1 Low PT Treatments $Gait Training: 8-22 mins        Conni Slipper, PT, DPT Acute Rehabilitation Services Secure Chat Preferred Office: [phone]   Marylynn Pearson 02/21/2022, 9:30 AM

## 2022-02-22 ENCOUNTER — Ambulatory Visit: Payer: BC Managed Care – PPO | Admitting: Nurse Practitioner

## 2022-03-08 MED FILL — Sodium Chloride IV Soln 0.9%: INTRAVENOUS | Qty: 2000 | Status: AC

## 2022-03-08 MED FILL — Heparin Sodium (Porcine) Inj 1000 Unit/ML: INTRAMUSCULAR | Qty: 30 | Status: AC

## 2022-03-18 NOTE — Progress Notes (Deleted)
BH MD/PA/NP OP Progress Note  03/18/2022 4:32 PM Carolyn Wood  MRN:  [id number]  Chief Complaint: No chief complaint on file.  HPI:  - she had Laminectomy for facet/synovial cyst - bilateral     Visit Diagnosis: No diagnosis found.  Past Psychiatric History: Please see initial evaluation for full details. I have reviewed the history. No updates at this time.     Past Medical History:  Past Medical History:  Diagnosis Date   Anxiety    Arthritis    Complication of anesthesia    combative when waking up   Elevated liver enzymes    History of Papanicolaou smear of cervix 11/05/2013   RNIL/NEG   HTN (hypertension)    PONV (postoperative nausea and vomiting)    Seasonal allergies    Synovial cyst of lumbar spine     Past Surgical History:  Procedure Laterality Date   ABDOMINAL HYSTERECTOMY     ABDOMINOPLASTY  2015   AUGMENTATION MAMMAPLASTY Bilateral 2015   silicone   BUNIONECTOMY Bilateral    CESAREAN SECTION     chin implant     cyst removed     tail bone   JOINT REPLACEMENT Right    hip   KNEE ARTHROSCOPY Right 1989   "dont know what they did"   LIPOSUCTION     on neck and chin   NASAL SEPTUM SURGERY Right 07/2016   NECK SURGERY  05/26/2019   TONSILLECTOMY     TOTAL HIP ARTHROPLASTY Right 10/29/2012   Procedure: RIGHT TOTAL HIP ARTHROPLASTY ANTERIOR APPROACH;  Surgeon: Shelda Pal, MD;  Location: [facility];  Service: Orthopedics;  Laterality: Right;   TOTAL HIP ARTHROPLASTY Left 06/04/2018   Procedure: LEFT TOTAL HIP ARTHROPLASTY ANTERIOR APPROACH;  Surgeon: Durene Romans, MD;  Location: [facility];  Service: Orthopedics;  Laterality: Left;  70 mins    Family Psychiatric History: Please see initial evaluation for full details. I have reviewed the history. No updates at this time.     Family History:  Family History  Problem Relation Age of Onset   Heart failure Mother    Cancer Mother        kidney   Diabetes Mother    Hypertension Mother    Drug  abuse Brother    Bipolar disorder Maternal Aunt    Lung cancer Maternal Grandmother    Breast cancer Neg Hx    Ovarian cancer Neg Hx     Social History:  Social History   Socioeconomic History   Marital status: Divorced    Spouse name: Not on file   Number of children: 2   Years of education: Not on file   Highest education level: Bachelor's degree (e.g., BA, AB, BS)  Occupational History   Not on file  Tobacco Use   Smoking status: Former    Packs/day: 0.50    Years: 20.00    Total pack years: 10.00    Types: Cigarettes, Cigars   Smokeless tobacco: Never  Vaping Use   Vaping Use: Never used  Substance and Sexual Activity   Alcohol use: Not Currently   Drug use: No   Sexual activity: Not Currently    Birth control/protection: Surgical    Comment: Hysterectomy  Other Topics Concern   Not on file  Social History Narrative   Not on file   Social Determinants of Health   Financial Resource Strain: Not on file  Food Insecurity: Not on file  Transportation Needs: Not on file  Physical  Activity: Not on file  Stress: Not on file  Social Connections: Not on file    Allergies: No Known Allergies  Metabolic Disorder Labs: Lab Results  Component Value Date   HGBA1C 5.2 04/17/2018   No results found for: "PROLACTIN" Lab Results  Component Value Date   CHOL 215 (H) 11/23/2021   TRIG 92 11/23/2021   HDL 67 11/23/2021   CHOLHDL 3.2 11/23/2021   LDLCALC 132 (H) 11/23/2021   LDLCALC 122 (H) 01/19/2021   Lab Results  Component Value Date   TSH 2.230 11/23/2021   TSH 2.000 01/19/2021    Therapeutic Level Labs: No results found for: "LITHIUM" No results found for: "VALPROATE" No results found for: "CBMZ"  Current Medications: Current Outpatient Medications  Medication Sig Dispense Refill   celecoxib (CELEBREX) 200 MG capsule Take 200 mg by mouth 2 (two) times daily as needed for moderate pain.     HYDROcodone-acetaminophen (NORCO) 10-325 MG tablet Take 1  tablet by mouth every 4 (four) hours as needed for moderate pain ((score 4 to 6)). 50 tablet 0   hydrOXYzine (ATARAX/VISTARIL) 10 MG tablet Take 1 tablet (10 mg total) by mouth every 8 (eight) hours as needed. 30 tablet 0   losartan (COZAAR) 100 MG tablet Take 1 tablet (100 mg total) by mouth daily. 90 tablet 1   methocarbamol (ROBAXIN) 500 MG tablet Take 1 tablet (500 mg total) by mouth 4 (four) times daily. 30 tablet 0   No current facility-administered medications for this visit.     Musculoskeletal: Strength & Muscle Tone: within normal limits Gait & Station: normal Patient leans: N/A  Psychiatric Specialty Exam: Review of Systems  There were no vitals taken for this visit.There is no height or weight on file to calculate BMI.  General Appearance: {Appearance:22683}  Eye Contact:  {BHH EYE CONTACT:22684}  Speech:  Clear and Coherent  Volume:  Normal  Mood:  {BHH MOOD:22306}  Affect:  {Affect (PAA):22687}  Thought Process:  Coherent  Orientation:  Full (Time, Place, and Person)  Thought Content: Logical   Suicidal Thoughts:  {ST/HT (PAA):22692}  Homicidal Thoughts:  {ST/HT (PAA):22692}  Memory:  Immediate;   Good  Judgement:  {Judgement (PAA):22694}  Insight:  {Insight (PAA):22695}  Psychomotor Activity:  Normal  Concentration:  Concentration: Good and Attention Span: Good  Recall:  Good  Fund of Knowledge: Good  Language: Good  Akathisia:  No  Handed:  Right  AIMS (if indicated): not done  Assets:  Communication Skills Desire for Improvement  ADL's:  Intact  Cognition: WNL  Sleep:  {BHH GOOD/FAIR/POOR:22877}   Screenings: GAD-7    Flowsheet Row Office Visit from 01/30/2022 in [facility] Office Visit from 10/26/2021 in [facility] Office Visit from 08/12/2021 in [facility] Office Visit from 07/21/2021 in [facility] Office Visit from 05/26/2020 in [facility]  Total GAD-7 Score 18 14 4 6 18        PHQ2-9    Flowsheet Row Office Visit from 01/30/2022 in [facility] Office Visit from 12/13/2021 in [facility] Office Visit from 10/26/2021 in [facility] Office Visit from 09/21/2021 in [facility] Office Visit from 08/12/2021 in [facility]  PHQ-2 Total Score 4 4 2 4 1   PHQ-9 Total Score 12 12 7 12 5       Flowsheet Row Admission (Discharged) from 02/20/2022 in [facility] Pre-Admission Testing 62 from 02/17/2022 in [facility]  [place] PREADMISSION TESTING Office Visit from 01/30/2022 in [place] No Risk No Risk Error: Q3, 4, or 5 should not be populated when Q2 is No        Assessment and Plan:  Carolyn Wood is a 61 y.o. year old female with a history of  depression, anxiety, hypertension, right hip OA, who presents for follow up appointment for below.      1. MDD (major depressive disorder), recurrent episode, moderate ([place]) She continues to report depressive symptoms and anxiety since switching from venlafaxine to sertraline. Psychosocial stressors includes hip pain, stress at work, conflict with her siblings, loss of her parents several years ago.  Will do further uptitration of sertraline to optimize treatment for depression and anxiety. Will uptitrate judiciously to avoid serotonin syndrome with concomitant use of tramadol.   Plan Increase sertraline 50 mg at night - monitor alexithymia Next appointment: 9/26 at 3 Pm for 30 mins, in person -on tramadol.    The patient demonstrates the following risk factors for suicide: Chronic risk factors for suicide include: psychiatric disorder of depression and chronic pain. Acute risk factors for suicide include: loss (financial, interpersonal, professional). Protective factors for this patient include: coping skills and hope for the future.  Considering these factors, the overall suicide risk at this point appears to be low. Patient is appropriate for outpatient follow up.     Collaboration of Care: Collaboration of Care: {BH OP Collaboration of Care:[id number]}  Patient/Guardian was advised Release of Information must be obtained prior to any record release in order to collaborate their care with an outside provider. Patient/Guardian was advised if they have not already done so to contact the registration department to sign all necessary forms in order for [place] to release information regarding their care.   Consent: Patient/Guardian gives verbal consent for treatment and assignment of benefits for services provided during this visit. Patient/Guardian expressed understanding and agreed to proceed.    Norman Clay, MD 03/18/2022, 4:32 PM

## 2022-03-21 ENCOUNTER — Ambulatory Visit: Payer: BC Managed Care – PPO | Admitting: Psychiatry

## 2022-04-03 ENCOUNTER — Ambulatory Visit: Payer: Self-pay | Admitting: Dermatology

## 2022-04-03 DIAGNOSIS — L988 Other specified disorders of the skin and subcutaneous tissue: Secondary | ICD-10-CM

## 2022-04-03 DIAGNOSIS — I781 Nevus, non-neoplastic: Secondary | ICD-10-CM

## 2022-04-03 NOTE — Patient Instructions (Signed)
BEFORE YOUR APPOINTMENT FOR SCLEROTHERAPY  1. When you telephone for your appointment for the sclerotherapy procedure, please let the receptionist know that you are scheduling for the fifteen (15) minute sclerotherapy procedure not just a regular visit.  2. On the day of the procedure, please cleanse and dry the areas, but do not use any moisturizers or other products on the area(s) to be treated.  3. Bring a pair of comfortable shorts to wear during the procedure.  4. Be sure to bring your recommended graduated compression stockings with you to the office. You will be wearing them home when your visit is over. These compression hose can be purchased at most medical supply stores.  After Your Sclerotherapy Procedure  1. Please wear the graduated compression stockings for 24 hours immediately following the completion of the sclerotherapy procedure.  2. We recommend that you avoid vigorous activity as much as possible for the first twenty-four (24) hours. You can do your "normal" routine, but avoid an above normal amount of time on your feet. Elevating the legs when sitting and avoidance of vigorous leg movements or exercise in the first few days after treatment may improve your results.  3. You may remove the compression dressings (cotton balls) and tape the next morning.  4. Please continue wearing the compression stockings during waking hours for the two (2) weeks following sclerotherapy.  5. If you have any blisters, sores or ulcers or other problems following your procedure please call or return to the office immediately.     THE PROCEDURE FEE IS $350.00 PER FIFTEEN (15) MINUTE SESSION. WE REQUIRE THAT THIS PROCEDURE BE PAID FOR IN FULL ON OR BEFORE THE DATE THAT IT IS PERFORMED. WE WILL GIVE YOU A RECEIPT THAT YOU CAN FILE WITH YOUR INSURANCE COMPANY. WE GENERALLY DO NOT FILE THIS PROCEDURE WITH ANY INSURANCE COMPANY EXCEPT UNDER CERTAIN CIRCUMSTANCES WHERE PRIOR AUTHORIZATION HAS BEEN  CONFIRMED. THIS PROCEDURE IS GENERALLY CONSIDERED TO BE A COSMETIC PROCEDURE BY INSURANCE COMPANIES.    Due to recent changes in healthcare laws, you may see results of your pathology and/or laboratory studies on MyChart before the doctors have had a chance to review them. We understand that in some cases there may be results that are confusing or concerning to you. Please understand that not all results are received at the same time and often the doctors may need to interpret multiple results in order to provide you with the best plan of care or course of treatment. Therefore, we ask that you please give us 2 business days to thoroughly review all your results before contacting the office for clarification. Should we see a critical lab result, you will be contacted sooner.   If You Need Anything After Your Visit  If you have any questions or concerns for your doctor, please call our main line at [phone] and press option 4 to reach your doctor's medical assistant. If no one answers, please leave a voicemail as directed and we will return your call as soon as possible. Messages left after 4 pm will be answered the following business day.   You may also send us a message via MyChart. We typically respond to MyChart messages within 1-2 business days.  For prescription refills, please ask your pharmacy to contact our office. Our fax number is [phone].  If you have an urgent issue when the clinic is closed that cannot wait until the next business day, you can page your doctor at the number below.      Please note that while we do our best to be available for urgent issues outside of office hours, we are not available 24/7.   If you have an urgent issue and are unable to reach us, you may choose to seek medical care at your doctor's office, retail clinic, urgent care center, or emergency room.  If you have a medical emergency, please immediately call 911 or go to the emergency  department.  Pager Numbers  - Dr. Kowalski: [phone]  - Dr. Moye: [phone]  - Dr. Stewart: [phone]  In the event of inclement weather, please call our main line at [phone] for an update on the status of any delays or closures.  Dermatology Medication Tips: Please keep the boxes that topical medications come in in order to help keep track of the instructions about where and how to use these. Pharmacies typically print the medication instructions only on the boxes and not directly on the medication tubes.   If your medication is too expensive, please contact our office at [phone] option 4 or send us a message through MyChart.   We are unable to tell what your co-pay for medications will be in advance as this is different depending on your insurance coverage. However, we may be able to find a substitute medication at lower cost or fill out paperwork to get insurance to cover a needed medication.   If a prior authorization is required to get your medication covered by your insurance company, please allow us 1-2 business days to complete this process.  Drug prices often vary depending on where the prescription is filled and some pharmacies may offer cheaper prices.  The website www.goodrx.com contains coupons for medications through different pharmacies. The prices here do not account for what the cost may be with help from insurance (it may be cheaper with your insurance), but the website can give you the price if you did not use any insurance.  - You can print the associated coupon and take it with your prescription to the pharmacy.  - You may also stop by our office during regular business hours and pick up a GoodRx coupon card.  - If you need your prescription sent electronically to a different pharmacy, notify our office through [place] MyChart or by phone at [phone] option 4.     Si Usted Necesita Algo Despus de Su Visita  Tambin puede enviarnos un  mensaje a travs de MyChart. Por lo general respondemos a los mensajes de MyChart en el transcurso de 1 a 2 das hbiles.  Para renovar recetas, por favor pida a su farmacia que se ponga en contacto con nuestra oficina. Nuestro nmero de fax es el [phone].  Si tiene un asunto urgente cuando la clnica est cerrada y que no puede esperar hasta el siguiente da hbil, puede llamar/localizar a su doctor(a) al nmero que aparece a continuacin.   Por favor, tenga en cuenta que aunque hacemos todo lo posible para estar disponibles para asuntos urgentes fuera del horario de oficina, no estamos disponibles las 24 horas del da, los 7 das de la semana.   Si tiene un problema urgente y no puede comunicarse con nosotros, puede optar por buscar atencin mdica  en el consultorio de su doctor(a), en una clnica privada, en un centro de atencin urgente o en una sala de emergencias.  Si tiene una emergencia mdica, por favor llame inmediatamente al 911 o vaya a la sala de emergencias.  Nmeros de bper  - Dr. Kowalski:   [phone]  - Dra. Moye: [phone]  - Dra. Stewart: [phone]  En caso de inclemencias del tiempo, por favor llame a nuestra lnea principal al [phone] para una actualizacin sobre el estado de cualquier retraso o cierre.  Consejos para la medicacin en dermatologa: Por favor, guarde las cajas en las que vienen los medicamentos de uso tpico para ayudarle a seguir las instrucciones sobre dnde y cmo usarlos. Las farmacias generalmente imprimen las instrucciones del medicamento slo en las cajas y no directamente en los tubos del medicamento.   Si su medicamento es muy caro, por favor, pngase en contacto con nuestra oficina llamando al [phone] y presione la opcin 4 o envenos un mensaje a travs de MyChart.   No podemos decirle cul ser su copago por los medicamentos por adelantado ya que esto es diferente dependiendo de la cobertura de su seguro. Sin embargo,  es posible que podamos encontrar un medicamento sustituto a menor costo o llenar un formulario para que el seguro cubra el medicamento que se considera necesario.   Si se requiere una autorizacin previa para que su compaa de seguros cubra su medicamento, por favor permtanos de 1 a 2 das hbiles para completar este proceso.  Los precios de los medicamentos varan con frecuencia dependiendo del lugar de dnde se surte la receta y alguna farmacias pueden ofrecer precios ms baratos.  El sitio web www.goodrx.com tiene cupones para medicamentos de diferentes farmacias. Los precios aqu no tienen en cuenta lo que podra costar con la ayuda del seguro (puede ser ms barato con su seguro), pero el sitio web puede darle el precio si no utiliz ningn seguro.  - Puede imprimir el cupn correspondiente y llevarlo con su receta a la farmacia.  - Tambin puede pasar por nuestra oficina durante el horario de atencin regular y recoger una tarjeta de cupones de GoodRx.  - Si necesita que su receta se enve electrnicamente a una farmacia diferente, informe a nuestra oficina a travs de MyChart de [facility] o por telfono llamando al [phone] y presione la opcin 4.  

## 2022-04-03 NOTE — Progress Notes (Signed)
i  Follow-Up Visit   Subjective  Carolyn Wood is a 61 y.o. 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   The following portions of the chart were reviewed this encounter and updated as appropriate:       Review of Systems:  No other skin or systemic complaints except as noted in HPI or Assessment and Plan.  Objective  Well appearing patient in no apparent distress; mood and affect are within normal limits.  A focused examination was performed including bol legs. Relevant physical exam findings are noted in the Assessment and Plan.  R upper thigh, L upper thigh Telangiectasias bil upper thighs. R lat ankle         Head - Anterior (Face) Rhytides and volume loss.     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  Procedure: The patient was counseled and understands about the effects, side effects and potential risks and complications of the sclerotherapy procedure. The patient was given the opportunity to ask questions. Asclera (polidocanol) 1% (total 2cc) was injected into the varices. In order to ensure correct placement of the catheter in the vein, I drew back slightly to give moderate blood show in the syringe. If there was any evidence or suspicion of extravasation of sclerosant, the area was immediately diluted with a large volume of 0.9% saline. A pressure dressing was applied immediately to the injected sites. The patient tolerated the procedure well without complication. The patient was instructed in post-operative compression stocking use. The patient understands to call or return immediately if any problems  noted.   Elastosis of skin Head - Anterior (Face)  Discussed Botox to frown complex and forehead Discussed filler to lips, nasolabial folds and mid face Restylane Defyne for nasolabial folds Volbella or Restylane Refyne for lips Voluma for mid face   Return for to be scheduled for Botox/fillers.  I, Othelia Pulling, RMA, am acting as scribe for Brendolyn Patty, MD .  Documentation: I have reviewed the above documentation for accuracy and completeness, and I agree with the above.  Brendolyn Patty MD

## 2022-04-24 ENCOUNTER — Encounter (INDEPENDENT_AMBULATORY_CARE_PROVIDER_SITE_OTHER): Payer: Self-pay

## 2022-04-26 ENCOUNTER — Telehealth: Payer: Self-pay

## 2022-04-26 NOTE — Telephone Encounter (Signed)
Spoke to pt about upcoming cosmetic appt on 05/22/22.  Advised with fillers to the lips we recommend starting Valtrex 500mg  1 po bid for 7 days starting 1 day prior to procedure.  Pt said she was not sure if she would do the lips or not.  She does have Valtrex 500mg  at home and I advised to start taking 1 day prior to appointment just incase she does have her lips treated./sh

## 2022-05-22 ENCOUNTER — Ambulatory Visit (INDEPENDENT_AMBULATORY_CARE_PROVIDER_SITE_OTHER): Payer: BC Managed Care – PPO | Admitting: Dermatology

## 2022-05-22 DIAGNOSIS — L988 Other specified disorders of the skin and subcutaneous tissue: Secondary | ICD-10-CM

## 2022-05-22 NOTE — Progress Notes (Signed)
   Follow-Up Visit   Subjective  Carolyn Wood is a 61 y.o. female who presents for the following: Facial Elastosis (Filler to nasolabial and oral commissure areas and discuss Botox).    The following portions of the chart were reviewed this encounter and updated as appropriate:       Review of Systems:  No other skin or systemic complaints except as noted in HPI or Assessment and Plan.  Objective  Well appearing patient in no apparent distress; mood and affect are within normal limits.  A focused examination was performed including face. Relevant physical exam findings are noted in the Assessment and Plan.  Face Rhytides and volume loss.                          Assessment & Plan  Elastosis of skin Face  Discussed facial elastosis of forehead - recommend waiting another 1-2 months then reevaluate Botox needs after what she has had done 2 mos ago wears off.  Discussed Restylae Defyne to oral commissure and nasolabial areas - advised patient she would likely need one syringe per side to do both areas. Patient prefers to do one syringe today so will concentrate on oral commissure areas today.  Filling material injection - Face Prior to the procedure, the patient's past medical history, allergies and the rare but potential risks and complications were reviewed with the patient and a signed consent was obtained. Pre and post-treatment care was discussed and instructions provided.  Location: marionette lines  Filler Type: Restylane Defyne Lot #[id number] Exp 09/24/2022  Procedure: The area was prepped thoroughly with Puracyn. After introducing the needle into the desired treatment area, the syringe plunger was drawn back to ensure there was no flash of blood prior to injecting the filler in order to minimize risk of intravascular injection and vascular occlusion.    Patient tolerated the procedure well. The patient will call with any problems, questions or  concerns prior to their next appointment.    Return in about 2 months (around 07/22/2022) for Botox.  I, Joanie Coddington, CMA, am acting as scribe for Willeen Niece, MD .  Documentation: I have reviewed the above documentation for accuracy and completeness, and I agree with the above.  Willeen Niece MD

## 2022-05-22 NOTE — Patient Instructions (Signed)
Due to recent changes in healthcare laws, you may see results of your pathology and/or laboratory studies on MyChart before the doctors have had a chance to review them. We understand that in some cases there may be results that are confusing or concerning to you. Please understand that not all results are received at the same time and often the doctors may need to interpret multiple results in order to provide you with the best plan of care or course of treatment. Therefore, we ask that you please give us 2 business days to thoroughly review all your results before contacting the office for clarification. Should we see a critical lab result, you will be contacted sooner.   If You Need Anything After Your Visit  If you have any questions or concerns for your doctor, please call our main line at [phone] and press option 4 to reach your doctor's medical assistant. If no one answers, please leave a voicemail as directed and we will return your call as soon as possible. Messages left after 4 pm will be answered the following business day.   You may also send us a message via MyChart. We typically respond to MyChart messages within 1-2 business days.  For prescription refills, please ask your pharmacy to contact our office. Our fax number is [phone].  If you have an urgent issue when the clinic is closed that cannot wait until the next business day, you can page your doctor at the number below.    Please note that while we do our best to be available for urgent issues outside of office hours, we are not available 24/7.   If you have an urgent issue and are unable to reach us, you may choose to seek medical care at your doctor's office, retail clinic, urgent care center, or emergency room.  If you have a medical emergency, please immediately call 911 or go to the emergency department.  Pager Numbers  - Dr. Kowalski: [phone]  - Dr. Moye: [phone]  - Dr. Stewart:  [phone]  In the event of inclement weather, please call our main line at [phone] for an update on the status of any delays or closures.  Dermatology Medication Tips: Please keep the boxes that topical medications come in in order to help keep track of the instructions about where and how to use these. Pharmacies typically print the medication instructions only on the boxes and not directly on the medication tubes.   If your medication is too expensive, please contact our office at [phone] option 4 or send us a message through MyChart.   We are unable to tell what your co-pay for medications will be in advance as this is different depending on your insurance coverage. However, we may be able to find a substitute medication at lower cost or fill out paperwork to get insurance to cover a needed medication.   If a prior authorization is required to get your medication covered by your insurance company, please allow us 1-2 business days to complete this process.  Drug prices often vary depending on where the prescription is filled and some pharmacies may offer cheaper prices.  The website www.goodrx.com contains coupons for medications through different pharmacies. The prices here do not account for what the cost may be with help from insurance (it may be cheaper with your insurance), but the website can give you the price if you did not use any insurance.  - You can print the associated coupon and take it with   your prescription to the pharmacy.  - You may also stop by our office during regular business hours and pick up a GoodRx coupon card.  - If you need your prescription sent electronically to a different pharmacy, notify our office through [place] MyChart or by phone at [phone] option 4.     Si Usted Necesita Algo Despus de Su Visita  Tambin puede enviarnos un mensaje a travs de MyChart. Por lo general respondemos a los mensajes de MyChart en el transcurso de 1 a 2  das hbiles.  Para renovar recetas, por favor pida a su farmacia que se ponga en contacto con nuestra oficina. Nuestro nmero de fax es el [phone].  Si tiene un asunto urgente cuando la clnica est cerrada y que no puede esperar hasta el siguiente da hbil, puede llamar/localizar a su doctor(a) al nmero que aparece a continuacin.   Por favor, tenga en cuenta que aunque hacemos todo lo posible para estar disponibles para asuntos urgentes fuera del horario de oficina, no estamos disponibles las 24 horas del da, los 7 das de la semana.   Si tiene un problema urgente y no puede comunicarse con nosotros, puede optar por buscar atencin mdica  en el consultorio de su doctor(a), en una clnica privada, en un centro de atencin urgente o en una sala de emergencias.  Si tiene una emergencia mdica, por favor llame inmediatamente al 911 o vaya a la sala de emergencias.  Nmeros de bper  - Dr. Kowalski: [phone]  - Dra. Moye: [phone]  - Dra. Stewart: [phone]  En caso de inclemencias del tiempo, por favor llame a nuestra lnea principal al [phone] para una actualizacin sobre el estado de cualquier retraso o cierre.  Consejos para la medicacin en dermatologa: Por favor, guarde las cajas en las que vienen los medicamentos de uso tpico para ayudarle a seguir las instrucciones sobre dnde y cmo usarlos. Las farmacias generalmente imprimen las instrucciones del medicamento slo en las cajas y no directamente en los tubos del medicamento.   Si su medicamento es muy caro, por favor, pngase en contacto con nuestra oficina llamando al [phone] y presione la opcin 4 o envenos un mensaje a travs de MyChart.   No podemos decirle cul ser su copago por los medicamentos por adelantado ya que esto es diferente dependiendo de la cobertura de su seguro. Sin embargo, es posible que podamos encontrar un medicamento sustituto a menor costo o llenar un formulario para que el  seguro cubra el medicamento que se considera necesario.   Si se requiere una autorizacin previa para que su compaa de seguros cubra su medicamento, por favor permtanos de 1 a 2 das hbiles para completar este proceso.  Los precios de los medicamentos varan con frecuencia dependiendo del lugar de dnde se surte la receta y alguna farmacias pueden ofrecer precios ms baratos.  El sitio web www.goodrx.com tiene cupones para medicamentos de diferentes farmacias. Los precios aqu no tienen en cuenta lo que podra costar con la ayuda del seguro (puede ser ms barato con su seguro), pero el sitio web puede darle el precio si no utiliz ningn seguro.  - Puede imprimir el cupn correspondiente y llevarlo con su receta a la farmacia.  - Tambin puede pasar por nuestra oficina durante el horario de atencin regular y recoger una tarjeta de cupones de GoodRx.  - Si necesita que su receta se enve electrnicamente a una farmacia diferente, informe a nuestra oficina a travs de MyChart de [facility]   o por telfono llamando al [phone] y presione la opcin 4.  

## 2022-07-24 ENCOUNTER — Ambulatory Visit (INDEPENDENT_AMBULATORY_CARE_PROVIDER_SITE_OTHER): Payer: Self-pay | Admitting: Dermatology

## 2022-07-24 DIAGNOSIS — L988 Other specified disorders of the skin and subcutaneous tissue: Secondary | ICD-10-CM

## 2022-07-24 NOTE — Patient Instructions (Signed)
Due to recent changes in healthcare laws, you may see results of your pathology and/or laboratory studies on MyChart before the doctors have had a chance to review them. We understand that in some cases there may be results that are confusing or concerning to you. Please understand that not all results are received at the same time and often the doctors may need to interpret multiple results in order to provide you with the best plan of care or course of treatment. Therefore, we ask that you please give us 2 business days to thoroughly review all your results before contacting the office for clarification. Should we see a critical lab result, you will be contacted sooner.   If You Need Anything After Your Visit  If you have any questions or concerns for your doctor, please call our main line at [phone] and press option 4 to reach your doctor's medical assistant. If no one answers, please leave a voicemail as directed and we will return your call as soon as possible. Messages left after 4 pm will be answered the following business day.   You may also send us a message via MyChart. We typically respond to MyChart messages within 1-2 business days.  For prescription refills, please ask your pharmacy to contact our office. Our fax number is [phone].  If you have an urgent issue when the clinic is closed that cannot wait until the next business day, you can page your doctor at the number below.    Please note that while we do our best to be available for urgent issues outside of office hours, we are not available 24/7.   If you have an urgent issue and are unable to reach us, you may choose to seek medical care at your doctor's office, retail clinic, urgent care center, or emergency room.  If you have a medical emergency, please immediately call 911 or go to the emergency department.  Pager Numbers  - Dr. Kowalski: [phone]  - Dr. Moye: [phone]  - Dr. Stewart:  [phone]  In the event of inclement weather, please call our main line at [phone] for an update on the status of any delays or closures.  Dermatology Medication Tips: Please keep the boxes that topical medications come in in order to help keep track of the instructions about where and how to use these. Pharmacies typically print the medication instructions only on the boxes and not directly on the medication tubes.   If your medication is too expensive, please contact our office at [phone] option 4 or send us a message through MyChart.   We are unable to tell what your co-pay for medications will be in advance as this is different depending on your insurance coverage. However, we may be able to find a substitute medication at lower cost or fill out paperwork to get insurance to cover a needed medication.   If a prior authorization is required to get your medication covered by your insurance company, please allow us 1-2 business days to complete this process.  Drug prices often vary depending on where the prescription is filled and some pharmacies may offer cheaper prices.  The website www.goodrx.com contains coupons for medications through different pharmacies. The prices here do not account for what the cost may be with help from insurance (it may be cheaper with your insurance), but the website can give you the price if you did not use any insurance.  - You can print the associated coupon and take it with   your prescription to the pharmacy.  - You may also stop by our office during regular business hours and pick up a GoodRx coupon card.  - If you need your prescription sent electronically to a different pharmacy, notify our office through [facility] MyChart or by phone at [phone] option 4.     Si Usted Necesita Algo Despus de Su Visita  Tambin puede enviarnos un mensaje a travs de MyChart. Por lo general respondemos a los mensajes de MyChart en el transcurso de 1 a 2  das hbiles.  Para renovar recetas, por favor pida a su farmacia que se ponga en contacto con nuestra oficina. Nuestro nmero de fax es el [phone].  Si tiene un asunto urgente cuando la clnica est cerrada y que no puede esperar hasta el siguiente da hbil, puede llamar/localizar a su doctor(a) al nmero que aparece a continuacin.   Por favor, tenga en cuenta que aunque hacemos todo lo posible para estar disponibles para asuntos urgentes fuera del horario de oficina, no estamos disponibles las 24 horas del da, los 7 das de la semana.   Si tiene un problema urgente y no puede comunicarse con nosotros, puede optar por buscar atencin mdica  en el consultorio de su doctor(a), en una clnica privada, en un centro de atencin urgente o en una sala de emergencias.  Si tiene una emergencia mdica, por favor llame inmediatamente al 911 o vaya a la sala de emergencias.  Nmeros de bper  - Dr. Kowalski: [phone]  - Dra. Moye: [phone]  - Dra. Stewart: [phone]  En caso de inclemencias del tiempo, por favor llame a nuestra lnea principal al [phone] para una actualizacin sobre el estado de cualquier retraso o cierre.  Consejos para la medicacin en dermatologa: Por favor, guarde las cajas en las que vienen los medicamentos de uso tpico para ayudarle a seguir las instrucciones sobre dnde y cmo usarlos. Las farmacias generalmente imprimen las instrucciones del medicamento slo en las cajas y no directamente en los tubos del medicamento.   Si su medicamento es muy caro, por favor, pngase en contacto con nuestra oficina llamando al [phone] y presione la opcin 4 o envenos un mensaje a travs de MyChart.   No podemos decirle cul ser su copago por los medicamentos por adelantado ya que esto es diferente dependiendo de la cobertura de su seguro. Sin embargo, es posible que podamos encontrar un medicamento sustituto a menor costo o llenar un formulario para que el  seguro cubra el medicamento que se considera necesario.   Si se requiere una autorizacin previa para que su compaa de seguros cubra su medicamento, por favor permtanos de 1 a 2 das hbiles para completar este proceso.  Los precios de los medicamentos varan con frecuencia dependiendo del lugar de dnde se surte la receta y alguna farmacias pueden ofrecer precios ms baratos.  El sitio web www.goodrx.com tiene cupones para medicamentos de diferentes farmacias. Los precios aqu no tienen en cuenta lo que podra costar con la ayuda del seguro (puede ser ms barato con su seguro), pero el sitio web puede darle el precio si no utiliz ningn seguro.  - Puede imprimir el cupn correspondiente y llevarlo con su receta a la farmacia.  - Tambin puede pasar por nuestra oficina durante el horario de atencin regular y recoger una tarjeta de cupones de GoodRx.  - Si necesita que su receta se enve electrnicamente a una farmacia diferente, informe a nuestra oficina a travs de MyChart de [place]   o por telfono llamando al [phone] y presione la opcin 4.  

## 2022-07-24 NOTE — Progress Notes (Signed)
   Follow-Up Visit   Subjective  Carolyn Wood is a 62 y.o. 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 ).  Patient has had Botox at another office in the past, last time was September 2023.  The following portions of the chart were reviewed this encounter and updated as appropriate:       Review of Systems:  No other skin or systemic complaints except as noted in HPI or Assessment and Plan.  Objective  Well appearing patient in no apparent distress; mood and affect are within normal limits.  A focused examination was performed including face. Relevant physical exam findings are noted in the Assessment and Plan.  face Rhytides and volume loss.                      Assessment & Plan  Elastosis of skin face  Botox 47.5 units Botox Comma - 1.25 units each side Forehead - 5 units Crows Feet - 10 units each side Frown Complex - 20 units  Filling material injection - face Location: See attached image  Informed consent: Discussed risks (infection, pain, bleeding, bruising, swelling, allergic reaction, paralysis of nearby muscles, eyelid droop, double vision, neck weakness, difficulty breathing, headache, undesirable cosmetic result, and need for additional treatment) and benefits of the procedure, as well as the alternatives.  Informed consent was obtained.  Preparation: The area was cleansed with alcohol.  Procedure Details:  Botox was injected into the dermis with a 30-gauge needle. Pressure applied to any bleeding. Ice packs offered for swelling.  Lot Number:  [id number] Expiration:  08/2024  Total Units Injected:  47.5  Plan: Tylenol may be used for headache.  Allow 2 weeks before returning to clinic for additional dosing as needed. Patient will call for any problems.    Return if symptoms worsen or fail to improve.  Graciella Belton, RMA, am acting as scribe for Brendolyn Patty, MD  .  Documentation: I have reviewed the above documentation for accuracy and completeness, and I agree with the above.  Brendolyn Patty MD

## 2022-11-13 ENCOUNTER — Encounter: Payer: Self-pay | Admitting: Dermatology

## 2022-11-13 ENCOUNTER — Ambulatory Visit (INDEPENDENT_AMBULATORY_CARE_PROVIDER_SITE_OTHER): Payer: Self-pay | Admitting: Dermatology

## 2022-11-13 VITALS — BP 149/100 | HR 72

## 2022-11-13 DIAGNOSIS — L988 Other specified disorders of the skin and subcutaneous tissue: Secondary | ICD-10-CM

## 2022-11-13 NOTE — Patient Instructions (Signed)
Due to recent changes in healthcare laws, you may see results of your pathology and/or laboratory studies on MyChart before the doctors have had a chance to review them. We understand that in some cases there may be results that are confusing or concerning to you. Please understand that not all results are received at the same time and often the doctors may need to interpret multiple results in order to provide you with the best plan of care or course of treatment. Therefore, we ask that you please give us 2 business days to thoroughly review all your results before contacting the office for clarification. Should we see a critical lab result, you will be contacted sooner.   If You Need Anything After Your Visit  If you have any questions or concerns for your doctor, please call our main line at [phone] and press option 4 to reach your doctor's medical assistant. If no one answers, please leave a voicemail as directed and we will return your call as soon as possible. Messages left after 4 pm will be answered the following business day.   You may also send us a message via MyChart. We typically respond to MyChart messages within 1-2 business days.  For prescription refills, please ask your pharmacy to contact our office. Our fax number is [phone].  If you have an urgent issue when the clinic is closed that cannot wait until the next business day, you can page your doctor at the number below.    Please note that while we do our best to be available for urgent issues outside of office hours, we are not available 24/7.   If you have an urgent issue and are unable to reach us, you may choose to seek medical care at your doctor's office, retail clinic, urgent care center, or emergency room.  If you have a medical emergency, please immediately call 911 or go to the emergency department.  Pager Numbers  - Dr. Kowalski: [phone]  - Dr. Moye: [phone]  - Dr. Stewart:  [phone]  In the event of inclement weather, please call our main line at [phone] for an update on the status of any delays or closures.  Dermatology Medication Tips: Please keep the boxes that topical medications come in in order to help keep track of the instructions about where and how to use these. Pharmacies typically print the medication instructions only on the boxes and not directly on the medication tubes.   If your medication is too expensive, please contact our office at [phone] option 4 or send us a message through MyChart.   We are unable to tell what your co-pay for medications will be in advance as this is different depending on your insurance coverage. However, we may be able to find a substitute medication at lower cost or fill out paperwork to get insurance to cover a needed medication.   If a prior authorization is required to get your medication covered by your insurance company, please allow us 1-2 business days to complete this process.  Drug prices often vary depending on where the prescription is filled and some pharmacies may offer cheaper prices.  The website www.goodrx.com contains coupons for medications through different pharmacies. The prices here do not account for what the cost may be with help from insurance (it may be cheaper with your insurance), but the website can give you the price if you did not use any insurance.  - You can print the associated coupon and take it with   your prescription to the pharmacy.  - You may also stop by our office during regular business hours and pick up a GoodRx coupon card.  - If you need your prescription sent electronically to a different pharmacy, notify our office through [facility] MyChart or by phone at [phone] option 4.     Si Usted Necesita Algo Despus de Su Visita  Tambin puede enviarnos un mensaje a travs de MyChart. Por lo general respondemos a los mensajes de MyChart en el transcurso de 1 a 2  das hbiles.  Para renovar recetas, por favor pida a su farmacia que se ponga en contacto con nuestra oficina. Nuestro nmero de fax es el [phone].  Si tiene un asunto urgente cuando la clnica est cerrada y que no puede esperar hasta el siguiente da hbil, puede llamar/localizar a su doctor(a) al nmero que aparece a continuacin.   Por favor, tenga en cuenta que aunque hacemos todo lo posible para estar disponibles para asuntos urgentes fuera del horario de oficina, no estamos disponibles las 24 horas del da, los 7 das de la semana.   Si tiene un problema urgente y no puede comunicarse con nosotros, puede optar por buscar atencin mdica  en el consultorio de su doctor(a), en una clnica privada, en un centro de atencin urgente o en una sala de emergencias.  Si tiene una emergencia mdica, por favor llame inmediatamente al 911 o vaya a la sala de emergencias.  Nmeros de bper  - Dr. Kowalski: [phone]  - Dra. Moye: [phone]  - Dra. Stewart: [phone]  En caso de inclemencias del tiempo, por favor llame a nuestra lnea principal al [phone] para una actualizacin sobre el estado de cualquier retraso o cierre.  Consejos para la medicacin en dermatologa: Por favor, guarde las cajas en las que vienen los medicamentos de uso tpico para ayudarle a seguir las instrucciones sobre dnde y cmo usarlos. Las farmacias generalmente imprimen las instrucciones del medicamento slo en las cajas y no directamente en los tubos del medicamento.   Si su medicamento es muy caro, por favor, pngase en contacto con nuestra oficina llamando al [phone] y presione la opcin 4 o envenos un mensaje a travs de MyChart.   No podemos decirle cul ser su copago por los medicamentos por adelantado ya que esto es diferente dependiendo de la cobertura de su seguro. Sin embargo, es posible que podamos encontrar un medicamento sustituto a menor costo o llenar un formulario para que el  seguro cubra el medicamento que se considera necesario.   Si se requiere una autorizacin previa para que su compaa de seguros cubra su medicamento, por favor permtanos de 1 a 2 das hbiles para completar este proceso.  Los precios de los medicamentos varan con frecuencia dependiendo del lugar de dnde se surte la receta y alguna farmacias pueden ofrecer precios ms baratos.  El sitio web www.goodrx.com tiene cupones para medicamentos de diferentes farmacias. Los precios aqu no tienen en cuenta lo que podra costar con la ayuda del seguro (puede ser ms barato con su seguro), pero el sitio web puede darle el precio si no utiliz ningn seguro.  - Puede imprimir el cupn correspondiente y llevarlo con su receta a la farmacia.  - Tambin puede pasar por nuestra oficina durante el horario de atencin regular y recoger una tarjeta de cupones de GoodRx.  - Si necesita que su receta se enve electrnicamente a una farmacia diferente, informe a nuestra oficina a travs de MyChart de [facility]   o por telfono llamando al [phone] y presione la opcin 4.  

## 2022-11-13 NOTE — Progress Notes (Signed)
   Follow-Up Visit   Subjective  Carolyn Wood is a 62 y.o. female who presents for the following: Botox for facial elastosis  The following portions of the chart were reviewed this encounter and updated as appropriate: medications, allergies, medical history  Review of Systems:  No other skin or systemic complaints except as noted in HPI or Assessment and Plan.  Objective  Well appearing patient in no apparent distress; mood and affect are within normal limits.  A focused examination was performed of the face.  Relevant physical exam findings are noted in the Assessment and Plan.      Assessment & Plan    Facial Elastosis  Location: See attached image, glabella, crow's feet, forehead, comma, lateral brow lift  Informed consent: Discussed risks (infection, pain, bleeding, bruising, swelling, allergic reaction, paralysis of nearby muscles, eyelid droop, double vision, neck weakness, difficulty breathing, headache, undesirable cosmetic result, and need for additional treatment) and benefits of the procedure, as well as the alternatives.  Informed consent was obtained.  Preparation: The area was cleansed with alcohol.  Procedure Details:  Botox was injected into the dermis with a 30-gauge needle. Pressure applied to any bleeding. Ice packs offered for swelling.  Lot Number:  [id number] Expiration:  10/2024  Total Units Injected:  50  Plan: Tylenol may be used for headache.  Allow 2 weeks before returning to clinic for additional dosing as needed. Patient will call for any problems.  Return for 4 month botox .  I, Asher Muir, CMA, am acting as scribe for Willeen Niece, MD.   Documentation: I have reviewed the above documentation for accuracy and completeness, and I agree with the above.  Willeen Niece, MD

## 2023-03-19 ENCOUNTER — Ambulatory Visit: Payer: BC Managed Care – PPO | Admitting: Dermatology

## 2024-05-26 ENCOUNTER — Ambulatory Visit: Payer: Self-pay

## 2024-05-26 NOTE — Telephone Encounter (Signed)
   Reason for Disposition  Earache  (Exceptions: Brief ear pain of lasting less than 60 minutes, or earache occurring during air travel.)  Answer Assessment - Initial Assessment Questions Earache x1 week, progressively worse. Left ear, about a week ago used a qtip to dry out ear to put her hearing aid in and noticed black wax on qtip. She sttes some swelling in her jaw. Denies swelling or redness behind her ear. States she has tried sleeping on heating pad, 3 days of left over atbx, cipro drops which made it worse, and decongestants.      1. LOCATION: Which ear is involved?     Left ear 2. ONSET: When did the ear pain start?      About a week 3. SEVERITY: How bad is the pain?  (Scale 1-10; mild, moderate or severe)     6/10 4. FEVER: Do you have a fever? If Yes, ask: What is your temperature, how was it measured, and when did it start?     no 6. CAUSE: Have you been swimming recently?, How often do you use Q-TIPS?, Have you had any recent air travel or scuba diving?     unknown 7. OTHER SYMPTOMS: Do you have any other symptoms? (e.g., decreased hearing, dizziness, headache, stiff neck, vomiting)     Decreased hearing more than normal  Protocols used: Earache-A-AH

## 2024-05-26 NOTE — Telephone Encounter (Signed)
 Call dropped when transferring from PAS to nurse triage. Attempted to call pt back x1, VM box full and unable to leave VM.   Copied from CRM [id number]. Topic: Clinical - Red Word Triage >> May 26, 2024  9:57 AM Carolyn Wood wrote: Red Word that prompted transfer to Nurse Triage: ear infection, feels like someone stabbing. In extreme pain. Pounding in the head.

## 2024-05-26 NOTE — Telephone Encounter (Signed)
 FYI Only or Action Required?: FYI only for provider: appointment scheduled on 12.2.25.  Patient was last seen in primary care on 11/23/2021 by Melvin Pao, NP.  Called Nurse Triage reporting Otalgia.  Symptoms began a week ago.  Interventions attempted: OTC medications: decongestants and Prescription medications: left over atbx, cipro drops.  Symptoms are: gradually worsening.  Triage Disposition: See Physician Within 24 Hours  Patient/caregiver understands and will follow disposition?: Yes

## 2024-05-27 ENCOUNTER — Ambulatory Visit: Payer: Self-pay | Admitting: Nurse Practitioner
